# Patient Record
Sex: Female | Born: 1990 | Race: Asian | Hispanic: No | Marital: Married | Smoking: Never smoker
Health system: Southern US, Community
[De-identification: ages and names within clinical notes are randomized; demographics above are authoritative.]

## PROBLEM LIST (undated history)

## (undated) DIAGNOSIS — K5120 Ulcerative (chronic) proctitis without complications: Secondary | ICD-10-CM

## (undated) DIAGNOSIS — K529 Noninfective gastroenteritis and colitis, unspecified: Secondary | ICD-10-CM

## (undated) DIAGNOSIS — G5601 Carpal tunnel syndrome, right upper limb: Secondary | ICD-10-CM

## (undated) DIAGNOSIS — R51 Headache: Secondary | ICD-10-CM

## (undated) DIAGNOSIS — N926 Irregular menstruation, unspecified: Secondary | ICD-10-CM

## (undated) DIAGNOSIS — L709 Acne, unspecified: Secondary | ICD-10-CM

## (undated) HISTORY — PX: OTHER SURGICAL HISTORY: SHX169

## (undated) HISTORY — DX: Acne, unspecified: L70.9

## (undated) HISTORY — DX: Ulcerative (chronic) proctitis without complications: K51.20

## (undated) HISTORY — DX: Irregular menstruation, unspecified: N92.6

## (undated) HISTORY — DX: Headache: R51

## (undated) HISTORY — DX: Noninfective gastroenteritis and colitis, unspecified: K52.9

## (undated) HISTORY — DX: Carpal tunnel syndrome, right upper limb: G56.01

---
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
## 2008-12-09 ENCOUNTER — Ambulatory Visit: Payer: Self-pay | Admitting: Internal Medicine

## 2008-12-09 DIAGNOSIS — R079 Chest pain, unspecified: Secondary | ICD-10-CM

## 2009-03-11 ENCOUNTER — Telehealth: Payer: Self-pay | Admitting: *Deleted

## 2009-09-16 ENCOUNTER — Ambulatory Visit: Payer: Self-pay | Admitting: Internal Medicine

## 2010-03-16 ENCOUNTER — Observation Stay (HOSPITAL_COMMUNITY)
Admission: AD | Admit: 2010-03-16 | Discharge: 2010-03-18 | Payer: Self-pay | Source: Home / Self Care | Admitting: Obstetrics and Gynecology

## 2010-04-24 ENCOUNTER — Inpatient Hospital Stay (HOSPITAL_COMMUNITY)
Admission: AD | Admit: 2010-04-24 | Discharge: 2010-04-27 | Payer: Self-pay | Source: Home / Self Care | Attending: Obstetrics & Gynecology | Admitting: Obstetrics & Gynecology

## 2010-06-13 NOTE — Assessment & Plan Note (Signed)
Summary: ?pregnancy/cjr   Vital Signs:  Patient profile:   20 year old female Menstrual status:  irregular LMP:     03/12/2009 Height:      59 inches Weight:      126 pounds BMI:     25.54 Pulse rate:   65 / minute BP sitting:   110 / 70  (right arm) Cuff size:   regular  Vitals Entered By: Sherron Monday, CMA (AAMA) (Sep 16, 2009 3:09 PM) CC: Postive home pg test., + nausea LMP (date): 03/12/2009 LMP - Character: normal Menarche (age onset years): 10   Menses interval (days): 2-3 months Menstrual flow (days): 5 Enter LMP: 03/12/2009   History of Present Illness: Cheryl Walters comes  in comes in today  with partner Lanny Hurst  for above .  LMP end of October but has very irregular periods  often q 3 months .   Last was on OCPs   2009 and stopped ? because .    IN long term relationship without Contraception.   Has had 1 monthsof breast tenderness   on day of spot and did pregnancy test  in th past week that was positive.   ? how far along the preg is .Hasnt seen an ob yet but  continuing pregnancy.  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 no cv pulm signs    Preventive Screening-Counseling & Management  Alcohol-Tobacco     Alcohol drinks/day: 0     Smoking Status: never  Caffeine-Diet-Exercise     Caffeine use/day: 1     Does Patient Exercise: no  Current Medications (verified): 1)  None  Allergies (verified): No Known Drug Allergies  Past History:  Past medical, surgical, family and social histories (including risk factors) reviewed, and no changes noted (except as noted below).  Past Medical History: Reviewed history from 12/09/2008 and no changes required. HAs Pilonidal cyst. Acne  Irreg periods   Past History:  Care Management: None Current  Family History: Reviewed history from 12/09/2008 and no changes required. allergies  DM  irreg periods mom and sib   Social History: Reviewed  history from 12/09/2008 and no changes required.   ..  [employer] .  second year level no tobacco ocass etoh  ocass caffeine.  Living  at home hhof 4  Played basket ball in HS and no Cv pulm signs   Review of Systems  The patient denies anorexia, fever, weight loss, weight gain, vision loss, syncope, prolonged cough, abdominal pain, melena, hematochezia, hematuria, incontinence, genital sores, difficulty walking, depression, and enlarged lymph nodes.    Physical Exam  General:  alert, well-developed, well-nourished, and well-hydrated.   Head:  normocephalic and atraumatic.   Neck:  No deformities, masses, or tenderness noted. Lungs:  Normal respiratory effort, chest expands symmetrically. Lungs are clear to auscultation, no crackles or wheezes. Heart:  Normal rate and regular rhythm. S1 and S2 normal without gallop, murmur, click, rub or other extra sounds. Abdomen:  soft, non-tender, normal bowel sounds, no masses, no guarding, no rigidity,  and no splenomegaly.  dont feel  uterus on abdominal  exam.Or at least at most at pubic bone. Pulses:  nl cap refill  Neurologic:  non focal  Cervical Nodes:  No lymphadenopathy noted Psych:  Oriented X3, normally interactive, good eye contact, not anxious appearing, and not depressed appearing.     Impression & Recommendations:  Problem # 1:  IRREGULAR MENSTRUAL CYCLE (ICD-626.4) hx of same and   off ocps. Orders: Urine Pregnancy Test  (11643)  Problem # 2:  PREGNANCY (ICD-V22.2) Assessment: New confirmed       unsure of dates because of very irreg menses   . disc "contraception failure "  with irreg menses.   ok to do exercise as she would  in past   .      need to see  Ob to assess dates by [place] counseled   prenatal vitamins  with folic acid    no othermeds  ... call us if   need  help with   establishing with ob  greater than 50% of visit spent in counseling  25 minutes. Laboratory Results   Urine Tests      Urine HCG:  positive Comments: Joyce Gross  Sep 16, 2009 3:20 PM

## 2010-07-25 LAB — CBC
HCT: 24.5 % — ABNORMAL LOW (ref 36.0–46.0)
HCT: 37.1 % (ref 36.0–46.0)
HCT: 31.8 % — ABNORMAL LOW (ref 36.0–46.0)
Hemoglobin: 11.9 g/dL — ABNORMAL LOW (ref 12.0–15.0)
Hemoglobin: 8.2 g/dL — ABNORMAL LOW (ref 12.0–15.0)
Hemoglobin: 10.4 g/dL — ABNORMAL LOW (ref 12.0–15.0)
MCH: 26.1 pg (ref 26.0–34.0)
MCH: 26.6 pg (ref 26.0–34.0)
MCHC: 32.8 g/dL (ref 30.0–36.0)
MCV: 78.3 fL (ref 78.0–100.0)
MCV: 78.5 fL (ref 78.0–100.0)
MCV: 81 fL (ref 78.0–100.0)
RBC: 3.13 MIL/uL — ABNORMAL LOW (ref 3.87–5.11)
RBC: 4.73 MIL/uL (ref 3.87–5.11)
RDW: 17.6 % — ABNORMAL HIGH (ref 11.5–15.5)
WBC: 16.8 10*3/uL — ABNORMAL HIGH (ref 4.0–10.5)

## 2010-07-25 LAB — COMPREHENSIVE METABOLIC PANEL
ALT: 11 U/L (ref 0–35)
CO2: 23 mEq/L (ref 19–32)
Calcium: 9.4 mg/dL (ref 8.4–10.5)
Creatinine, Ser: 0.62 mg/dL (ref 0.4–1.2)
GFR calc non Af Amer: >60 mL/min (ref >60)
Glucose, Bld: 87 mg/dL (ref 70–99)
Total Bilirubin: 0.4 mg/dL (ref 0.3–1.2)

## 2010-07-25 LAB — DIFFERENTIAL
Basophils Absolute: 0 10*3/uL (ref 0.0–0.1)
Eosinophils Absolute: 0.1 10*3/uL (ref 0.0–0.7)
Lymphocytes Relative: 29 % (ref 12–46)
Lymphs Abs: 3.2 10*3/uL (ref 0.7–4.0)
Neutrophils Relative %: 59 % (ref 43–77)

## 2010-07-25 LAB — URINE CULTURE
Colony Count: 50000
Culture  Setup Time: 201111032117

## 2010-07-25 LAB — STREP B DNA PROBE

## 2010-07-25 LAB — GC/CHLAMYDIA PROBE AMP, URINE: GC Probe Amp, Urine: NEGATIVE

## 2010-09-22 ENCOUNTER — Encounter: Payer: Self-pay | Admitting: Internal Medicine

## 2010-09-26 ENCOUNTER — Ambulatory Visit: Payer: Self-pay | Admitting: Internal Medicine

## 2010-10-06 ENCOUNTER — Ambulatory Visit (INDEPENDENT_AMBULATORY_CARE_PROVIDER_SITE_OTHER): Payer: BC Managed Care – PPO | Admitting: Internal Medicine

## 2010-10-06 ENCOUNTER — Encounter: Payer: Self-pay | Admitting: Internal Medicine

## 2010-10-06 VITALS — BP 108/72 | HR 86 | Temp 98.6°F | Wt 155.0 lb

## 2010-10-06 DIAGNOSIS — Z3009 Encounter for other general counseling and advice on contraception: Secondary | ICD-10-CM

## 2010-10-06 DIAGNOSIS — Z30011 Encounter for initial prescription of contraceptive pills: Secondary | ICD-10-CM

## 2010-10-06 MED ORDER — NORETHIN ACE-ETH ESTRAD-FE 1-20 MG-MCG PO TABS: 1.0000 | ORAL_TABLET | Freq: Every day | ORAL | Status: DC

## 2010-10-06 NOTE — Progress Notes (Signed)
  Subjective:    Patient ID: Cheryl Walters, female    DOB: 1990-11-13, 20 y.o.   MRN: [id number]  HPI Patient comes in today about oral contraception. She had childbirth in January is interested in going back on an birth control pill. In the past she  Used yasmin  . No problems with this.    Just got off menses.   Has had 2 periods post partum.   Interested in progesterone implants. But hasn't been able to time this right.to get this procedure.  No tobacco, no history of blood clots, unusual chest pain ,shortness of breath.   Review of Systems New changes in vision unusual rashes leg swelling breast tenderness. Her baby is being bottle fed. Her periods had been a regular in the past. She denies contraceptive failure.    Objective:   Physical Exam Well-developed well-nourished in no acute distress holding her baby Neck no thyromegaly or masses Chest:  Clear to A&P without wheezes rales or rhonchi CV:  S1-S2 no gallops or murmurs peripheral perfusion is normal     Assessment & Plan:  Contraceptive need. Acceptable candidate for combined OCP's. Reviewed the data about the very slight increased risk of clotting on newer progesterone's. Counseled. rov in 2-3 months for bp check etc.   She can't time this at the same time with her well-child check for her infant. She's not interested in the IUD but still may get the progesterone implant in the meantime. Reviewed importance of taking medication I regular basis same time and day.  Total visit 103mns > 50% spent counseling and coordinating care

## 2010-10-07 ENCOUNTER — Encounter: Payer: Self-pay | Admitting: Internal Medicine

## 2010-10-07 DIAGNOSIS — Z30011 Encounter for initial prescription of contraceptive pills: Secondary | ICD-10-CM | POA: Insufficient documentation

## 2010-10-20 ENCOUNTER — Telehealth: Payer: Self-pay | Admitting: *Deleted

## 2010-10-20 NOTE — Telephone Encounter (Signed)
Pt states that when she started the OCP's she was having burning upon urination but after stopping them the burning stopped. Pt is not having any symptoms now. She is wondering if we could change the OCP's.

## 2010-10-20 NOTE — Telephone Encounter (Signed)
Pt states that she was not having any other symptoms now.

## 2010-10-20 NOTE — Telephone Encounter (Signed)
It would be extremely unusual for her symptoms short lived to be from the birth control pill.  However if she wants [place] to change it we can.  Aviane  28 days ( Or equivalent ) 1 po qd  Disp  1 refill x 5   Call us with issues  If she has recurrent dysuria we should see her and check for UTI.

## 2010-10-23 NOTE — Telephone Encounter (Signed)
Left message to call back  

## 2010-10-24 MED ORDER — LEVONORGESTREL-ETHINYL ESTRAD 0.1-20 MG-MCG PO TABS: 1.0000 | ORAL_TABLET | Freq: Every day | ORAL | Status: DC

## 2010-10-24 NOTE — Telephone Encounter (Signed)
Pt aware and wants a new rx called in. Pt states that when she d/c her pills the symptoms stopped.

## 2011-02-14 ENCOUNTER — Ambulatory Visit (INDEPENDENT_AMBULATORY_CARE_PROVIDER_SITE_OTHER): Payer: BC Managed Care – PPO | Admitting: Family Medicine

## 2011-02-14 ENCOUNTER — Encounter: Payer: Self-pay | Admitting: Family Medicine

## 2011-02-14 DIAGNOSIS — D179 Benign lipomatous neoplasm, unspecified: Secondary | ICD-10-CM

## 2011-02-14 NOTE — Progress Notes (Signed)
  Subjective:    Patient ID: Cheryl Walters, female    DOB: May 21, 1990, 20 y.o.   MRN: [id number]  HPI Here to talk about a swollen area on the left lower chest which she has noticed for a year or so. Occasionally this becomes painful, as it did yesterday. When this happens it lasts for only minutes or hours and then goes away. No recent trauma. No SOB.    Review of Systems  Constitutional: Negative.   Respiratory: Negative.   Cardiovascular: Positive for chest pain. Negative for palpitations and leg swelling.       Objective:   Physical Exam  Constitutional: She appears well-developed and well-nourished.  Cardiovascular: Normal rate, regular rhythm, normal heart sounds and intact distal pulses.   Pulmonary/Chest: Effort normal and breath sounds normal. No respiratory distress. She has no wheezes. She has no rales. She exhibits no tenderness.       There is a small mobile non-tender lipoma on the left chest over the lower anterior ribs           Assessment & Plan:  Reassured this is benign. The next time is becomes painful she will try Motrin. Otherwise follow up here

## 2011-02-19 ENCOUNTER — Telehealth: Payer: Self-pay | Admitting: Internal Medicine

## 2011-02-19 NOTE — Telephone Encounter (Signed)
Pt states that pain is worse. Pt has taken 667m of motrin which is not helping. There is a small mobile non-tender lipoma on the left chest over the lower anterior ribs per Dr. FBarbie Bannernote. No redness. Pt states that it is one time as big as it was when she was in here. Sometimes it hurts around it and sometimes hurt where it is swollen.

## 2011-02-19 NOTE — Telephone Encounter (Signed)
Saw Dr fry last week about your chest. Wants to speak with [facility] only.

## 2011-02-20 ENCOUNTER — Telehealth: Payer: Self-pay | Admitting: Internal Medicine

## 2011-02-20 ENCOUNTER — Ambulatory Visit: Payer: BC Managed Care – PPO | Admitting: Family Medicine

## 2011-02-20 NOTE — Telephone Encounter (Signed)
To see provider oct 9th

## 2011-02-20 NOTE — Telephone Encounter (Signed)
Dr. Sarajane Jews will discuss when pt gets here for her office visit.

## 2011-02-20 NOTE — Telephone Encounter (Signed)
Pt has cx her appt for this a.m. Pt is req to get an order for chest xray today. Pls call.

## 2011-02-20 NOTE — Telephone Encounter (Signed)
Pt requesting you contact her. She saw Dr. Sarajane Jews last week and is still having the same issue. Pt has another appt today at 12:30 and is concerned she will not make it to the other appt and wanted to know since she was seen for the same reason last week if Dr. Sarajane Jews could possibly refer her somewhere for and x-ray or scan. Pt appt is today at 10:30 please contact pt

## 2011-02-21 ENCOUNTER — Other Ambulatory Visit (INDEPENDENT_AMBULATORY_CARE_PROVIDER_SITE_OTHER): Payer: Self-pay | Admitting: General Surgery

## 2011-02-23 ENCOUNTER — Encounter (INDEPENDENT_AMBULATORY_CARE_PROVIDER_SITE_OTHER): Payer: Self-pay | Admitting: General Surgery

## 2011-02-23 ENCOUNTER — Ambulatory Visit (INDEPENDENT_AMBULATORY_CARE_PROVIDER_SITE_OTHER): Payer: BC Managed Care – PPO | Admitting: General Surgery

## 2011-02-23 VITALS — BP 116/77 | HR 68 | Temp 97.8°F | Resp 12 | Ht 58.5 in | Wt 144.8 lb

## 2011-02-23 DIAGNOSIS — D1739 Benign lipomatous neoplasm of skin and subcutaneous tissue of other sites: Secondary | ICD-10-CM

## 2011-02-23 DIAGNOSIS — D171 Benign lipomatous neoplasm of skin and subcutaneous tissue of trunk: Secondary | ICD-10-CM

## 2011-02-23 NOTE — Patient Instructions (Signed)
Call us at [phone] if you decide you would like to have surgery.  Lipoma A lipoma is a noncancerous (benign) tumor composed of fat cells. They are usually found under the skin (subcutaneous). A lipoma may occur in any tissue of the body that contains fat. Common areas for lipomas to appear include the back, shoulders, buttocks, and thighs. Lipomas are a very common soft tissue growth. They are soft and grow slowly. Most problems caused by a lipoma depend on where it is growing. DIAGNOSIS A lipoma can be diagnosed with a physical exam. These tumors rarely become cancerous, but radiographic studies can help determine this for certain. Studies used may include:  Computerized X-ray scans (CT or CAT scan).   Computerized magnetic scans (MRI).  TREATMENT Small lipomas that are not causing problems may be watched. If a lipoma continues to enlarge or causes problems, removal is often the best treatment. Lipomas can also be removed to improve appearance. Surgery is done to remove the fatty cells and the surrounding capsule. Most often, this is done with medicine that numbs the area (local anesthetic). The removed tissue is examined under a microscope to make sure it is not cancerous. Keep all follow-up appointments with your caregiver. SEEK MEDICAL CARE IF:  The lipoma becomes larger or hard.   The lipoma becomes painful, red, or increasingly swollen. These could be signs of infection or a more serious condition.  Document Released: 04/20/2002 Document Re-Released: 10/18/2009 [facility] Patient Information 2011 [place].

## 2011-02-23 NOTE — Progress Notes (Signed)
Chief Complaint  Patient presents with  . Cyst    lump on chest    HPI Cheryl Walters is a 20 y.o. female.  HPI  20 year old [place] American female referred by her OB/GYN for evaluation of a left upper abdominal subcutaneous lesion. The patient states that she noticed a bump underneath her skin several weeks ago. She thinks that it is slowly increasing in size. It does cause her discomfort in that area. It does not radiate. The discomfort will occur at random times. It will last for seconds to minutes. She describes it as a sharp sensation. She denies any fevers, chills, night sweats, weight loss, or injury to the area. She denies any redness or drainage from the area. She denies any family history of soft tissue cancer.  Past Medical History  Diagnosis Date  . Headache   . Pilonidal cyst   . Acne   . Irregular periods     Past Surgical History  Procedure Date  . Cesarean section     Family History  Problem Relation Age of Onset  . Other Mother     irreg periods  . Other Sister     irreg periods  . Cancer Maternal Aunt     breast  . Cancer Paternal Aunt     brain    Social History History  Substance Use Topics  . Smoking status: Never Smoker   . Smokeless tobacco: Never Used  . Alcohol Use: Yes    No Known Allergies  Current Outpatient Prescriptions  Medication Sig Dispense Refill  . Etonogestrel (IMPLANON) 68 MG IMPL Inject into the skin.        . cephALEXin (KEFLEX) 500 MG capsule       . levonorgestrel-ethinyl estradiol (AVIANE,ALESSE,LESSINA) 0.1-20 MG-MCG per tablet Take 1 tablet by mouth daily.  28 tablet  5  . norethindrone-ethinyl estradiol (JUNEL FE 1/20) 1-20 MG-MCG per tablet Take 1 tablet by mouth daily.  28 tablet  5    Review of Systems Review of Systems  Constitutional: Negative for fever, activity change, fatigue and unexpected weight change.  HENT: Negative for nosebleeds, congestion and neck pain.   Eyes: Negative for photophobia and  visual disturbance.  Respiratory: Negative for cough, chest tightness, shortness of breath and wheezing.   Cardiovascular: Positive for chest pain (lower left chest wall pain, reprodcuible on palpation). Negative for palpitations and leg swelling.  Gastrointestinal: Negative for nausea, vomiting, abdominal pain and diarrhea.  Genitourinary: Negative for dysuria, frequency, hematuria and vaginal discharge.  Musculoskeletal: Negative.   Neurological: Negative.   Hematological: Negative.   Psychiatric/Behavioral: Negative.     Blood pressure 116/77, pulse 68, temperature 97.8 F (36.6 C), temperature source Temporal, resp. rate 12, height 4' 10.5" (1.486 m), weight 144 lb 12.8 oz (65.681 kg), last menstrual period 02/05/2011.  Physical Exam Physical Exam  Vitals reviewed. Constitutional: She is oriented to person, place, and time. She appears well-developed and well-nourished.  HENT:  Head: Normocephalic and atraumatic.  Eyes: Conjunctivae are normal. No scleral icterus.  Neck: Neck supple. No tracheal deviation present. No thyromegaly present.  Cardiovascular: Normal rate, regular rhythm and normal heart sounds.   Pulmonary/Chest: Effort normal and breath sounds normal. No respiratory distress. She has no wheezes.  Abdominal: Soft. Bowel sounds are normal. She exhibits no distension. There is no tenderness. There is no guarding.         Subtle subcu mass somewhat indistinct borders on L upper abd beneath subcostal margin. No skin changes.  About 2.5 cm x 2cm. Mildly TTP  Musculoskeletal: Normal range of motion.  Lymphadenopathy:    She has no cervical adenopathy.    She has no axillary adenopathy.       Right: No supraclavicular adenopathy present.       Left: No supraclavicular adenopathy present.  Neurological: She is alert and oriented to person, place, and time. She exhibits normal muscle tone.  Skin: Skin is warm and dry.  Psychiatric: She has a normal mood and affect. Her  behavior is normal. Thought content normal.    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 We discussed the etiology and management of lipomas. We discussed observation versus surgical excision. We discussed the risk and benefits of surgery including but not limited to bleeding, infection, injury to surrounding structures, hematoma formation, seroma formation, scarring, blood clot formation, and anesthesia risks. We discussed the typical post operative recovery course. I explained to the patient that I think there is a less than 1% chance of this having a malignancy. However on the rare chance that this is a malignant lesion it may require additional surgery.  I explained to the patient that she should expect moderate improvement in her symptoms with the surgery.  She is going to think about it and call us with her decision of whether or not she would like to proceed to the operating room.    Leighton Ruff. Redmond Pulling, MD, FACS   Greer Pickerel M 02/23/2011, 11:22 AM

## 2011-02-28 LAB — RAPID STREP SCREEN (MED CTR MEBANE ONLY): Streptococcus, Group A Screen (Direct): POSITIVE — AB

## 2011-03-23 ENCOUNTER — Other Ambulatory Visit (INDEPENDENT_AMBULATORY_CARE_PROVIDER_SITE_OTHER): Payer: Self-pay | Admitting: General Surgery

## 2011-03-27 ENCOUNTER — Telehealth (INDEPENDENT_AMBULATORY_CARE_PROVIDER_SITE_OTHER): Payer: Self-pay | Admitting: General Surgery

## 2011-03-29 ENCOUNTER — Other Ambulatory Visit (INDEPENDENT_AMBULATORY_CARE_PROVIDER_SITE_OTHER): Payer: Self-pay | Admitting: General Surgery

## 2011-03-29 NOTE — Telephone Encounter (Signed)
Dr Redmond Pulling writing orders. Taking to Amy to schedule and contact patient.

## 2011-08-13 ENCOUNTER — Ambulatory Visit (INDEPENDENT_AMBULATORY_CARE_PROVIDER_SITE_OTHER): Payer: Managed Care, Other (non HMO) | Admitting: Internal Medicine

## 2011-08-13 ENCOUNTER — Encounter: Payer: Self-pay | Admitting: Internal Medicine

## 2011-08-13 VITALS — BP 100/90 | HR 99 | Temp 98.7°F | Wt 150.0 lb

## 2011-08-13 DIAGNOSIS — R079 Chest pain, unspecified: Secondary | ICD-10-CM

## 2011-08-13 DIAGNOSIS — R51 Headache: Secondary | ICD-10-CM

## 2011-08-13 DIAGNOSIS — J310 Chronic rhinitis: Secondary | ICD-10-CM | POA: Insufficient documentation

## 2011-08-13 DIAGNOSIS — G4726 Circadian rhythm sleep disorder, shift work type: Secondary | ICD-10-CM

## 2011-08-13 MED ORDER — FLUTICASONE PROPIONATE 50 MCG/ACT NA SUSP: 2.0000 | Freq: Every day | NASAL | Status: DC

## 2011-08-13 NOTE — Progress Notes (Signed)
  Subjective:    Patient ID: Cheryl Walters, female    DOB: October 14, 1990, 21 y.o.   MRN: [id number]  HPI  Patient comes in today for SDA for  new problem evaluation.  onset a week ago and got left side chest pain left side off and on.  At times  Under left breast.  Throbbing chest pain  sometimes  Worse  Other time.   nofever cough  .   noradiation no nausea. But last night had ha and chest hurting   No sob leg swelling fever  . Gets has  Unrelated to above. No  weakness Right handed .  Has to lift heavy child at home  But no injury. Review of Systems NO fever cough uri. No nausea vomitng or diarrhea.   On implanon .   Outpatient Prescriptions Prior to Visit  Medication Sig Dispense Refill  . Etonogestrel (IMPLANON) 68 MG IMPL Inject 1 each into the skin once. Placed in summer of 2012      . cephALEXin (KEFLEX) 500 MG capsule       . levonorgestrel-ethinyl estradiol (AVIANE,ALESSE,LESSINA) 0.1-20 MG-MCG per tablet Take 1 tablet by mouth daily.  28 tablet  5  . norethindrone-ethinyl estradiol (JUNEL FE 1/20) 1-20 MG-MCG per tablet Take 1 tablet by mouth daily.  28 tablet  5   Past history family history social history reviewed in the electronic medical record.     Objective:   Physical Exam Physical Exam: Vital signs reviewed JAS:NKNL is a well-developed well-nourished alert cooperative  female who appears her stated age in no acute distress.  HEENT: normocephalic atraumatic , Eyes: PERRL EOM's full, conjunctiva clear, Nares: paten,t no deformity discharge or tenderness congested ., Ears: no deformity EAC's clear TMs with normal landmarks. Mouth: clear OP, no lesions, edema.  Moist mucous membranes. Dentition in adequate repair. NECK: supple without masses, thyromegaly or bruits. CHEST/PULM:  Clear to auscultation and percussion breath sounds equal no wheeze , rales or rhonchi. No chest wall deformities intermittent tenderness left parasternal area  No crepitus   CV: PMI is  nondisplaced, S1 S2 no gallops, murmurs, rubs. Peripheral pulses are full without delay.No JVD .  ABDOMEN: Bowel sounds normal nontender  No guard or rebound, no hepato splenomegal no CVA tenderness.   SKIN: No acute rashes normal turgor, color, no bruising or petechiae. PSYCH: Oriented, good eye contact, no obvious depression anxiety, cognition and judgment appear normal. LN: no cervical axillary i adenopathy  EKG nsr      Assessment & Plan:   Chest  Pain  POss cwp  Atypical    Not on estrogen no hx of clotting    Headaches   Problematic  Tracking no alarm features  Just started night shift and may be contributing  Nasal stiffiness  Ongoing poss allergic  ? If related to  Has or other

## 2011-08-13 NOTE — Patient Instructions (Signed)
This could be chest wall pain  .  Headaches: track the headaches  Poss sleep and excess caffeine can contribute.  You may have allergies also.  Trial flonase every day for at least 1-2 weeks to see if helps nose congestion and headaches.   Headache and Allergies The relationship between allergies and headaches is unclear. Many people with allergic or infectious nasal problems also have headaches (migraines or sinus headaches). However, sometimes allergies can cause pressure that feels like a headache, and sometimes headaches can cause allergy-like symptoms. It is not always clear whether your symptoms are caused by allergies or by a headache. CAUSES   Migraine: The cause of a migraine is not always known.   Sinus Headache: The cause of a sinus headache may be a sinus infection. Other conditions that may be related to sinus headaches include:   Hay fever (allergic rhinitis).   Deviation of the nasal septum.   Swelling or clogging of the nasal passages.  SYMPTOMS  Migraine headache symptoms (which often last 4 to 72 hours) include:  Intense, throbbing pain on one or both sides of the head.   Nausea.   Vomiting.   Being extra sensitive to light.   Being extra sensitive to sound.   Nervous system reactions that appear similar to an allergic reaction:   Stuffy nose.   Runny nose.   Tearing.  Sinus headaches are felt as facial pain or pressure.  DIAGNOSIS  Because there is some overlap in symptoms, sinus and migraine headaches are often misdiagnosed. For example, a person with migraines may also feel facial pressure. Likewise, many people with hay fever may get migraine headaches rather than sinus headaches. These migraines can be triggered by the histamine release during an allergic reaction. An antihistamine medicine can eliminate this pain. There are standard criteria that help clarify the difference between these headaches and related allergy or allergy-like symptoms. Your  caregiver can use these criteria to determine the proper diagnosis and provide you the best care. TREATMENT  Migraine medicine may help people who have persistent migraine headaches even though their hay fever is controlled. For some people, anti-inflammatory treatments do not work to relieve migraines. Medicines called triptans (such as sumatriptan) can be helpful for those people. Document Released: 07/21/2003 Document Revised: 04/19/2011 Document Reviewed: 08/12/2009 [facility] Patient Information 2012 [place].Chest Wall Pain Chest wall pain is pain in or around the bones and muscles of your chest. It may take up to 6 weeks to get better. It may take longer if you must stay physically active in your work and activities.  CAUSES  Chest wall pain may happen on its own. However, it may be caused by:  A viral illness like the flu.   Injury.   Coughing.   Exercise.   Arthritis.   Fibromyalgia.   Shingles.  HOME CARE INSTRUCTIONS   Avoid overtiring physical activity. Try not to strain or perform activities that cause pain. This includes any activities using your chest or your abdominal and side muscles, especially if heavy weights are used.   Put ice on the sore area.   Put ice in a plastic bag.   Place a towel between your skin and the bag.   Leave the ice on for 15 to 20 minutes per hour while awake for the first 2 days.   Only take over-the-counter or prescription medicines for pain, discomfort, or fever as directed by your caregiver.  SEEK IMMEDIATE MEDICAL CARE IF:   Your pain increases,  or you are very uncomfortable.   You have a fever.   Your chest pain becomes worse.   You have new, unexplained symptoms.   You have nausea or vomiting.   You feel sweaty or lightheaded.   You have a cough with phlegm (sputum), or you cough up blood.  MAKE SURE YOU:   Understand these instructions.   Will watch your condition.   Will get help right away if you are not  doing well or get worse.  Document Released: 04/30/2005 Document Revised: 04/19/2011 Document Reviewed: 12/25/2010 [facility] Patient Information 2012 Cheryl Walters.

## 2011-08-14 ENCOUNTER — Other Ambulatory Visit: Payer: Self-pay

## 2011-08-14 ENCOUNTER — Emergency Department (HOSPITAL_COMMUNITY)
Admission: EM | Admit: 2011-08-14 | Discharge: 2011-08-15 | Disposition: A | Discharge status: Home / Self Care | Payer: Managed Care, Other (non HMO) | Attending: Emergency Medicine | Admitting: Emergency Medicine

## 2011-08-14 ENCOUNTER — Encounter (HOSPITAL_COMMUNITY): Payer: Self-pay | Admitting: *Deleted

## 2011-08-14 DIAGNOSIS — R079 Chest pain, unspecified: Secondary | ICD-10-CM | POA: Insufficient documentation

## 2011-08-14 DIAGNOSIS — R0989 Other specified symptoms and signs involving the circulatory and respiratory systems: Secondary | ICD-10-CM | POA: Insufficient documentation

## 2011-08-14 DIAGNOSIS — R0609 Other forms of dyspnea: Secondary | ICD-10-CM | POA: Insufficient documentation

## 2011-08-14 NOTE — ED Notes (Signed)
The pt has had generalized chest pain for 2 days.  She saw her doctor yesterday for the same.  No caridac problem

## 2011-08-15 ENCOUNTER — Emergency Department (HOSPITAL_COMMUNITY): Payer: Managed Care, Other (non HMO)

## 2011-08-15 MED ORDER — TRAMADOL HCL 50 MG PO TABS: 50.0000 mg | ORAL_TABLET | Freq: Four times a day (QID) | ORAL | Status: AC | PRN

## 2011-08-15 NOTE — Discharge Instructions (Signed)
Take ultram as needed for severe pain.  Do not drive within four hours of taking this medication (may cause drowsiness or confusion).  Continue aleve as well.  Avoid activities that aggravate pain.  You should follow up with your doctor or return to the ER if your pain worsens or changes in a way that is concerning to you.

## 2011-08-15 NOTE — ED Notes (Signed)
Patient transported to X-ray 

## 2011-08-15 NOTE — ED Provider Notes (Signed)
History     CSN: [id number]  Arrival date & time 08/14/11  2146   First MD Initiated Contact with Patient 08/14/11 2321      Chief Complaint  Patient presents with  . Chest Pain    (Consider location/radiation/quality/duration/timing/severity/associated sxs/prior treatment) HPI History provided by pt.   Pt has had chest pain since yesterday.  Location is variable; may occur at sternum, left lateral chest or right lateral chest.  Non-exertional, non-positional and not related to po intake.  Episodes usually last for a minimum of 5 minutes before resolving spontaneously.  No associated SOB but had a couple episodes of dyspnea at rest yesterday evening.  No recent trauma and no h/o acid reflux.  No RF for PE and denies LE pain/edema.  No FH early MI.  Does not smoke cigarettes.   Past Medical History  Diagnosis Date  . Headache   . Pilonidal cyst   . Acne   . Irregular periods     Past Surgical History  Procedure Date  . Cesarean section     Family History  Problem Relation Age of Onset  . Other Mother     irreg periods  . Other Sister     irreg periods  . Cancer Maternal Aunt     breast  . Cancer Paternal Aunt     brain    History  Substance Use Topics  . Smoking status: Never Smoker   . Smokeless tobacco: Never Used  . Alcohol Use: Yes    OB History    Grav Para Term Preterm Abortions TAB SAB Ect Mult Living                  Review of Systems  All other systems reviewed and are negative.    Allergies  Review of patient's allergies indicates no known allergies.  Home Medications   Current Outpatient Rx  Name Route Sig Dispense Refill  . ETONOGESTREL 68 MG [place] IMPL Subcutaneous Inject 1 each into the skin once. Placed in summer of 2012      BP 106/72  Pulse 75  Temp(Src) 98.7 F (37.1 C) (Oral)  Resp 20  SpO2 100%  LMP 05/23/2011  Physical Exam  Nursing note and vitals reviewed. Constitutional: She is oriented to person, place, and time. She  appears well-developed and well-nourished. No distress.  HENT:  Head: Normocephalic and atraumatic.  Eyes:       Normal appearance  Neck: Normal range of motion.  Cardiovascular: Normal rate, regular rhythm and intact distal pulses.   Pulmonary/Chest: Effort normal and breath sounds normal. No respiratory distress. She exhibits no tenderness.  Abdominal: Soft. Bowel sounds are normal. She exhibits no distension. There is no tenderness. There is no guarding.  Musculoskeletal: Normal range of motion.       No peripheral edema or calf tenderness  Neurological: She is alert and oriented to person, place, and time.  Skin: Skin is warm and dry. No rash noted.  Psychiatric: She has a normal mood and affect. Her behavior is normal.    ED Course  Procedures (including critical care time)   Date: 08/15/2011  Rate: 70  Rhythm: normal sinus rhythm  QRS Axis: right  Intervals: normal  ST/T Wave abnormalities: normal  Conduction Disutrbances:none  Narrative Interpretation:   Old EKG Reviewed: none available   Labs Reviewed - No data to display Dg Chest 2 View  08/15/2011  *RADIOLOGY REPORT*  Clinical Data: Shortness of breath.  CHEST - 2  VIEW  Comparison: 12/09/2008.  Findings: The cardiac silhouette, mediastinal and hilar contours are within normal limits and stable. The lungs are clear.  No pleural effusions.  The bony thorax is intact. Mild pectus deformity.  IMPRESSION: Normal chest x-ray.  Original Report Authenticated By: P. Kalman Jewels, M.D.     1. Chest pain       MDM  Healthy 21yo F w/ no RF for PE or ACS presents w/ atypical CP x 2d.  Evaluated by her PCP yesterday but pain worsened today.  No acute findings on exam.  EKG shows NSR and is non-ischemic.  CXR neg.  All results discussed w/ pt and she has been reassured that her CP is most likely benign.  Return precautions discussed.         [place], [place] 08/15/11 1158

## 2011-08-15 NOTE — ED Notes (Signed)
Patient is AOx4 and comfortable with her discharge instructions.

## 2011-08-15 NOTE — ED Notes (Signed)
The patient states she has intermittent, sometimes sharp, sometime "pinching" pain on the left side of her chest.

## 2011-08-16 NOTE — ED Provider Notes (Signed)
Medical screening examination/treatment/procedure(s) were conducted as a shared visit with non-physician practitioner(s) and myself.  I personally evaluated the patient during the encounter.  Extremely low risk factor profile for ACS or pulmonary embolus. EKG and chest x-ray nonacute  Nat Christen, MD 08/16/11 0003

## 2011-08-28 ENCOUNTER — Ambulatory Visit: Payer: Managed Care, Other (non HMO) | Admitting: Cardiovascular Disease

## 2012-06-06 ENCOUNTER — Ambulatory Visit (INDEPENDENT_AMBULATORY_CARE_PROVIDER_SITE_OTHER): Payer: BC Managed Care – PPO | Admitting: Family Medicine

## 2012-06-06 DIAGNOSIS — Z111 Encounter for screening for respiratory tuberculosis: Secondary | ICD-10-CM

## 2012-09-09 ENCOUNTER — Ambulatory Visit (INDEPENDENT_AMBULATORY_CARE_PROVIDER_SITE_OTHER): Payer: BC Managed Care – PPO | Admitting: Family Medicine

## 2012-09-09 ENCOUNTER — Encounter: Payer: Self-pay | Admitting: Family Medicine

## 2012-09-09 VITALS — BP 120/70 | Temp 97.9°F | Wt 169.0 lb

## 2012-09-09 DIAGNOSIS — T675XXA Heat exhaustion, unspecified, initial encounter: Secondary | ICD-10-CM

## 2012-09-09 NOTE — Progress Notes (Signed)
Chief Complaint  Patient presents with  . Headache    and feeling hot, dizzy at work    HPI:  Headache: -started today at work - work on [employer] - just started this job last week -gets hot in the building and she felt over heated and started having a headache and felt a little lightheaded -now she is feeling a little better but still hot -she doesn't drink a lot when working -denies: vision changes, nausea, vomiting, syncope, presyncope, fevers, CP, SOB, palpitations -has hx of headaches on a pretty regular basis a few time per month for many years, never dx with migraines, not worse or changed   ROS: See pertinent positives and negatives per HPI.  Past Medical History  Diagnosis Date  . Headache   . Pilonidal cyst   . Acne   . Irregular periods     Family History  Problem Relation Age of Onset  . Other Mother     irreg periods  . Other Sister     irreg periods  . Cancer Maternal Aunt     breast  . Cancer Paternal Aunt     brain    History   Social History  . Marital Status: Single    Spouse Name: N/A    Number of Children: N/A  . Years of Education: N/A   Social History Main Topics  . Smoking status: Never Smoker   . Smokeless tobacco: Never Used  . Alcohol Use: Yes  . Drug Use: No  . Sexually Active: None   Other Topics Concern  . 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    Current outpatient prescriptions:Etonogestrel (IMPLANON) 68 MG IMPL, Inject 1 each into the skin once. Placed in summer of 2012, Disp: , Rfl:   EXAM:  Filed Vitals:   09/09/12 1325  BP: 120/70  Temp: 97.9 F (36.6 C)    Body mass index is 34.72 kg/(m^2).  GENERAL: vitals reviewed and listed above, alert, oriented, appears well hydrated  and in no acute distress  HEENT: atraumatic, conjunttiva clear, no obvious abnormalities on inspection of external nose and ears  NECK: no obvious masses on inspection  LUNGS: clear to auscultation bilaterally, no wheezes, rales or rhonchi, good air movement  CV: HRRR, no peripheral edema  MS: moves all extremities without noticeable abnormality  PSYCH: pleasant and cooperative, no obvious depression or anxiety  ASSESSMENT AND PLAN:  Discussed the following assessment and plan:  Heat exhaustion, initial encounter  -mild Ha, lightheadedness and fatigue at work when got over heated. Feels better now. No alarm features or findings on exam. She has just started a new job on [employer] and is hot and physical work. Advised staying hydrated while at work, breaks, follow up if recurs or persists. She does not like this job and is going to look for another. She will follow up with PCP. -Patient advised to return or notify a doctor immediately if symptoms worsen or persist or new concerns arise.  Patient Instructions  Heat Disorders Heat related disorders are illnesses caused by continued exposure to hot and humid environments, not drinking  enough fluids, and/or your body failing to regulate its temperature correctly. People suffer from heat stress and heat related disorders when their bodies are unable to compensate and cool down through sweating. With sufficient heat, sweating is not enough to keep you cool, and your body temperature can rise quickly. Very high body temperatures can damage your brain and other vital organs. High humidity (moisture in the air), adds to heat stress, because it is harder for sweat to evaporate and cool your body. Heat stress and disorders are not uncommon. Some medicines can increase your risk for heat related illness. Ask your caregiver about your medicines during periods of intense heat.  Heat related disorders include:  Heatstroke. When you cannot sweat or  regulate your body temperature in an adequate way. This is very dangerous and can be life threatening. Get emergency medical help.  Heat exhaustion. Overheating causes heavy sweating and a fast heart rate. Your body can still regulate its own temperature.  Heat cramps. Painful, uncontrollable muscle spasms. Can occur during heavy exercise in hot environments.  Sunburn. Skin becomes red and painful (burned) after being out in the sun.  Heat rash. Sweat ducts become blocked, which traps sweat under the skin. This causes blisters and red bumps and may cause an itchy or tingling feeling. PREVENTING HEAT STRESS AND HEAT RELATED DISORDERS Overheating can be dangerous and life threatening. When exercising, working, or doing other activities in hot and humid environments, do the following:  Stay informed by listening to and watching local broadcast weather and safety updates during intense heat.  Air conditioning is the best way to prevent heat disorders. If your home is not air conditioned, spend time in air conditioned places (malls, [place], or heat shelters set up by your local health department).  Wear light-weight, light colored, loose fitting clothing. Wear as little clothing as possible when at home.  Increase your fluid intake. Drink enough water and fluids to keep your urine clear or pale yellow. DO NOT WAIT UNTIL YOU ARE THIRSTY TO DRINK. You may already be heat stressed, and not recognize it.  If your caregiver has suggested that you limit the amount of fluid you drink or has prescribed water pills for a medical problem, ask how much you should drink when the weather is hot.  Do not drink liquids with alcohol, caffeine, or lots of sugar. They can cause more loss of body fluid.  Heavy sweating drains your body's salt and minerals, which must be replaced. If you must exercise in the heat, a sports beverage can replace the salt and minerals you lose in sweat. If you are on a  low-salt diet, check with your caregiver before drinking a sports beverage.  Sunburn reduces your body's ability to cool itself and causes a loss of needed body fluids. If you go outdoors, protect yourself from the sun by wearing a wide-brimmed hat, along with sunglasses.  Put on sunscreen of SPF 15 or higher, 30 minutes before going out. (The most effective products say "broad spectrum" or "UVA/UVB protection" on the label.) Reapply sunscreen frequently -- at least every 1-2 hours.  Take added precautions when both the heat and humidity are high.  Rest often.  Even young and otherwise healthy people can become heat stressed and suffer from a heat disorder, if they participate in strenuous activities during hot weather.  If you must be outdoors, try going out only during morning and evening hours, when it is cooler. Rest often in shady areas, so  that your body's temperature can adjust.  If your heart pounds or you are gasping for breath, STOP all activity. Go immediately to a cool area, or at least into the shade, and rest. This is especially true if you become lightheaded, confused, weak, or faint.  Electric fans may make you comfortable, but they DO NOT prevent heat related problems. SYMPTOMS   Headache.  Nosebleed.  Weakness.  You feel very hot.  Muscle cramps.  Restlessness.  Fainting or dizziness.  Fast breathing and shortness of breath.  Excessive sweating. (There may be little or no sweating in late stages of heat exhaustion.)  Rapid pulse, heart pounding.  Feeling sick to your stomach (nauseous, vomiting).  Skin becoming cold and clammy, or excessively hot and dry. HOME CARE INSTRUCTIONS   Lie down and rest in a cool or air conditioned area.  Drink enough water and fluids to keep your urine clear or pale yellow. Avoid fluids with caffeine or high sugar content. Avoid coffee, tea, alcohol or stimulants.  Do not take salt tablets, unless advised by your  caregiver.  Avoid hot foods and heavy meals.  Bathe or shower in cool water.  Wear minimal clothing.  Use a fan. Add cool or warm mist to the air, if possible.  If possible, decrease the use of your stove or oven at home.  Monitor adults at risk at least twice a day, watching closely for signs of heat exhaustion or heat stroke. Infants and young children also require more frequent watching.  Never leave infants, children or pets in a parked car, even if the windows are cracked open.  If you are 69 years of age or older, have a friend or relative call to check on you twice a day during a heat wave. If you know someone in this age group, check on them at least twice a day. SEEK IMMEDIATE MEDICAL CARE IF:  You have a hard time breathing.  You vomit or pass blood in your stool.  You have a seizure, feel dizzy or faint, or pass out.  You develop severe sweating.  Your skin is red, hot and dry (there is no sweating).  Your urine turns a dark color or has blood in it.  You are making very little or no urine.  You are unable to keep fluids down.  You develop chest or abdominal pain.  You develop a throbbing headache.  You develop nausea or confusion. IF YOU OBSERVE SOMEONE WHO MIGHT HAVE HEAT STROKE This can be life threatening. Call your local emergency services (911 in the [place]  If the victim is in the sun, get him or her to a shady area.  Cool the victim rapidly, using whatever methods you have:  Place the victim in a tub of cool water or a cool shower.  Spray the victim with cool water from a garden hose, or sponge the person with cool water.  Wrap the victim in a cool, wet sheet and fan them.  If emergency medical help is delayed, call the hospital emergency room or your local emergency services (911 in the [place] for further instructions.  Sometimes a victim's muscles will begin to twitch from heat stroke. If this happens, keep the victim from injuring himself.  However, do not place any object in the mouth. Give fluids, unless the muscle twitching makes it difficult or unsafe to do so. If there is vomiting, make sure the airway remains open by turning the victim on his or her side.  Document Released: 04/27/2000 Document Revised: 07/23/2011 Document Reviewed: 02/14/2009 [facility] Patient Information 2013 [place], [place], [place]

## 2012-09-09 NOTE — Patient Instructions (Addendum)
Heat Disorders Heat related disorders are illnesses caused by continued exposure to hot and humid environments, not drinking enough fluids, and/or your body failing to regulate its temperature correctly. People suffer from heat stress and heat related disorders when their bodies are unable to compensate and cool down through sweating. With sufficient heat, sweating is not enough to keep you cool, and your body temperature can rise quickly. Very high body temperatures can damage your brain and other vital organs. High humidity (moisture in the air), adds to heat stress, because it is harder for sweat to evaporate and cool your body. Heat stress and disorders are not uncommon. Some medicines can increase your risk for heat related illness. Ask your caregiver about your medicines during periods of intense heat.  Heat related disorders include:  Heatstroke. When you cannot sweat or regulate your body temperature in an adequate way. This is very dangerous and can be life threatening. Get emergency medical help.  Heat exhaustion. Overheating causes heavy sweating and a fast heart rate. Your body can still regulate its own temperature.  Heat cramps. Painful, uncontrollable muscle spasms. Can occur during heavy exercise in hot environments.  Sunburn. Skin becomes red and painful (burned) after being out in the sun.  Heat rash. Sweat ducts become blocked, which traps sweat under the skin. This causes blisters and red bumps and may cause an itchy or tingling feeling. PREVENTING HEAT STRESS AND HEAT RELATED DISORDERS Overheating can be dangerous and life threatening. When exercising, working, or doing other activities in hot and humid environments, do the following:  Stay informed by listening to and watching local broadcast weather and safety updates during intense heat.  Air conditioning is the best way to prevent heat disorders. If your home is not air conditioned, spend time in air conditioned places  (malls, [place], or heat shelters set up by your local health department).  Wear light-weight, light colored, loose fitting clothing. Wear as little clothing as possible when at home.  Increase your fluid intake. Drink enough water and fluids to keep your urine clear or pale yellow. DO NOT WAIT UNTIL YOU ARE THIRSTY TO DRINK. You may already be heat stressed, and not recognize it.  If your caregiver has suggested that you limit the amount of fluid you drink or has prescribed water pills for a medical problem, ask how much you should drink when the weather is hot.  Do not drink liquids with alcohol, caffeine, or lots of sugar. They can cause more loss of body fluid.  Heavy sweating drains your body's salt and minerals, which must be replaced. If you must exercise in the heat, a sports beverage can replace the salt and minerals you lose in sweat. If you are on a low-salt diet, check with your caregiver before drinking a sports beverage.  Sunburn reduces your body's ability to cool itself and causes a loss of needed body fluids. If you go outdoors, protect yourself from the sun by wearing a wide-brimmed hat, along with sunglasses.  Put on sunscreen of SPF 15 or higher, 30 minutes before going out. (The most effective products say "broad spectrum" or "UVA/UVB protection" on the label.) Reapply sunscreen frequently -- at least every 1-2 hours.  Take added precautions when both the heat and humidity are high.  Rest often.  Even young and otherwise healthy people can become heat stressed and suffer from a heat disorder, if they participate in strenuous activities during hot weather.  If you must be outdoors, try going  out only during morning and evening hours, when it is cooler. Rest often in shady areas, so that your body's temperature can adjust.  If your heart pounds or you are gasping for breath, STOP all activity. Go immediately to a cool area, or at least into the shade, and rest.  This is especially true if you become lightheaded, confused, weak, or faint.  Electric fans may make you comfortable, but they DO NOT prevent heat related problems. SYMPTOMS   Headache.  Nosebleed.  Weakness.  You feel very hot.  Muscle cramps.  Restlessness.  Fainting or dizziness.  Fast breathing and shortness of breath.  Excessive sweating. (There may be little or no sweating in late stages of heat exhaustion.)  Rapid pulse, heart pounding.  Feeling sick to your stomach (nauseous, vomiting).  Skin becoming cold and clammy, or excessively hot and dry. HOME CARE INSTRUCTIONS   Lie down and rest in a cool or air conditioned area.  Drink enough water and fluids to keep your urine clear or pale yellow. Avoid fluids with caffeine or high sugar content. Avoid coffee, tea, alcohol or stimulants.  Do not take salt tablets, unless advised by your caregiver.  Avoid hot foods and heavy meals.  Bathe or shower in cool water.  Wear minimal clothing.  Use a fan. Add cool or warm mist to the air, if possible.  If possible, decrease the use of your stove or oven at home.  Monitor adults at risk at least twice a day, watching closely for signs of heat exhaustion or heat stroke. Infants and young children also require more frequent watching.  Never leave infants, children or pets in a parked car, even if the windows are cracked open.  If you are 70 years of age or older, have a friend or relative call to check on you twice a day during a heat wave. If you know someone in this age group, check on them at least twice a day. SEEK IMMEDIATE MEDICAL CARE IF:  You have a hard time breathing.  You vomit or pass blood in your stool.  You have a seizure, feel dizzy or faint, or pass out.  You develop severe sweating.  Your skin is red, hot and dry (there is no sweating).  Your urine turns a dark color or has blood in it.  You are making very little or no urine.  You are  unable to keep fluids down.  You develop chest or abdominal pain.  You develop a throbbing headache.  You develop nausea or confusion. IF YOU OBSERVE SOMEONE WHO MIGHT HAVE HEAT STROKE This can be life threatening. Call your local emergency services (911 in the [place]  If the victim is in the sun, get him or her to a shady area.  Cool the victim rapidly, using whatever methods you have:  Place the victim in a tub of cool water or a cool shower.  Spray the victim with cool water from a garden hose, or sponge the person with cool water.  Wrap the victim in a cool, wet sheet and fan them.  If emergency medical help is delayed, call the hospital emergency room or your local emergency services (911 in the [place] for further instructions.  Sometimes a victim's muscles will begin to twitch from heat stroke. If this happens, keep the victim from injuring himself. However, do not place any object in the mouth. Give fluids, unless the muscle twitching makes it difficult or unsafe to do so. If there  is vomiting, make sure the airway remains open by turning the victim on his or her side. Document Released: 04/27/2000 Document Revised: 07/23/2011 Document Reviewed: 02/14/2009 [facility] Patient Information 2013 [place].

## 2012-10-07 ENCOUNTER — Encounter: Payer: Self-pay | Admitting: Internal Medicine

## 2012-10-07 ENCOUNTER — Encounter: Payer: BC Managed Care – PPO | Admitting: Internal Medicine

## 2012-10-07 NOTE — Progress Notes (Signed)
Opened for visit that was canacelled same day

## 2013-03-04 ENCOUNTER — Encounter: Payer: Self-pay | Admitting: Internal Medicine

## 2013-03-04 ENCOUNTER — Ambulatory Visit (INDEPENDENT_AMBULATORY_CARE_PROVIDER_SITE_OTHER): Payer: BC Managed Care – PPO | Admitting: Internal Medicine

## 2013-03-04 VITALS — BP 112/80 | HR 96 | Temp 98.2°F | Wt 168.0 lb

## 2013-03-04 DIAGNOSIS — M79609 Pain in unspecified limb: Secondary | ICD-10-CM

## 2013-03-04 DIAGNOSIS — M546 Pain in thoracic spine: Secondary | ICD-10-CM

## 2013-03-04 DIAGNOSIS — M79671 Pain in right foot: Secondary | ICD-10-CM

## 2013-03-04 DIAGNOSIS — Z23 Encounter for immunization: Secondary | ICD-10-CM

## 2013-03-04 DIAGNOSIS — M549 Dorsalgia, unspecified: Secondary | ICD-10-CM

## 2013-03-04 NOTE — Patient Instructions (Signed)
Pay attention to back hygeine     exercises for posture    Will do a referral   About the back. Feet pain.  Etc.   Can take     advil  2-3 x per day     Or 2 aleve 2 x per day .

## 2013-03-04 NOTE — Progress Notes (Signed)
Chief Complaint  Patient presents with  . Back Pain    Beside her right shoulder and into the center of her back.    HPI: Patient comes in today for SDA for  new problem evaluation. See above  Onset since Friday 5 days   Worse when bent over to pick up her almost 24 YO small child and turnign left and righ hurt and then left.  Yesterday.  Took advil today.  Some improved  Works  Pharmacologist school [employer]     No heavy lifting. Except own child. Having off and on lbp at work stands a lot .Ever since her c section  Also feet hurt in am  Wears good shoes  No radation  ROS: See pertinent positives and negatives per HPI. No cp sob GI GU sx with t his no trauma at present.   Past Medical History  Diagnosis Date  . Headache(784.0)   . Pilonidal cyst   . Acne   . Irregular periods     Family History  Problem Relation Age of Onset  . Other Mother     irreg periods  . Other Sister     irreg periods  . Cancer Maternal Aunt     breast  . Cancer Paternal Aunt     brain    History   Social History  . Marital Status: Single    Spouse Name: N/A    Number of Children: N/A  . Years of Education: N/A   Social History Main Topics  . Smoking status: Never Smoker   . Smokeless tobacco: Never Used  . Alcohol Use: Yes  . Drug Use: No  . Sexual Activity: None   Other Topics Concern  . 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 Etonogestrel (IMPLANON) 68 MG IMPL Inject 1 each into the skin once. Placed in summer of 2012       No facility-administered encounter medications on file as of 03/04/2013.    EXAM:  BP 112/80  Pulse 96  Temp(Src) 98.2 F (36.8  C) (Oral)  Wt 168 lb (76.204 kg)  BMI 34.51 kg/m2  SpO2 98%  Body mass index is 34.51 kg/(m^2).  GENERAL: vitals reviewed and listed above, alert, oriented, appears well hydrated and in no acute distress  HEENT: atraumatic, conjunctiva  clear, no obvious abnormalities on inspection of external nose and earstms clear  OP : no lesion edema or exudate   NECK: no obvious masses on inspection palpation  Some paine with rotation to left and extension down mid spine  No acute distress no point bony tenderness  LUNGS: clear to auscultation bilaterally, no wheezes, rales or rhonchi, good air movement CV: HRRR, no clubbing cyanosis or  peripheral edema nl cap refill  MS: moves all extremities without noticeable focal  Abnormality afraid to lift shoulder but able to do this ( no sig pain today) Skin no cute rashes  PSYCH: pleasant and cooperative, no obvious depression or anxiety  ASSESSMENT AND PLAN:  Discussed the following assessment and plan:  Back pain - Plan: Ambulatory referral to Sports Medicine  Acute upper back pain - Plan: Ambulatory referral to Sports Medicine  Pain in both feet - Plan: Ambulatory referral to Sports Medicine  Need for prophylactic vaccination and inoculation against influenza - Plan: Flu Vaccine QUAD 36+ mos PF IM (Fluarix) Feel this is MS mechanical or neuritis in nature  Many? And postural issues  No obv cv  Systemic cause at this time .  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 Exam is reassuring .  options discussed . dont t hink back brace ? Would be that helpful. Perhaps and reg exercise regimen  Refer to [facility]  To advise  And plan.  -Patient advised to return or notify health care team  if symptoms worsen or persist or new concerns arise.  Patient Instructions  Pay attention to back hygeine     exercises for posture    Will do a referral   About the back. Feet pain.  Etc.   Can take     advil  2-3 x per day      Or 2 aleve 2 x per day .    Standley Brooking. Bluma Buresh M.D.  Total visit 74mns > 50% spent counseling and coordinating care

## 2013-03-18 ENCOUNTER — Encounter: Payer: Self-pay | Admitting: Emergency Medicine

## 2013-03-18 ENCOUNTER — Ambulatory Visit (INDEPENDENT_AMBULATORY_CARE_PROVIDER_SITE_OTHER): Payer: BC Managed Care – PPO | Admitting: Emergency Medicine

## 2013-03-18 VITALS — BP 117/80 | HR 73 | Ht 59.0 in | Wt 163.0 lb

## 2013-03-18 DIAGNOSIS — M62830 Muscle spasm of back: Secondary | ICD-10-CM

## 2013-03-18 DIAGNOSIS — M5380 Other specified dorsopathies, site unspecified: Secondary | ICD-10-CM

## 2013-03-18 MED ORDER — NAPROXEN 500 MG PO TABS: 500.0000 mg | ORAL_TABLET | Freq: Two times a day (BID) | ORAL | Status: DC | PRN

## 2013-03-18 NOTE — Assessment & Plan Note (Signed)
Patient with symptomatic right rhomboid spasm. Recommended Naprosyn twice a day for one week. In addition recommended heat. Advised lifting precautions and good posture. Given home exercise program for back strengthening and flexibility. Followup as needed

## 2013-03-18 NOTE — Progress Notes (Signed)
Patient ID: Cheryl Walters, female   DOB: 07/14/1990, 22 y.o.   MRN: [id number] 22 yo female complaint of upper back pain onset approximately 3-4 weeks ago.  Pain right paraspinous between shoulder blades. Was mild and achy until lifting her young child at which time it got suddenly worse. Has continued to be painful since then. Mild relief with Naprosyn however only sporadically taking it.  Worse with heavy lifting. No radiation of pain. No weakness or numbness.  Past Medical History  Diagnosis Date  . Headache(784.0)   . Pilonidal cyst   . Acne   . Irregular periods    Past Surgical History  Procedure Laterality Date  . Cesarean section      Review of systems as per history of present illness otherwise negative.  Examination: BP 117/80  Pulse 73  Ht 4' 11"  (1.499 m)  Wt 163 lb (73.936 kg)  BMI 32.90 kg/m2 Well-developed well-nourished 22 year old female awake alert and oriented in no acute distress Back: Tender to palpation right superior rhomboid region Normal scapular glide No vertebral point tenderness No palpable muscle spasm

## 2013-10-07 ENCOUNTER — Encounter: Payer: Self-pay | Admitting: Internal Medicine

## 2013-10-07 ENCOUNTER — Ambulatory Visit (INDEPENDENT_AMBULATORY_CARE_PROVIDER_SITE_OTHER): Payer: BC Managed Care – PPO | Admitting: Internal Medicine

## 2013-10-07 VITALS — BP 116/80 | Temp 98.6°F | Ht 58.5 in | Wt 169.0 lb

## 2013-10-07 DIAGNOSIS — L299 Pruritus, unspecified: Secondary | ICD-10-CM

## 2013-10-07 MED ORDER — HYDROXYZINE HCL 25 MG PO TABS: 25.0000 mg | ORAL_TABLET | Freq: Three times a day (TID) | ORAL | Status: DC | PRN

## 2013-10-07 NOTE — Progress Notes (Signed)
Chief Complaint  Patient presents with  . Rash    "Bumps" on her arms, chest and legs    HPI: Patient comes in today for SDA for  new problem evaluation. Rash for almost 2 weeks. No one else kevin has some not ac.  Began on forearms and then lower legs and upper chest  Not on back face or head  unceratin cause no one else is itching much although kevin may have mild  Itching  No exposures  No new meds  Dial soap no FS  ROS: See pertinent positives and negatives per HPI. No fevers strep exposure acute illness.  Past Medical History  Diagnosis Date  . Headache(784.0)   . Pilonidal cyst   . Acne   . Irregular periods     Family History  Problem Relation Age of Onset  . Other Mother     irreg periods  . Other Sister     irreg periods  . Cancer Maternal Aunt     breast  . Cancer Paternal Aunt     brain    History   Social History  . Marital Status: Single    Spouse Name: N/A    Number of Children: N/A  . Years of Education: N/A   Social History Main Topics  . Smoking status: Never Smoker   . Smokeless tobacco: Never Used  . Alcohol Use: Yes  . Drug Use: No  . Sexual Activity: None   Other Topics Concern  . 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 Etonogestrel (IMPLANON) 68 MG IMPL Inject 1 each into the skin once. Placed in summer of 2012  . hydrOXYzine (ATARAX/VISTARIL) 25 MG tablet Take 1 tablet (25 mg total) by mouth 3 (three) times daily as needed for itching (not before driving).  . [DISCONTINUED] naproxen (NAPROSYN) 500 MG tablet Take 1 tablet (500 mg total) by mouth 2 (two) times daily as needed.    EXAM:  BP 116/80  Temp(Src) 98.6 F  (37 C) (Oral)  Ht 4' 10.5" (1.486 m)  Wt 169 lb (76.658 kg)  BMI 34.72 kg/m2  Body mass index is 34.72 kg/(m^2).  GENERAL: vitals reviewed and listed above, alert, oriented, appears well hydrated and in no acute distress HEENT: atraumatic, conjunctiva  clear, no obvious abnormalities on inspection of external nose and ears OP : no lesion edema or exudate  NECK: no obvious masses on inspection palpation  AMS: moves all extremities without noticeable focal  Abnormality Skin: normal capillary refill ,turgor , color: No  ,petechiae or bruising No burrows  Points to areas of small patch forearms no interdigital papules vesicles   Some  mild acne  No  Hives PSYCH: pleasant and cooperative, no obvious depression or anxiety scratching at times.  ASSESSMENT AND PLAN:  Discussed the following assessment and plan:  Itching - ? cause seems al;legic or antihistamine  head trigger no burrows minimal rash sx rx for now and fu if not better   -Patient advised to return or  notify health care team  if symptoms worsen ,persist or new concerns arise.  Patient Instructions  Uncertain which cause of itching but   Would treat with antihistamines at this time.  Use aveeno  And moisturizers Cool showers .  Antihistamine  Day zyrtec generic ok  hydroxizine at night or when not driving  Expect  To improve in the next 5 days or so . i fnot then recheck .    Pruritus  Pruritis is an itch. There are many different problems that can cause an itch. Dry skin is one of the most common causes of itching. Most cases of itching do not require medical attention.  HOME CARE INSTRUCTIONS  Make sure your skin is moistened on a regular basis. A moisturizer that contains petroleum jelly is best for keeping moisture in your skin. If you develop a rash, you may try the following for relief:   Use corticosteroid cream.  Apply cool compresses to the affected areas.  Bathe with Epsom salts or baking soda in the  bathwater.  Soak in colloidal oatmeal baths. These are available at your pharmacy.  Apply baking soda paste to the rash. Stir water into baking soda until it reaches a paste-like consistency.  Use an anti-itch lotion.  Take over-the-counter diphenhydramine medicine by mouth as the instructions direct.  Avoid scratching. Scratching may cause the rash to become infected. If itching is very bad, your caregiver may suggest prescription lotions or creams to lessen your symptoms.  Avoid hot showers, which can make itching worse. A cold shower may help with itching as long as you use a moisturizer after the shower. SEEK MEDICAL CARE IF: The itching does not go away after several days. Document Released: 01/10/2011 Document Revised: 07/23/2011 Document Reviewed: 01/10/2011 [facility] Patient Information 2014 [place], [place].      Standley Brooking. Jodi Kappes M.D.  Pre visit review using our clinic review tool, if applicable. No additional management support is needed unless otherwise documented below in the visit note.

## 2013-10-07 NOTE — Patient Instructions (Signed)
Uncertain which cause of itching but   Would treat with antihistamines at this time.  Use aveeno  And moisturizers Cool showers .  Antihistamine  Day zyrtec generic ok  hydroxizine at night or when not driving  Expect  To improve in the next 5 days or so . i fnot then recheck .    Pruritus  Pruritis is an itch. There are many different problems that can cause an itch. Dry skin is one of the most common causes of itching. Most cases of itching do not require medical attention.  HOME CARE INSTRUCTIONS  Make sure your skin is moistened on a regular basis. A moisturizer that contains petroleum jelly is best for keeping moisture in your skin. If you develop a rash, you may try the following for relief:   Use corticosteroid cream.  Apply cool compresses to the affected areas.  Bathe with Epsom salts or baking soda in the bathwater.  Soak in colloidal oatmeal baths. These are available at your pharmacy.  Apply baking soda paste to the rash. Stir water into baking soda until it reaches a paste-like consistency.  Use an anti-itch lotion.  Take over-the-counter diphenhydramine medicine by mouth as the instructions direct.  Avoid scratching. Scratching may cause the rash to become infected. If itching is very bad, your caregiver may suggest prescription lotions or creams to lessen your symptoms.  Avoid hot showers, which can make itching worse. A cold shower may help with itching as long as you use a moisturizer after the shower. SEEK MEDICAL CARE IF: The itching does not go away after several days. Document Released: 01/10/2011 Document Revised: 07/23/2011 Document Reviewed: 01/10/2011 [facility] Patient Information 2014 [place], [place].

## 2013-10-09 ENCOUNTER — Telehealth: Payer: Self-pay | Admitting: Internal Medicine

## 2013-10-09 NOTE — Telephone Encounter (Signed)
Left a message at the below listed number.  Home phone on file is not in service.

## 2013-10-09 NOTE — Telephone Encounter (Signed)
Patient Information:  Caller Name: Athina  Phone: [phone]  Patient: Cheryl Walters  Gender: Female  DOB: 1990/09/02  Age: 23 Years  PCP: [facility])  Pregnant: No  Office Follow Up:  Does the office need to follow up with this patient?: Yes  Instructions For The Office: Please call patient if MD would like to have her rechecked in the office.  RN Note:  Refused to come back into office to be rechecked. Advised to call back if rash persists > 48 hours while taking antihistamines or with any new sx such as fever or increased throat pain.  Symptoms  Reason For Call & Symptoms: Seen in the office on 10/07/13  for itching skin onset 2 weeks ago-  and pimple like rash on arms since 10/06/13. Given Hyroxizine Rx to take at night and  taking Zyrtec during the day and it is helping with itching but still has rash and it has spread to the tops of both hands. Last night throat started  hurting > R side. Afebrile.  Reviewed Health History In EMR: Yes  Reviewed Medications In EMR: Yes  Reviewed Allergies In EMR: Yes  Reviewed Surgeries / Procedures: Yes  Date of Onset of Symptoms: 09/26/2013 OB / GYN:  LMP: Unknown  Guideline(s) Used:  Rash or Redness - Widespread  Disposition Per Guideline:   See Today in Office  Reason For Disposition Reached:   Sore throat  Advice Given:  Reassurance:  There are many causes of widespread rashes and most of the time they are not serious. Common causes include viral illness (e.g., cold viruses) and allergic reactions (to a food, medicine, or environmental exposure).  For Itchy Rashes:  Wash the skin once with gentle non-perfumed soap to remove any irritants. Rinse the soap off thoroughly.  You may also take an oatmeal (Aveeno) bath or take an antihistamine medication by mouth to help reduce the itching.  Oatmeal Aveeno Bath for Itching:  Sprinkle contents of one Aveeno packet under running faucet with comfortably warm  water. Bathe for 15 - 20 minutes, 1-2 times daily.  Oral Antihistamine Medication for Itching:  Take an antihistamine like diphenhydramine (Benadryl) for widespread rashes that itch. The adult dosage of Benadryl is 25-50 mg by mouth 4 times daily.  Contagiousness:  Avoid contact with pregnant women until a diagnosis is made. Most viral rashes are contagious (especially if a fever is present). You can return to work or school after the rash is gone or when your doctor says it is safe to return with the rash.  Expected Course:  Most viral rashes disappear within 48 hours.  Call Back If:   You become worse  Patient Refused Recommendation:  Patient Will Follow Up With Office Later  She was calling to let MD know that rash has spread to the backs of both hands. Throat is now sore- refusing to come back in at this time to be rechecked.

## 2013-10-12 NOTE — Telephone Encounter (Signed)
Spoke to the pt.  It was after 5 on Friday when she received the messages I left her.  She stated she is doing much better.  Rash is almost completely gone and is no longer itching.  Advised that she keep any eye on it and call to be seen if necessary.

## 2014-01-19 ENCOUNTER — Ambulatory Visit (INDEPENDENT_AMBULATORY_CARE_PROVIDER_SITE_OTHER): Payer: BC Managed Care – PPO | Admitting: Internal Medicine

## 2014-01-19 ENCOUNTER — Encounter: Payer: Self-pay | Admitting: Internal Medicine

## 2014-01-19 VITALS — BP 110/80 | HR 90 | Temp 97.9°F | Ht 58.5 in | Wt 168.8 lb

## 2014-01-19 DIAGNOSIS — B9789 Other viral agents as the cause of diseases classified elsewhere: Principal | ICD-10-CM

## 2014-01-19 DIAGNOSIS — J069 Acute upper respiratory infection, unspecified: Secondary | ICD-10-CM

## 2014-01-19 NOTE — Patient Instructions (Signed)
Chest is normal . Fu if fever  Deterioration or concerns . Comfort care otherwise   Seems like viral chest and head cold .

## 2014-01-19 NOTE — Progress Notes (Signed)
Pre visit review using our clinic review tool, if applicable. No additional management support is needed unless otherwise documented below in the visit note.  Chief Complaint  Patient presents with  . Cough    Ongoing for 1 week.  . Nasal Congestion    HPI: Patient Cheryl Walters  comes in today for SDA for  new problem evaluation. Here with 23 yo some similar sx .  Onset over a week ago  Cough and now runny nose noted  No fever now sob Others in family had been sick previously .  No wheezing sob . ROS: See pertinent positives and negatives per HPI.  Past Medical History  Diagnosis Date  . Headache(784.0)   . Pilonidal cyst   . Acne   . Irregular periods     Family History  Problem Relation Age of Onset  . Other Mother     irreg periods  . Other Sister     irreg periods  . Cancer Maternal Aunt     breast  . Cancer Paternal Aunt     brain    History   Social History  . Marital Status: Single    Spouse Name: N/A    Number of Children: N/A  . Years of Education: N/A   Social History Main Topics  . Smoking status: Never Smoker   . Smokeless tobacco: Never Used  . Alcohol Use: Yes  . Drug Use: No  . Sexual Activity: None   Other Topics Concern  . 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 Etonogestrel (IMPLANON) 68 MG IMPL Inject 1 each into the skin once. Placed in summer of 2012  . [DISCONTINUED] hydrOXYzine (ATARAX/VISTARIL) 25 MG tablet Take 1 tablet (25 mg total) by mouth 3 (three) times daily as needed for itching (not before driving).    EXAM:  BP 110/80  Pulse 90  Temp(Src) 97.9 F (36.6  C) (Oral)  Ht 4' 10.5" (1.486 m)  Wt 168 lb 12.8 oz (76.567 kg)  BMI 34.67 kg/m2  SpO2 98%  Body mass index is 34.67 kg/(m^2).  GENERAL: vitals reviewed and listed above, alert, oriented, appears well hydrated and in no acute distress mild congestion cough non toxic unlabored resp  HEENT: atraumatic, conjunctiva  clear, no obvious abnormalities on inspection of external nose and ears tms clear  OP : no lesion edema or exudate nares slight congestion NECK: no obvious masses on inspection palpation  LUNGS: clear to auscultation bilaterally, no wheezes, rales or rhonchi, good air movement CV: HRRR, no clubbing cyanosis  nl cap refill   ASSESSMENT AND PLAN:  Discussed the following assessment and plan:  Viral URI with cough - context and exam follow for complications and recheck as needed .   -Patient advised to return or notify health care team  if symptoms worsen ,persist or new concerns arise.  Patient Instructions  Chest is normal . Fu if fever  Deterioration  or concerns . Comfort care otherwise   Seems like viral chest and head cold .     Standley Brooking. Samantha Olivera M.D.

## 2014-02-26 ENCOUNTER — Encounter: Payer: Self-pay | Admitting: Internal Medicine

## 2014-02-26 ENCOUNTER — Encounter: Payer: BC Managed Care – PPO | Admitting: Internal Medicine

## 2014-02-26 NOTE — Progress Notes (Signed)
Document opened and reviewed for OV but appt  canceled same day .  

## 2014-06-11 ENCOUNTER — Ambulatory Visit (INDEPENDENT_AMBULATORY_CARE_PROVIDER_SITE_OTHER): Payer: BLUE CROSS/BLUE SHIELD | Admitting: Internal Medicine

## 2014-06-11 ENCOUNTER — Encounter: Payer: Self-pay | Admitting: Internal Medicine

## 2014-06-11 VITALS — BP 104/80 | Temp 98.0°F | Ht 59.0 in | Wt 167.0 lb

## 2014-06-11 DIAGNOSIS — R110 Nausea: Secondary | ICD-10-CM

## 2014-06-11 DIAGNOSIS — G4720 Circadian rhythm sleep disorder, unspecified type: Secondary | ICD-10-CM | POA: Insufficient documentation

## 2014-06-11 DIAGNOSIS — R109 Unspecified abdominal pain: Secondary | ICD-10-CM

## 2014-06-11 DIAGNOSIS — N390 Urinary tract infection, site not specified: Secondary | ICD-10-CM

## 2014-06-11 DIAGNOSIS — R8281 Pyuria: Secondary | ICD-10-CM

## 2014-06-11 DIAGNOSIS — G4700 Insomnia, unspecified: Secondary | ICD-10-CM

## 2014-06-11 DIAGNOSIS — G479 Sleep disorder, unspecified: Secondary | ICD-10-CM

## 2014-06-11 LAB — HEPATIC FUNCTION PANEL
ALT: 12 U/L (ref 0–35)
AST: 15 U/L (ref 0–37)
Albumin: 3.1 g/dL — ABNORMAL LOW (ref 3.5–5.2)
Alkaline Phosphatase: 49 U/L (ref 39–117)
BILIRUBIN INDIRECT: 0.3 mg/dL (ref 0.2–1.2)
Bilirubin, Direct: 0.1 mg/dL (ref 0.0–0.3)
Total Bilirubin: 0.4 mg/dL (ref 0.2–1.2)
Total Protein: 5.3 g/dL — ABNORMAL LOW (ref 6.0–8.3)

## 2014-06-11 LAB — POCT URINALYSIS DIPSTICK
BILIRUBIN UA: NEGATIVE
Glucose, UA: NEGATIVE
KETONES UA: NEGATIVE
NITRITE UA: NEGATIVE
PH UA: 6
Protein, UA: NEGATIVE
RBC UA: NEGATIVE
Spec Grav, UA: 1.025
Urobilinogen, UA: 0.2

## 2014-06-11 LAB — CBC WITH DIFFERENTIAL/PLATELET
BASOS ABS: 0 10*3/uL (ref 0.0–0.1)
BASOS PCT: 0 % (ref 0–1)
EOS PCT: 1 % (ref 0–5)
Eosinophils Absolute: 0.1 10*3/uL (ref 0.0–0.7)
HEMATOCRIT: 46 % (ref 36.0–46.0)
HEMOGLOBIN: 15.4 g/dL — AB (ref 12.0–15.0)
Lymphocytes Relative: 34 % (ref 12–46)
Lymphs Abs: 4.9 10*3/uL — ABNORMAL HIGH (ref 0.7–4.0)
MCH: 27.6 pg (ref 26.0–34.0)
MCHC: 33.5 g/dL (ref 30.0–36.0)
MCV: 82.4 fL (ref 78.0–100.0)
MONOS PCT: 7 % (ref 3–12)
MPV: 9.1 fL (ref 8.6–12.4)
Monocytes Absolute: 1 10*3/uL (ref 0.1–1.0)
NEUTROS ABS: 8.4 10*3/uL — AB (ref 1.7–7.7)
Neutrophils Relative %: 58 % (ref 43–77)
Platelets: 358 10*3/uL (ref 150–400)
RBC: 5.58 MIL/uL — AB (ref 3.87–5.11)
RDW: 13.2 % (ref 11.5–15.5)
WBC: 14.4 10*3/uL — ABNORMAL HIGH (ref 4.0–10.5)

## 2014-06-11 LAB — BASIC METABOLIC PANEL
BUN: 8 mg/dL (ref 6–23)
CALCIUM: 8.4 mg/dL (ref 8.4–10.5)
CO2: 26 meq/L (ref 19–32)
CREATININE: 0.82 mg/dL (ref 0.50–1.10)
Chloride: 106 mEq/L (ref 96–112)
Glucose, Bld: 100 mg/dL — ABNORMAL HIGH (ref 70–99)
Potassium: 4.5 mEq/L (ref 3.5–5.3)
SODIUM: 140 meq/L (ref 135–145)

## 2014-06-11 LAB — TSH: TSH: 2.502 u[IU]/mL (ref 0.350–4.500)

## 2014-06-11 LAB — T4, FREE: Free T4: 1.2 ng/dL (ref 0.80–1.80)

## 2014-06-11 LAB — POCT URINE PREGNANCY: PREG TEST UR: NEGATIVE

## 2014-06-11 MED ORDER — SULFAMETHOXAZOLE-TRIMETHOPRIM 800-160 MG PO TABS: 1.0000 | ORAL_TABLET | Freq: Two times a day (BID) | ORAL | Status: DC

## 2014-06-11 NOTE — Patient Instructions (Addendum)
You may have a uti but this doesn't explain all of the symptoms . Will notify you  of labs when available. To day to check for  Blood count sugar and thyroid etc . Sleep   Hygiene   To get used to   Good sleep habits.   Insomnia Insomnia is frequent trouble falling and/or staying asleep. Insomnia can be a long term problem or a short term problem. Both are common. Insomnia can be a short term problem when the wakefulness is related to a certain stress or worry. Long term insomnia is often related to ongoing stress during waking hours and/or poor sleeping habits. Overtime, sleep deprivation itself can make the problem worse. Every little thing feels more severe because you are overtired and your ability to cope is decreased. CAUSES   Stress, anxiety, and depression.  Poor sleeping habits.  Distractions such as TV in the bedroom.  Naps close to bedtime.  Engaging in emotionally charged conversations before bed.  Technical reading before sleep.  Alcohol and other sedatives. They may make the problem worse. They can hurt normal sleep patterns and normal dream activity.  Stimulants such as caffeine for several hours prior to bedtime.  Pain syndromes and shortness of breath can cause insomnia.  Exercise late at night.  Changing time zones may cause sleeping problems (jet lag). It is sometimes helpful to have someone observe your sleeping patterns. They should look for periods of not breathing during the night (sleep apnea). They should also look to see how long those periods last. If you live alone or observers are uncertain, you can also be observed at a sleep clinic where your sleep patterns will be professionally monitored. Sleep apnea requires a checkup and treatment. Give your caregivers your medical history. Give your caregivers observations your family has made about your sleep.  SYMPTOMS   Not feeling rested in the morning.  Anxiety and restlessness at bedtime.  Difficulty  falling and staying asleep. TREATMENT   Your caregiver may prescribe treatment for an underlying medical disorders. Your caregiver can give advice or help if you are using alcohol or other drugs for self-medication. Treatment of underlying problems will usually eliminate insomnia problems.  Medications can be prescribed for short time use. They are generally not recommended for lengthy use.  Over-the-counter sleep medicines are not recommended for lengthy use. They can be habit forming.  You can promote easier sleeping by making lifestyle changes such as:  Using relaxation techniques that help with breathing and reduce muscle tension.  Exercising earlier in the day.  Changing your diet and the time of your last meal. No night time snacks.  Establish a regular time to go to bed.  Counseling can help with stressful problems and worry.  Soothing music and white noise may be helpful if there are background noises you cannot remove.  Stop tedious detailed work at least one hour before bedtime. HOME CARE INSTRUCTIONS   Keep a diary. Inform your caregiver about your progress. This includes any medication side effects. See your caregiver regularly. Take note of:  Times when you are asleep.  Times when you are awake during the night.  The quality of your sleep.  How you feel the next day. This information will help your caregiver care for you.  Get out of bed if you are still awake after 15 minutes. Read or do some quiet activity. Keep the lights down. Wait until you feel sleepy and go back to bed.  Keep regular sleeping  and waking hours. Avoid naps.  Exercise regularly.  Avoid distractions at bedtime. Distractions include watching television or engaging in any intense or detailed activity like attempting to balance the household checkbook.  Develop a bedtime ritual. Keep a familiar routine of bathing, brushing your teeth, climbing into bed at the same time each night, listening  to soothing music. Routines increase the success of falling to sleep faster.  Use relaxation techniques. This can be using breathing and muscle tension release routines. It can also include visualizing peaceful scenes. You can also help control troubling or intruding thoughts by keeping your mind occupied with boring or repetitive thoughts like the old concept of counting sheep. You can make it more creative like imagining planting one beautiful flower after another in your backyard garden.  During your day, work to eliminate stress. When this is not possible use some of the previous suggestions to help reduce the anxiety that accompanies stressful situations. MAKE SURE YOU:   Understand these instructions.  Will watch your condition.  Will get help right away if you are not doing well or get worse. Document Released: 04/27/2000 Document Revised: 07/23/2011 Document Reviewed: 05/28/2007 [facility] Patient Information 2015 [place], [place]. This information is not intended to replace advice given to you by your health care provider. Make sure you discuss any questions you have with your health care provider.  Track your sleep  For now    Have jace sleep in his own space. Sleep aids have some risk and  Are only short term help .   Sometimes sleep therapy  counseling is helpful.   And considier  Seeing  Sleep specialist.

## 2014-06-11 NOTE — Progress Notes (Signed)
Pre visit review using our clinic review tool, if applicable. No additional management support is needed unless otherwise documented below in the visit note.  Chief Complaint  Patient presents with  . Insomnia    Ongoing for 6 months  . Nausea  . Abdominal Cramping    HPI: Patient Cheryl Walters  comes in today  For multiple  problem evaluation.  Here with 24 yo   Here for wcc.  Not eating and sleeping for a while   5-6 months  Eat small portions and gets full.  No weight loss. No diarrhea .  nause and lower abd crmping more recently ? when "Hemorrhoids cause bleeding."    No diarrhea no weight loss  Last  Lab a while back.  1-2 year.  Period last week. And eating more than  Usually.now Implant  Ob advised mirena. Cheryl Walters ocass   Soda once a   md pepsi .  Trying to decrease.  ROS: See pertinent positives and negatives per HPI.no cp sob no depression . Anxiety  Sleep living   w inlaws and husband to move out end of year   Sleep in one room   24 yo sleep s with her cause" he wont sleep otherwise "   No rls sx no choking    Past Medical History  Diagnosis Date  . Headache(784.0)   . Pilonidal cyst   . Acne   . Irregular periods     Family History  Problem Relation Age of Onset  . Other Mother     irreg periods  . Other Sister     irreg periods  . Cancer Maternal Aunt     breast  . Cancer Paternal Aunt     brain  . Sleep apnea Father     on machine    History   Social History  . Marital Status: Single    Spouse Name: N/A    Number of Children: N/A  . Years of Education: N/A   Social History Main Topics  . Smoking status: Never Smoker   . Smokeless tobacco: Never Used  . Alcohol Use: Yes  . Drug Use: No  . Sexual Activity: None   Other Topics Concern  . 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 sulfamethoxazole-trimethoprim (SEPTRA DS) 800-160 MG per tablet Take 1 tablet by mouth 2 (two) times daily.  . [DISCONTINUED] Etonogestrel (IMPLANON) 68 MG IMPL Inject 1 each into the skin once. Placed in summer of 2012    EXAM:  BP 104/80 mmHg  Temp(Src) 98 F (36.7 C) (Oral)  Ht 4' 11"  (1.499 m)  Wt 167 lb (75.751 kg)  BMI 33.71 kg/m2  Body mass index is 33.71 kg/(m^2).  GENERAL: vitals reviewed and listed above, alert, oriented, appears well hydrated and in no acute distress looks sleepy here with  24 yo  Nl interaction HEENT: atraumatic, conjunctiva  clear, no obvious abnormalities on inspection of external nose and ears OP : no lesion edema or exudate  NECK: no obvious masses on inspection palpation  LUNGS: clear to auscultation bilaterally, no wheezes, rales or rhonchi, good air movement CV: HRRR, no clubbing cyanosis or  peripheral edema nl cap refill  Abdomen:  Sof,t normal bowel sounds without hepatosplenomegaly, no guarding rebound or masses no CVA tenderness  Tender supra and llq area no rebound or masses  MS: moves all extremities without noticeable focal  abnormality PSYCH: pleasant and cooperative, no obvious depression or anxiety Skin mild acne face  No excess body hair   ASSESSMENT AND PLAN:  Discussed the following assessment and plan:  Nausea without vomiting - neg ucg off the implanon for over a month.  jsut had menses - Plan: POCT urine pregnancy, POC Urinalysis Dipstick, Culture, Urine, CANCELED: Basic metabolic panel, CANCELED: CBC with Differential/Platelet, CANCELED: Hemoglobin A1c, CANCELED: Hepatic function panel,  CANCELED: TSH, CANCELED: T4, free, CANCELED: Sedimentation rate  Abdominal cramping - sore this month ? off implanon ? uti freuqncy neg ucg oantibiotcn an UCx to be done - Plan: POCT urine pregnancy, POC Urinalysis Dipstick, Culture, Urine, Basic Metabolic Panel, CBC with Differential/Platelet, Hemoglobin A1c, Hepatic Function Panel, T4, Free, TSH, Sedimentation Rate, CANCELED: Basic metabolic panel, CANCELED: CBC with Differential/Platelet, CANCELED: Hemoglobin A1c, CANCELED: Hepatic function panel, CANCELED: TSH, CANCELED: T4, free, CANCELED: Sedimentation rate  Insomnia - t this time not advise medication until futher delineation of what is goin on.,  - Plan: CANCELED: Basic metabolic panel, CANCELED: CBC with Differential/Platelet, CANCELED: Hemoglobin A1c, CANCELED: Hepatic function panel, CANCELED: TSH, CANCELED: T4, free, CANCELED: Sedimentation rate  Sleep pattern disturbance - poss multifactoria father has sleep apnea. child sleeps with her because wont sleep otherwise   Pyuria - poss uti rx empirically pending dx May need to see dr Cheryl Walters.   Had antibiotic when taken out.  ? About acne  Need to address later . Off an on problem  Multiple   Issues asks about sleeping pill not enouth information about what path to take .  ROV  after 3-4 weeks   May need to see gi or gyne also -Patient advised to return or notify health care team  if symptoms worsen ,persist or new concerns arise.  Patient Instructions  You may have a uti but this doesn't explain all of the symptoms . Will notify you  of labs when available. To day to check for  Blood count sugar and thyroid etc . Sleep   Hygiene   To get used to   Good sleep habits.   Insomnia Insomnia is frequent trouble falling and/or staying asleep. Insomnia can be a long term problem or a short term problem. Both are common. Insomnia can be a short term problem when the wakefulness is related to a certain stress or worry. Long term insomnia is often  related to ongoing stress during waking hours and/or poor sleeping habits. Overtime, sleep deprivation itself can make the problem worse. Every little thing feels more severe because you are overtired and your ability to cope is decreased. CAUSES   Stress, anxiety, and depression.  Poor sleeping habits.  Distractions such as TV in the bedroom.  Naps close to bedtime.  Engaging in emotionally charged conversations  before bed.  Technical reading before sleep.  Alcohol and other sedatives. They may make the problem worse. They can hurt normal sleep patterns and normal dream activity.  Stimulants such as caffeine for several hours prior to bedtime.  Pain syndromes and shortness of breath can cause insomnia.  Exercise late at night.  Changing time zones may cause sleeping problems (jet lag). It is sometimes helpful to have someone observe your sleeping patterns. They should look for periods of not breathing during the night (sleep apnea). They should also look to see how long those periods last. If you live alone or observers are uncertain, you can also be observed at a sleep clinic where your sleep patterns will be professionally monitored. Sleep apnea requires a checkup and treatment. Give your caregivers your medical history. Give your caregivers observations your family has made about your sleep.  SYMPTOMS   Not feeling rested in the morning.  Anxiety and restlessness at bedtime.  Difficulty falling and staying asleep. TREATMENT   Your caregiver may prescribe treatment for an underlying medical disorders. Your caregiver can give advice or help if you are using alcohol or other drugs for self-medication. Treatment of underlying problems will usually eliminate insomnia problems.  Medications can be prescribed for short time use. They are generally not recommended for lengthy use.  Over-the-counter sleep medicines are not recommended for lengthy use. They can be habit  forming.  You can promote easier sleeping by making lifestyle changes such as:  Using relaxation techniques that help with breathing and reduce muscle tension.  Exercising earlier in the day.  Changing your diet and the time of your last meal. No night time snacks.  Establish a regular time to go to bed.  Counseling can help with stressful problems and worry.  Soothing music and white noise may be helpful if there are background noises you cannot remove.  Stop tedious detailed work at least one hour before bedtime. HOME CARE INSTRUCTIONS   Keep a diary. Inform your caregiver about your progress. This includes any medication side effects. See your caregiver regularly. Take note of:  Times when you are asleep.  Times when you are awake during the night.  The quality of your sleep.  How you feel the next day. This information will help your caregiver care for you.  Get out of bed if you are still awake after 15 minutes. Read or do some quiet activity. Keep the lights down. Wait until you feel sleepy and go back to bed.  Keep regular sleeping and waking hours. Avoid naps.  Exercise regularly.  Avoid distractions at bedtime. Distractions include watching television or engaging in any intense or detailed activity like attempting to balance the household checkbook.  Develop a bedtime ritual. Keep a familiar routine of bathing, brushing your teeth, climbing into bed at the same time each night, listening to soothing music. Routines increase the success of falling to sleep faster.  Use relaxation techniques. This can be using breathing and muscle tension release routines. It can also include visualizing peaceful scenes. You can also help control troubling or intruding thoughts by keeping your mind occupied with boring or repetitive thoughts like the old concept of counting sheep. You can make it more creative like imagining planting one beautiful flower after another in your backyard  garden.  During your day, work to eliminate stress. When this is not possible use some of the previous suggestions to help reduce the anxiety that accompanies stressful situations. MAKE SURE YOU:  Understand these instructions.  Will watch your condition.  Will get help right away if you are not doing well or get worse. Document Released: 04/27/2000 Document Revised: 07/23/2011 Document Reviewed: 05/28/2007 [facility] Patient Information 2015 [place], [place]. This information is not intended to replace advice given to you by your health care provider. Make sure you discuss any questions you have with your health care provider.  Track your sleep  For now    Have jace sleep in his own space. Sleep aids have some risk and  Are only short term help .   Sometimes sleep therapy  counseling is helpful.   And considier  Seeing  Sleep specialist.      Standley Brooking. Cheryl Walters M.D.

## 2014-06-12 LAB — HEMOGLOBIN A1C
Hgb A1c MFr Bld: 5.3 % (ref <5.7)
Mean Plasma Glucose: 105 mg/dL (ref <117)

## 2014-06-12 LAB — SEDIMENTATION RATE: Sed Rate: 1 mm/hr (ref 0–22)

## 2014-06-13 LAB — URINE CULTURE

## 2014-06-18 ENCOUNTER — Telehealth: Payer: Self-pay | Admitting: Family Medicine

## 2014-06-18 NOTE — Telephone Encounter (Signed)
Pt is requesting a prescription for acne.  She stated it was discussed in the [facility] on 06/11/14.  Please advise. Thanks!

## 2014-06-22 MED ORDER — CLINDAMYCIN PHOS-BENZOYL PEROX 1-5 % EX GEL: CUTANEOUS | Status: DC

## 2014-06-22 MED ORDER — ADAPALENE 0.1 % EX CREA: TOPICAL_CREAM | Freq: Every day | CUTANEOUS | Status: DC

## 2014-06-22 NOTE — Telephone Encounter (Signed)
Uncertain what she has tried before  . Usually use  Topical meds  And sometimes pills.  Differin at night and benzaclin in am  Fu after   6-8 weeks  If not helping  Face can get irritated before it clears up.

## 2014-06-22 NOTE — Telephone Encounter (Signed)
Spoke to the pt.  She continues to have abdominal pain.  No other sx.  Not sure what to do next.  Please advise.  Thanks!

## 2014-06-23 NOTE — Telephone Encounter (Signed)
Pt coming to see [facility] on 06/25/14

## 2014-06-23 NOTE — Telephone Encounter (Signed)
Please schedule another OV to check the abd pain   Make appt with her gyne to give opinion  If  Pain could be gyne related .

## 2014-06-25 ENCOUNTER — Encounter: Payer: Self-pay | Admitting: Internal Medicine

## 2014-06-25 ENCOUNTER — Ambulatory Visit (INDEPENDENT_AMBULATORY_CARE_PROVIDER_SITE_OTHER): Payer: BLUE CROSS/BLUE SHIELD | Admitting: Internal Medicine

## 2014-06-25 VITALS — BP 108/54 | Temp 98.2°F | Ht 59.0 in | Wt 165.8 lb

## 2014-06-25 DIAGNOSIS — R109 Unspecified abdominal pain: Secondary | ICD-10-CM

## 2014-06-25 DIAGNOSIS — K921 Melena: Secondary | ICD-10-CM

## 2014-06-25 DIAGNOSIS — Z87440 Personal history of urinary (tract) infections: Secondary | ICD-10-CM

## 2014-06-25 DIAGNOSIS — L700 Acne vulgaris: Secondary | ICD-10-CM

## 2014-06-25 LAB — POCT URINALYSIS DIPSTICK
BILIRUBIN UA: NEGATIVE
Blood, UA: NEGATIVE
Glucose, UA: NEGATIVE
KETONES UA: NEGATIVE
Nitrite, UA: NEGATIVE
PH UA: 7
PROTEIN UA: NEGATIVE
SPEC GRAV UA: 1.01
Urobilinogen, UA: 0.2

## 2014-06-25 NOTE — Patient Instructions (Signed)
Need to arrange  Seeing your GYNE to have her assess if any of your sx could be from a gyne problem .  Otherwise concern about a colitis or inflammation of the bowel that could be ongoing. And with the hx  Of blood in the stool . Also .  We may need to do  A ct scan of the abdormen pelvis  If getting worse before  You see the specialist . Contact us asap  if you get a fever or  worsening  . In the interim .

## 2014-06-25 NOTE — Progress Notes (Signed)
Pre visit review using our clinic review tool, if applicable. No additional management support is needed unless otherwise documented below in the visit note.  Chief Complaint  Patient presents with  . Abdominal Pain    HPI: Cheryl Walters 24 y.o. comes in    Fu of  Last visit  Multiple issues  Lower abd pain was  About  75 % improved when given bactrim for poss uti for 5 days    and then  acouple days ago  Had bad pain a few days ago in [place].  No fever.  No v or d   Has had blood stool and in stool and around but no diarrhea  Felt to be hemorrhoids and given supp by gyne but couldn't use them  No fever weightl oss  Vagina sx     Low risk sti .  Some decrease appetite  No radiation to back .   reviewing hx : Started after implanon removed about a month ago  And ongoing  Had a painful period   And breast tenderness 2 neg ucg   ( hasnt taken acne med yet)  ROS: See pertinent positives and negatives per HPI. ? No dx of endometriosis   Past Medical History  Diagnosis Date  . Headache(784.0)   . Pilonidal cyst   . Acne   . Irregular periods     Family History  Problem Relation Age of Onset  . Other Mother     irreg periods  . Other Sister     irreg periods  . Cancer Maternal Aunt     breast  . Cancer Paternal Aunt     brain  . Sleep apnea Father     on machine    History   Social History  . Marital Status: Single    Spouse Name: N/A  . Number of Children: N/A  . Years of Education: N/A   Social History Main Topics  . Smoking status: Never Smoker   . Smokeless tobacco: Never Used  . Alcohol Use: Yes  . Drug Use: No  . Sexual Activity: Not on file   Other Topics Concern  . 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 adapalene (DIFFERIN) 0.1 % cream Apply topically at bedtime. Pea sized amount to affected area  As directed (Patient not taking: Reported on 06/25/2014)  . clindamycin-benzoyl peroxide (BENZACLIN) gel Apply topically    1-2 x per day for acne (Patient not taking: Reported on 06/25/2014)  . [DISCONTINUED] sulfamethoxazole-trimethoprim (SEPTRA DS) 800-160 MG per tablet Take 1 tablet by mouth 2 (two) times daily.    EXAM:  BP 108/54 mmHg  Temp(Src) 98.2 F (36.8 C) (Oral)  Ht 4' 11"  (1.499  m)  Wt 165 lb 12.8 oz (75.206 kg)  BMI 33.47 kg/m2  Body mass index is 33.47 kg/(m^2).  GENERAL: vitals reviewed and listed above, alert, oriented, appears well hydrated and in no acute distress HEENT: atraumatic, conjunctiva  clear, no obvious abnormalities on inspection of external nose and ears  NECK: no obvious masses on inspection palpation  LUNGS: clear to auscultation bilaterally, no wheezes, rales or rhonchi, good air movement CV: HRRR, no clubbing cyanosis or  peripheral edema nl cap refill  Abdomen:  Sof,t normal bowel sounds without hepatosplenomegaly, tender llq / if  guarding  No rebound or masses no CVA tenderness MS: moves all extremities without noticeable focal  abnormality PSYCH: pleasant and cooperative, no obvious depression or anxiety Lab Results  Component Value Date   WBC 14.4* 06/11/2014   HGB 15.4* 06/11/2014   HCT 46.0 06/11/2014   PLT 358 06/11/2014   GLUCOSE 100* 06/11/2014   CHOL 192 12/03/2007   TRIG 37 12/03/2007   HDL 83.2 12/03/2007   LDLCALC 101* 12/03/2007   ALT 12 06/11/2014   AST 15 06/11/2014   NA 140 06/11/2014   K 4.5 06/11/2014   CL 106 06/11/2014   CREATININE 0.82 06/11/2014   BUN 8 06/11/2014   CO2 26  06/11/2014   TSH 2.502 06/11/2014   HGBA1C 5.3 06/11/2014   ua clear trc leuk u cx multiple spp last ASSESSMENT AND PLAN:  Discussed the following assessment and plan:  Abdominal cramping - mostly llq pelvic / no mass felt  no fever but elevaetd wbc and hx blood instool  onset after implnaon removal consdier colitis /infection endometriosis . - Plan: POC Urinalysis Dipstick, Ambulatory referral to Gastroenterology  Blood in stool - Plan: Ambulatory referral to Gastroenterology  Acne vulgaris - hasnt started me yet  disc how to use  Hx: UTI (urinary tract infection) - Plan: POC Urinalysis Dipstick  -Patient advised to return or notify health care team  if symptoms worsen ,persist or new concerns arise.  Patient Instructions  Need to arrange  Seeing your GYNE to have her assess if any of your sx could be from a gyne problem .  Otherwise concern about a colitis or inflammation of the bowel that could be ongoing. And with the hx  Of blood in the stool . Also .  We may need to do  A ct scan of the abdormen pelvis  If getting worse before  You see the specialist . Contact us asap  if you get a fever or  worsening  . In the interim .     Standley Brooking. Panosh M.D.

## 2014-07-01 ENCOUNTER — Ambulatory Visit (INDEPENDENT_AMBULATORY_CARE_PROVIDER_SITE_OTHER): Payer: BLUE CROSS/BLUE SHIELD | Admitting: Physician Assistant

## 2014-07-01 ENCOUNTER — Encounter: Payer: Self-pay | Admitting: Physician Assistant

## 2014-07-01 ENCOUNTER — Telehealth: Payer: Self-pay | Admitting: Internal Medicine

## 2014-07-01 ENCOUNTER — Other Ambulatory Visit: Payer: BLUE CROSS/BLUE SHIELD

## 2014-07-01 VITALS — BP 110/78 | HR 76 | Ht 59.0 in | Wt 169.0 lb

## 2014-07-01 DIAGNOSIS — R1032 Left lower quadrant pain: Secondary | ICD-10-CM

## 2014-07-01 DIAGNOSIS — K649 Unspecified hemorrhoids: Secondary | ICD-10-CM

## 2014-07-01 NOTE — Patient Instructions (Addendum)
Please go to the basement level to our lb for the urine test.   You have been scheduled for a CT scan of the abdomen and pelvis at [facility] ([address]---this is in the same building as Press photographer).   You are scheduled on 07-07-2014 at 2:30 PM . You should arrive at 2:15 minutes prior to your appointment time for registration. Please follow the written instructions below on the day of your exam:  WARNING: IF YOU ARE ALLERGIC TO IODINE/X-RAY DYE, PLEASE NOTIFY RADIOLOGY IMMEDIATELY AT [phone]! YOU WILL BE GIVEN A 13 HOUR PREMEDICATION PREP.  1) Do not eat or drink anything after 10:30 am  (4 hours prior to your test) 2) You have been given 2 bottles of oral contrast to drink. The solution may taste  better if refrigerated, but do NOT add ice or any other liquid to this solution. Shake  well before drinking.    Drink 1 bottle of contrast @ 12:30 PM  (2 hours prior to your exam)  Drink 1 bottle of contrast @ 1:30 PM  (1 hour prior to your exam)  You may take any medications as prescribed with a small amount of water except for the following: Metformin, Glucophage, Glucovance, Avandamet, Riomet, Fortamet, Actoplus Met, Janumet, Glumetza or Metaglip. The above medications must be held the day of the exam AND 48 hours after the exam.  The purpose of you drinking the oral contrast is to aid in the visualization of your intestinal tract. The contrast solution may cause some diarrhea. Before your exam is started, you will be given a small amount of fluid to drink. Depending on your individual set of symptoms, you may also receive an intravenous injection of x-ray contrast/dye. Plan on being at [facility] for 30 minutes or long, depending on the type of exam you are having performed.  If you have any questions regarding your exam or if you need to reschedule, you may call the CT department at [phone] between the hours of 8:00 am and 5:00 pm,  Monday-Friday.  ________________________________________________________________________

## 2014-07-01 NOTE — Progress Notes (Signed)
Agree with initial assessment and plans as outlined 

## 2014-07-01 NOTE — Telephone Encounter (Signed)
Pt said she is going to have a CT scan done next 07/07/14

## 2014-07-01 NOTE — Progress Notes (Signed)
Patient ID: Cheryl Walters, female   DOB: [birth year], 24 y.o.   MRN: [id number]   Subjective:    Patient ID: Cheryl Walters, female    DOB: 1990-08-19, 24 y.o.   MRN: [id number]  HPI Cheryl Walters is a pleasant 24 year old female referred by Dr. Regis Bill  today for evaluation of left lower quadrant pain and small volume hematochezia. Patient is generally in good health. She says she has always had irregular menstrual periods. Patient had previously had an subcutaneous implant device for birth control and that was removed in December 2015. Patient presents now stating that she's been having left lower quadrant abdominal pain over the past month. She describes it as a crampy or contraction-like feeling which is intermittent throughout the day. She says she is aware of discomfort all day long that sometimes the pain is worse than others. Not noted any change in her appetite, weight has been stable no nausea or vomiting. Eating does not exacerbate the pain. She says she may feel some increased discomfort just prior to a bowel movement or for straining with bowel movement. His had no changes in her bowel habits. No dysuria. She says that she has had intermittent hemorrhoidal bleeding over the past 4 years ever since she delivered her son. She says she can feel a hemorrhoid that will protrude out intermittently. She says this is always small amounts of bright red blood and may be present on the tissue or in the commode. He has had no change in this pattern at all and did not coincide with onset of her left lower quadrant pain. Labs had been done on 06/11/2014 WBC was 14.4 hemoglobin was 15.4. She  has not had any abdominal imaging  Review of Systems Pertinent positive and negative review of systems were noted in the above HPI section.  All other review of systems was otherwise negative.  Outpatient Encounter Prescriptions as of 07/01/2014  Medication Sig  . adapalene (DIFFERIN) 0.1 % cream Apply topically at  bedtime. Pea sized amount to affected area  As directed  . clindamycin-benzoyl peroxide (BENZACLIN) gel Apply topically    1-2 x per day for acne   No Known Allergies Patient Active Problem List   Diagnosis Date Noted  . Blood in stool 06/25/2014  . Acne vulgaris 06/25/2014  . Abdominal cramping 06/11/2014  . Insomnia 06/11/2014  . 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 Itching 10/07/2013  . Muscle spasm of back 03/18/2013  . Chronic rhinitis 08/13/2011  . Headache 08/13/2011  . 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 CHEST PAIN 12/09/2008  . IRREGULAR MENSTRUAL CYCLE 07/31/2007  . ACNE VULGARIS 07/31/2007   History   Social History  . Marital Status: Single    Spouse Name: N/A  . Number of Children: N/A  . Years of Education: N/A   Occupational History  . Not on file.   Social History Main Topics  . Smoking status: Never Smoker   . Smokeless tobacco: Never Used  . Alcohol Use: 0.0 oz/week    0 Standard drinks or equivalent per week     Comment: 4-6 beers 3 x week  . Drug Use: No  . Sexual Activity: Not on file   Other Topics Concern  . Not on file   Social History Narrative   [place]    Was Living at home    [facility]   Played basketball in HS no CV pulm signs   Childbirth in January 2012   Shift work  [employer]    5 days per week   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[employer]   Not working out of home at this time    NOw living iwht in [employer], hospital and 4 years okd husband    Small quarters at home all day with 26 yo    To go to pre k if gets accepted  Neg  ocass etoh             Ms. Hildenbrand family history includes Brain cancer in her paternal aunt; Breast cancer in her maternal aunt; Other in her mother and sister; Sleep apnea in her father. There is no history of Colon cancer, Throat cancer, Kidney disease, Liver disease, Diabetes, or Heart disease.      Objective:    Filed Vitals:   07/01/14 1022  BP: 110/78  Pulse: 76     Physical Exam  Well-developed young female in no acute distress, blood pressure 110/78 pulse 76 height 4 foot 11 weight 169. HEENT; nontraumatic normocephalic EOMI PERRLA sclera anicteric, Cardiovascular; regular rate and rhythm with S1-S2 no murmur or gallop, Pulmonary; clear bilaterally, Abdomen; soft nondistended bowel sounds are active there is no palpable mass or hepatosplenomegaly she is tender in the deep left lower quadrant and suprapubic area there is no guarding or rebound or Rectal; exam not done, Extremities; no clubbing cyanosis or edema skin warm and dry, Psych; mood and affect appropriate      Assessment & Plan:   #1  24 yo female with 4-6 weeks hx of persistent LLQ pain-concerned she may have an ovarian cyst. She has not had a menstrual cycle over the past 6 weeks but has always been irregular, she also had removal of a birth control device in December 2015. Rule out other intra-abdominal  inflammatory process doubt IBD  #2 small volume hematochezia 4 years secondary to known hemorrhoid. No change in pattern of the bleeding over many years  Plan; urine pregnancy test Labs from 06/11/2014 reviewed and unremarkable with the exception of a WBC of 14.4 Schedule for CT scan of the abdomen and pelvis with contrast. Further plans pending results of above  CC; Dr. Shanon Ace   Cheryl Stretch S Mayara Paulson PA-C 07/01/2014

## 2014-07-02 LAB — PREGNANCY, URINE: Preg Test, Ur: NEGATIVE

## 2014-07-02 NOTE — Telephone Encounter (Signed)
Noted  Per Gi note.

## 2014-07-07 ENCOUNTER — Ambulatory Visit (INDEPENDENT_AMBULATORY_CARE_PROVIDER_SITE_OTHER)
Admission: RE | Admit: 2014-07-07 | Discharge: 2014-07-07 | Disposition: A | Discharge status: Home / Self Care | Payer: BLUE CROSS/BLUE SHIELD | Source: Ambulatory Visit | Attending: Physician Assistant | Admitting: Physician Assistant

## 2014-07-07 DIAGNOSIS — R1032 Left lower quadrant pain: Secondary | ICD-10-CM

## 2014-07-07 MED ORDER — IOHEXOL 300 MG/ML  SOLN
100.0000 mL | Freq: Once | INTRAMUSCULAR | Status: AC | PRN
  Administered 2014-07-07: 100 mL via INTRAVENOUS

## 2014-07-08 ENCOUNTER — Telehealth: Payer: Self-pay

## 2014-07-08 NOTE — Telephone Encounter (Signed)
New patient seen by Amy. She wants to see a female GI doctor and is asking to change to Dr Olevia Perches. Okay with you?

## 2014-07-12 ENCOUNTER — Ambulatory Visit: Payer: BLUE CROSS/BLUE SHIELD | Admitting: Internal Medicine

## 2014-07-12 NOTE — Telephone Encounter (Signed)
Sure

## 2014-07-13 NOTE — Telephone Encounter (Signed)
OK 

## 2014-07-13 NOTE — Telephone Encounter (Signed)
Dr Olevia Perches, can you take this patient as yours.

## 2014-07-14 ENCOUNTER — Other Ambulatory Visit: Payer: Self-pay

## 2014-07-14 DIAGNOSIS — R9389 Abnormal findings on diagnostic imaging of other specified body structures: Secondary | ICD-10-CM

## 2014-07-16 ENCOUNTER — Ambulatory Visit: Payer: BLUE CROSS/BLUE SHIELD | Admitting: Internal Medicine

## 2014-07-19 ENCOUNTER — Encounter: Payer: BLUE CROSS/BLUE SHIELD | Admitting: Internal Medicine

## 2014-07-28 ENCOUNTER — Ambulatory Visit (AMBULATORY_SURGERY_CENTER): Payer: Self-pay

## 2014-07-28 VITALS — Ht 59.0 in | Wt 165.0 lb

## 2014-07-28 DIAGNOSIS — R1032 Left lower quadrant pain: Secondary | ICD-10-CM

## 2014-07-28 MED ORDER — MOVIPREP 100 G PO SOLR: 1.0000 | Freq: Once | ORAL | Status: DC

## 2014-07-28 NOTE — Progress Notes (Signed)
No allergies to eggs or soy No home oxygen No past problems with anesthesia No diet/weight loss meds  Has email.  Emmi instructions given for colonoscopy.

## 2014-07-29 ENCOUNTER — Encounter: Payer: Self-pay | Admitting: Internal Medicine

## 2014-08-09 ENCOUNTER — Ambulatory Visit (AMBULATORY_SURGERY_CENTER): Payer: BLUE CROSS/BLUE SHIELD | Admitting: Internal Medicine

## 2014-08-09 ENCOUNTER — Encounter: Payer: Self-pay | Admitting: Internal Medicine

## 2014-08-09 VITALS — BP 108/73 | HR 74 | Temp 98.3°F | Resp 18 | Ht 59.0 in | Wt 165.0 lb

## 2014-08-09 DIAGNOSIS — K6289 Other specified diseases of anus and rectum: Secondary | ICD-10-CM | POA: Diagnosis not present

## 2014-08-09 DIAGNOSIS — K921 Melena: Secondary | ICD-10-CM

## 2014-08-09 DIAGNOSIS — R1032 Left lower quadrant pain: Secondary | ICD-10-CM

## 2014-08-09 MED ORDER — SODIUM CHLORIDE 0.9 % IV SOLN: 500.0000 mL | INTRAVENOUS | Status: DC

## 2014-08-09 MED ORDER — HYDROCORTISONE ACETATE 25 MG RE SUPP: 25.0000 mg | Freq: Every evening | RECTAL | Status: DC | PRN

## 2014-08-09 MED ORDER — MESALAMINE 1000 MG RE SUPP: 1000.0000 mg | Freq: Every day | RECTAL | Status: DC

## 2014-08-09 NOTE — Progress Notes (Signed)
A/ox3 pleased with MAC, report to [employer]

## 2014-08-09 NOTE — Patient Instructions (Signed)
YOU HAD AN ENDOSCOPIC PROCEDURE TODAY AT [facility]:   Refer to the procedure report that was given to you for any specific questions about what was found during the examination.  If the procedure report does not answer your questions, please call your gastroenterologist to clarify.  If you requested that your care partner not be given the details of your procedure findings, then the procedure report has been included in a sealed envelope for you to review at your convenience later.  YOU SHOULD EXPECT: Some feelings of bloating in the abdomen. Passage of more gas than usual.  Walking can help get rid of the air that was put into your GI tract during the procedure and reduce the bloating. If you had a lower endoscopy (such as a colonoscopy or flexible sigmoidoscopy) you may notice spotting of blood in your stool or on the toilet paper. If you underwent a bowel prep for your procedure, you may not have a normal bowel movement for a few days.  Please Note:  You might notice some irritation and congestion in your nose or some drainage.  This is from the oxygen used during your procedure.  There is no need for concern and it should clear up in a day or so.  SYMPTOMS TO REPORT IMMEDIATELY:   Following lower endoscopy (colonoscopy or flexible sigmoidoscopy):  Excessive amounts of blood in the stool  Significant tenderness or worsening of abdominal pains  Swelling of the abdomen that is new, acute  Fever of 100F or higher   For urgent or emergent issues, a gastroenterologist can be reached at any hour by calling [phone].   DIET: Your first meal following the procedure should be a small meal and then it is ok to progress to your normal diet. Heavy or fried foods are harder to digest and may make you feel nauseous or bloated.  Likewise, meals heavy in dairy and vegetables can increase bloating.  Drink plenty of fluids but you should avoid alcoholic beverages for 24  hours.  ACTIVITY:  You should plan to take it easy for the rest of today and you should NOT DRIVE or use heavy machinery until tomorrow (because of the sedation medicines used during the test).    FOLLOW UP: Our staff will call the number listed on your records the next business day following your procedure to check on you and address any questions or concerns that you may have regarding the information given to you following your procedure. If we do not reach you, we will leave a message.  However, if you are feeling well and you are not experiencing any problems, there is no need to return our call.  We will assume that you have returned to your regular daily activities without incident.  If any biopsies were taken you will be contacted by phone or by letter within the next 1-3 weeks.  Please call us at [phone] if you have not heard about the biopsies in 3 weeks.    SIGNATURES/CONFIDENTIALITY: You and/or your care partner have signed paperwork which will be entered into your electronic medical record.  These signatures attest to the fact that that the information above on your After Visit Summary has been reviewed and is understood.  Full responsibility of the confidentiality of this discharge information lies with you and/or your care-partner.  Recommendations Discharge instructions given to patient and/or care partner. Follow up appointment in 4-6 weeks. Await pathology results.

## 2014-08-09 NOTE — Progress Notes (Signed)
Called to room to assist during endoscopic procedure.  Patient ID and intended procedure confirmed with present staff. Received instructions for my participation in the procedure from the performing physician.  

## 2014-08-09 NOTE — Op Note (Signed)
[place]  [address] [place], [id number]   COLONOSCOPY PROCEDURE REPORT  PATIENT: Cheryl Walters, Cheryl Walters  MR#: [id number] BIRTHDATE: [birth year] , 23  yrs. old GENDER: female ENDOSCOPIST: Lafayette Dragon, MD REFERRED JS:RPRXY Darnelle Going, M.D. PROCEDURE DATE:  08/09/2014 PROCEDURE:   Colonoscopy, diagnostic and Colonoscopy with hot biopsy/bipolar First Screening Colonoscopy - Avg.  risk and is 50 yrs.  old or older - No.  Prior Negative Screening - Now for repeat screening. N/A  History of Adenoma - Now for follow-up colonoscopy & has been > or = to 3 yrs.  N/A ASA CLASS:   Class I INDICATIONS:Evaluation of unexplained GI bleeding, Colorectal Neoplasm Risk Assessment for this procedure is average risk, and painless low-volume hematochezia.  Left lower quadrant abdominal pain.  Abnormal CT scan of the abdomen and pelvis indicating enlarged lymph nodes. MEDICATIONS: Monitored anesthesia care and Propofol 300 mg IV  DESCRIPTION OF PROCEDURE:   After the risks benefits and alternatives of the procedure were thoroughly explained, informed consent was obtained.  The digital rectal exam revealed no abnormalities of the rectum.   The LB [id number]-[id number]  endoscope was introduced through the anus and advanced to the cecum, which was identified by both the appendix and ileocecal valve. No adverse events experienced.   The quality of the prep was good.  (MoviPrep was used)  The instrument was then slowly withdrawn as the colon was fully examined.      COLON FINDINGS: A circumferential diffuse patch of abnormal mucosa was found in the rectum.  The mucosa was edematous, friable and had granularity.  This was likely consistent with Inflammatory Bowel disease.  Multiple biopsies were performed using cold forceps. Retroflexed views revealed no abnormalities. The time to cecum = 3.22 Withdrawal time = 10.01   The scope was withdrawn and the procedure  completed. COMPLICATIONS: There were no immediate complications.  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  Await pathology results 2.  Hydrocortisone supp 25 mg, qhs 3. follow-up appointment in 4-6 weeks   ( Canasa supp preferrable but too expensive)  eSigned:  Lafayette Dragon, MD 08/09/2014 4:35 PM   cc:

## 2014-08-10 ENCOUNTER — Telehealth: Payer: Self-pay

## 2014-08-10 ENCOUNTER — Encounter: Payer: Self-pay | Admitting: *Deleted

## 2014-08-10 NOTE — Telephone Encounter (Signed)
  Follow up Call-  Call back number 08/09/2014  Post procedure Call Back phone  # [phone]  Permission to leave phone message Yes     Patient questions:  Do you have a fever, pain , or abdominal swelling? No. Pain Score  0 *  Have you tolerated food without any problems? Yes.    Have you been able to return to your normal activities? Yes.    Do you have any questions about your discharge instructions: Diet   No. Medications  No. Follow up visit  No.  Do you have questions or concerns about your Care? No.  Actions: * If pain score is 4 or above: No action needed, pain <4.

## 2014-08-16 ENCOUNTER — Encounter: Payer: Self-pay | Admitting: Internal Medicine

## 2014-09-21 ENCOUNTER — Ambulatory Visit: Payer: BLUE CROSS/BLUE SHIELD | Admitting: Internal Medicine

## 2014-11-02 ENCOUNTER — Ambulatory Visit: Payer: BLUE CROSS/BLUE SHIELD | Admitting: Internal Medicine

## 2015-07-26 ENCOUNTER — Encounter: Payer: Self-pay | Admitting: *Deleted

## 2015-07-26 ENCOUNTER — Encounter: Payer: Self-pay | Admitting: Family Medicine

## 2015-07-26 ENCOUNTER — Ambulatory Visit (INDEPENDENT_AMBULATORY_CARE_PROVIDER_SITE_OTHER): Payer: BLUE CROSS/BLUE SHIELD | Admitting: Family Medicine

## 2015-07-26 VITALS — BP 110/68 | HR 75 | Temp 98.3°F | Ht 59.0 in | Wt 168.3 lb

## 2015-07-26 DIAGNOSIS — N926 Irregular menstruation, unspecified: Secondary | ICD-10-CM

## 2015-07-26 DIAGNOSIS — R51 Headache: Secondary | ICD-10-CM

## 2015-07-26 DIAGNOSIS — J029 Acute pharyngitis, unspecified: Secondary | ICD-10-CM | POA: Diagnosis not present

## 2015-07-26 DIAGNOSIS — R519 Headache, unspecified: Secondary | ICD-10-CM

## 2015-07-26 LAB — POCT URINE PREGNANCY: Preg Test, Ur: NEGATIVE

## 2015-07-26 LAB — POCT RAPID STREP A (OFFICE): Rapid Strep A Screen: NEGATIVE

## 2015-07-26 NOTE — Progress Notes (Signed)
Pre visit review using our clinic review tool, if applicable. No additional management support is needed unless otherwise documented below in the visit note. 

## 2015-07-26 NOTE — Addendum Note (Signed)
Addended by: Agnes Lawrence on: 07/26/2015 03:59 PM   Modules accepted: Orders

## 2015-07-26 NOTE — Progress Notes (Signed)
HPI:  Cheryl Walters is a pleasant 25 year old here for an acute visit for a headache. She reports a long history of headaches, but they had been better until the last few days. Reports for the last 3 days she has had a sore throat and the morning, mild nausea and a headache daily. She is feeling a little better today but still has a mild headache she denies fevers, vomiting, diarrhea, shortness of breath, coughing, vaginal discharge, dysuria, vision changes, neck stiffness, lethargy or diarrhea. She reports her periods tend to be irregular and her last menstrual period was in January. She reports a family history of migraines in her mother. She usually takes over-the-counter options such as Aleve for her headaches, but they usually do not work. She did stop caffeine suddenly several days ago.   ROS: See pertinent positives and negatives per HPI.  Past Medical History  Diagnosis Date  . Headache(784.0)   . Pilonidal cyst   . Acne   . Irregular periods     Past Surgical History  Procedure Laterality Date  . Cesarean section    . Birth control implant      Family History  Problem Relation Age of Onset  . Other Mother     irreg periods  . Other Sister     irreg periods  . Breast cancer Maternal Aunt   . Brain cancer Paternal Aunt   . Sleep apnea Father     on machine  . Colon cancer Neg Hx   . Throat cancer Neg Hx   . Kidney disease Neg Hx   . Liver disease Neg Hx   . Diabetes Neg Hx   . Heart disease Neg Hx     Social History   Social History  . Marital Status: Married    Spouse Name: N/A  . Number of Children: N/A  . Years of Education: N/A   Social History Main Topics  . Smoking status: Never Smoker   . Smokeless tobacco: Never Used  . Alcohol Use: 0.0 oz/week    0 Standard drinks or equivalent per week     Comment: 3 every other week  . Drug Use: No  . Sexual Activity: Not Asked   Other Topics Concern  . 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  adapalene (DIFFERIN) 0.1 % cream, Apply topically at bedtime. Pea sized amount to affected area  As directed, Disp: 30 g, Rfl: 2 .  clindamycin-benzoyl peroxide (BENZACLIN) gel, Apply topically    1-2 x per day for acne, Disp: 25 g, Rfl: 1  EXAM:  Filed Vitals:   07/26/15 1516  BP: 110/68  Pulse: 75  Temp: 98.3 F (36.8 C)    Body mass index is 33.97  kg/(m^2).  GENERAL: vitals reviewed and listed above, alert, oriented, appears well hydrated and in no acute distress  HEENT: atraumatic, conjunttiva clear, PERRLA, EOMI,  no obvious abnormalities on inspection of external nose and ears, normal appearance of ear canals and TMs, clear nasal congestion, mild post oropharyngeal erythema with PND, positive tonsillar edema, no sinus TTP  NECK: no obvious masses on inspection, no meningeal signs  LUNGS: clear to auscultation bilaterally, no wheezes, rales or rhonchi, good air movement  CV: HRRR, no peripheral edema  MS: moves all extremities without noticeable abnormality  PSYCH/NEURO: CN II-XII grossly intact, finger to nose normal, speech and thought processing grossly intact, pleasant and cooperative, no obvious depression or anxiety  ASSESSMENT AND PLAN:  Discussed the following assessment and plan:  Acute nonintractable headache, unspecified headache type  Sore throat  Missed period  -Suspect viral infection versus strep throat most likely given symptoms and findings on exam. She may have underlying migraine  disorder, but reports prior to the last several days her headaches had been infrequent recently. Rapid strep and culture pending and will treat appropriately. Otherwise advised supportive care measures and follow-up if symptoms worsen or persist. Urine pregnancy test negative. Advised follow-up with PCP if persistent irregular periods. Also suggested weaning slowly off the caffeine as sudden cessation could have triggered her headache. Trial Excedrin she has another similar headache in the future. Advised follow-up with PCP if frequent or persistent headaches and advised to return or notify a doctor immediately if symptoms worsen or persist or new concerns arise.  Patient Instructions  Corliss Blacker of fluids and insure you are getting adequate rest.  Wean slowly off of caffeine instead of stopping suddenly.  Keep track of headaches and try Excedrin if you have another headache.  Follow-up with your doctor if symptoms worsen, new symptoms develop or symptoms persist.     Colin Benton R.

## 2015-07-26 NOTE — Patient Instructions (Signed)
Plenty of fluids and insure you are getting adequate rest.  Wean slowly off of caffeine instead of stopping suddenly.  Keep track of headaches and try Excedrin if you have another headache.  Follow-up with your doctor if symptoms worsen, new symptoms develop or symptoms persist.

## 2015-07-28 LAB — CULTURE, GROUP A STREP: ORGANISM ID, BACTERIA: NORMAL

## 2015-08-17 ENCOUNTER — Ambulatory Visit: Payer: BLUE CROSS/BLUE SHIELD | Admitting: Internal Medicine

## 2015-08-24 ENCOUNTER — Ambulatory Visit (INDEPENDENT_AMBULATORY_CARE_PROVIDER_SITE_OTHER): Payer: BLUE CROSS/BLUE SHIELD | Admitting: Internal Medicine

## 2015-08-24 DIAGNOSIS — R69 Illness, unspecified: Secondary | ICD-10-CM

## 2015-08-24 NOTE — Progress Notes (Signed)
Document opened and reviewed forov  . No showed .

## 2015-10-12 ENCOUNTER — Telehealth: Payer: Self-pay | Admitting: Internal Medicine

## 2015-10-12 NOTE — Telephone Encounter (Signed)
Pt needs appointment tomorrow in your 9:15 same day slot. Pt has to be at work at leave here by 10:15 am.  Pt had a bump that started under her arm, now has swollen her arm up and it is difficult to move.  Ok to use?

## 2015-10-12 NOTE — Telephone Encounter (Signed)
Ok but just  saw the message after my clinic finished

## 2015-10-13 ENCOUNTER — Encounter: Payer: Self-pay | Admitting: Family Medicine

## 2015-10-13 ENCOUNTER — Ambulatory Visit (INDEPENDENT_AMBULATORY_CARE_PROVIDER_SITE_OTHER): Payer: BLUE CROSS/BLUE SHIELD | Admitting: Family Medicine

## 2015-10-13 VITALS — BP 110/82 | HR 78 | Temp 98.2°F | Ht 59.0 in | Wt 170.6 lb

## 2015-10-13 DIAGNOSIS — L732 Hidradenitis suppurativa: Secondary | ICD-10-CM | POA: Diagnosis not present

## 2015-10-13 MED ORDER — CLINDAMYCIN PHOSPHATE 1 % EX GEL: Freq: Two times a day (BID) | CUTANEOUS | Status: DC

## 2015-10-13 MED ORDER — DOXYCYCLINE HYCLATE 100 MG PO TABS: 100.0000 mg | ORAL_TABLET | Freq: Two times a day (BID) | ORAL | Status: DC

## 2015-10-13 NOTE — Telephone Encounter (Signed)
Pt scheduled to see Dr.Kim at 4:00 pm today per Juliann Pulse.

## 2015-10-13 NOTE — Patient Instructions (Addendum)
BEFORE YOU LEAVE: -follow up: As needed  Take the antibiotic, doxycycline, as prescribed. Do not get in the sun while taking this medication.  Warm compresses several times daily for 5 minutes.  Once finished with the doxycycline, use the topical clindamycin gel as prescribed for 3 months.  Seek care immediately if redness or swelling increases, you have redness or swelling that is spreading, fevers, you are feeling sick or you are worsening despite treatment.  You can use the gel as instructed to try to prevent recurrence of these boils.

## 2015-10-13 NOTE — Progress Notes (Signed)
Pre visit review using our clinic review tool, if applicable. No additional management support is needed unless otherwise documented below in the visit note. 

## 2015-10-13 NOTE — Progress Notes (Addendum)
HPI:  Acute visit for:  "bumps under arm" -intermittent, painful bumps under arms and in bikini region for years, several times per years -bumps under L arm for a week, not going away -never used tx for these, usually resolve on their own -no fevers, malaise, streaking -fdlmp: a few weeks ago, reports no chance of pregnancy  ROS: See pertinent positives and negatives per HPI.  Past Medical History  Diagnosis Date  . Headache(784.0)   . Pilonidal cyst   . Acne   . Irregular periods     Past Surgical History  Procedure Laterality Date  . Cesarean section    . Birth control implant      Family History  Problem Relation Age of Onset  . Other Mother     irreg periods  . Other Sister     irreg periods  . Breast cancer Maternal Aunt   . Brain cancer Paternal Aunt   . Sleep apnea Father     on machine  . Colon cancer Neg Hx   . Throat cancer Neg Hx   . Kidney disease Neg Hx   . Liver disease Neg Hx   . Diabetes Neg Hx   . Heart disease Neg Hx     Social History   Social History  . Marital Status: Married    Spouse Name: N/A  . Number of Children: N/A  . Years of Education: N/A   Social History Main Topics  . Smoking status: Never Smoker   . Smokeless tobacco: Never Used  . Alcohol Use: 0.0 oz/week    0 Standard drinks or equivalent per week     Comment: 3 every other week  . Drug Use: No  . Sexual Activity: Not Asked   Other Topics Concern  . None   Social History Narrative   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   adapalene (DIFFERIN) 0.1 % cream, Apply topically at bedtime. Pea sized amount to affected area  As directed, Disp: 30 g, Rfl: 2 .  clindamycin-benzoyl peroxide (BENZACLIN) gel, Apply topically    1-2 x per day for acne, Disp: 25 g, Rfl: 1 .  clindamycin (CLINDAGEL) 1 % gel, Apply topically 2 (two) times daily., Disp: 60 g, Rfl: 1 .  doxycycline (VIBRA-TABS) 100 MG tablet, Take 1 tablet (100 mg total) by mouth 2 (two) times daily., Disp: 14 tablet, Rfl: 0  EXAM:  Filed Vitals:   10/13/15 1624  BP: 110/82  Pulse: 78  Temp: 98.2 F (36.8 C)    Body mass index is 34.44 kg/(m^2).  GENERAL: vitals reviewed and listed above, alert, oriented, appears well hydrated and in no acute distress  HEENT: atraumatic, conjunttiva clear, no obvious abnormalities on inspection of external nose and ears  NECK: no obvious masses on  inspection  LUNGS: clear to auscultation bilaterally, no wheezes, rales or rhonchi, good air movement  CV: HRRR, no peripheral edema  SKIN: two inflamed nudules L axillary region, bridged scar overlying one, no fluctuance or surrounding erythema or dranage  MS: moves all extremities without noticeable abnormality  PSYCH: pleasant and cooperative, no obvious depression or anxiety  ASSESSMENT AND PLAN:  Discussed the following assessment and plan:  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  Patient Instructions  BEFORE YOU LEAVE: -follow up: As needed  Take the antibiotic, doxycycline, as prescribed. Do not get in the sun while taking this medication.  Warm compresses several times daily for 5 minutes.  Once finished with the doxycycline, use the topical clindamycin gel as prescribed for 3 months.  Seek care  immediately if redness or swelling increases, you have redness or swelling that is spreading, fevers, you are feeling sick or you are worsening despite treatment.  You can use the gel as instructed to try to prevent recurrence of these boils.        Colin Benton R.

## 2015-10-27 ENCOUNTER — Ambulatory Visit (INDEPENDENT_AMBULATORY_CARE_PROVIDER_SITE_OTHER): Payer: BLUE CROSS/BLUE SHIELD | Admitting: Family Medicine

## 2015-10-27 ENCOUNTER — Encounter: Payer: Self-pay | Admitting: Family Medicine

## 2015-10-27 VITALS — BP 100/74 | HR 100 | Temp 100.6°F | Ht 59.0 in | Wt 169.2 lb

## 2015-10-27 DIAGNOSIS — J029 Acute pharyngitis, unspecified: Secondary | ICD-10-CM | POA: Diagnosis not present

## 2015-10-27 LAB — POCT RAPID STREP A (OFFICE): Rapid Strep A Screen: NEGATIVE

## 2015-10-27 NOTE — Progress Notes (Signed)
HPI:  Acute visit for sore throat: -started: yesterday -symptoms:sore throat, low grade fever, headache yeasterday -denies:SOB, NVD, tooth pain, difficulty swallowing, high risk sexual behaviors or any concerns for STIs after discussion risks -has tried: tylenol yesterday which helped -sick contacts/travel/risks: no reported flu, strep or tick exposure  ROS: See pertinent positives and negatives per HPI.  Past Medical History  Diagnosis Date  . Headache(784.0)   . Pilonidal cyst   . Acne   . Irregular periods     Past Surgical History  Procedure Laterality Date  . Cesarean section    . Birth control implant      Family History  Problem Relation Age of Onset  . Other Mother     irreg periods  . Other Sister     irreg periods  . Breast cancer Maternal Aunt   . Brain cancer Paternal Aunt   . Sleep apnea Father     on machine  . Colon cancer Neg Hx   . Throat cancer Neg Hx   . Kidney disease Neg Hx   . Liver disease Neg Hx   . Diabetes Neg Hx   . Heart disease Neg Hx     Social History   Social History  . Marital Status: Married    Spouse Name: N/A  . Number of Children: N/A  . Years of Education: N/A   Social History Main Topics  . Smoking status: Never Smoker   . Smokeless tobacco: Never Used  . Alcohol Use: 0.0 oz/week    0 Standard drinks or equivalent per week     Comment: 3 every other week  . Drug Use: No  . Sexual Activity: Not Asked   Other Topics Concern  . 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  adapalene (DIFFERIN) 0.1 % cream, Apply topically at bedtime. Pea sized amount to affected area  As directed, Disp: 30 g, Rfl: 2 .  clindamycin (CLINDAGEL) 1 % gel, Apply topically 2 (two) times daily., Disp: 60 g, Rfl: 1 .  clindamycin-benzoyl peroxide (BENZACLIN) gel, Apply topically    1-2 x per day for acne, Disp: 25 g, Rfl: 1  EXAM:  Filed Vitals:   10/27/15 1516  BP: 100/74  Pulse: 100  Temp: 100.6 F (38.1 C)    Body mass index is 34.16 kg/(m^2).  GENERAL: vitals reviewed and listed above, alert, oriented, appears well hydrated and in no acute distress  HEENT: atraumatic, conjunttiva clear, no obvious abnormalities on inspection of external nose and ears, normal appearance of ear canals and TMs, clear nasal congestion, mild post oropharyngeal erythema with PND, 1+ tonsillar edema w/ tonsillith on L, no sinus TTP  NECK: no obvious masses on inspection  LUNGS: clear to auscultation  bilaterally, no wheezes, rales or rhonchi, good air movement  CV: HRRR, no peripheral edema  MS: moves all extremities without noticeable abnormality  PSYCH: pleasant and cooperative, no obvious depression or anxiety  ASSESSMENT AND PLAN:  Discussed the following assessment and plan:  Sore throat - Plan: POC Rapid Strep A, Culture, Group A Strep  -given HPI and exam findings today, a serious infection or illness is unlikely. We discussed potential etiologies, with viral pharyngitis being most likely, and advised supportive care and monitoring. Will obtian rapid strep and culture to r/o strep and tx if positive. We discussed treatment side effects, likely course, antibiotic misuse, transmission, and signs of developing a serious illness. -of course, we advised to return or notify a doctor immediately if symptoms worsen or persist or new concerns arise.    Patient Instructions  BEFORE YOU LEAVE: -Rapid strep test and culture  Salt water gargles for sore throat  Aleve or  ibuprofen as needed per instructions for pain and fevers.  Follow-up if worsening, not improving as expected or new concerns.        Colin Benton R.

## 2015-10-27 NOTE — Progress Notes (Signed)
Pre visit review using our clinic review tool, if applicable. No additional management support is needed unless otherwise documented below in the visit note. 

## 2015-10-27 NOTE — Patient Instructions (Signed)
BEFORE YOU LEAVE: -Rapid strep test and culture  Salt water gargles for sore throat  Aleve or ibuprofen as needed per instructions for pain and fevers.  Follow-up if worsening, not improving as expected or new concerns.

## 2015-10-29 LAB — CULTURE, GROUP A STREP

## 2015-10-30 ENCOUNTER — Other Ambulatory Visit: Payer: Self-pay | Admitting: Family Medicine

## 2015-10-30 MED ORDER — AMOXICILLIN 500 MG PO CAPS: 500.0000 mg | ORAL_CAPSULE | Freq: Two times a day (BID) | ORAL | Status: AC

## 2015-10-31 ENCOUNTER — Telehealth: Payer: Self-pay | Admitting: Internal Medicine

## 2015-10-31 NOTE — Telephone Encounter (Signed)
See results note. 

## 2015-10-31 NOTE — Telephone Encounter (Signed)
Pt returning your call

## 2016-03-06 ENCOUNTER — Telehealth: Payer: Self-pay | Admitting: Internal Medicine

## 2016-03-06 NOTE — Telephone Encounter (Signed)
Pt has 2 moles under her arm, smaller than a dime and wants to know if Dr Regis Bill will remove them. Thy are irritating and bothering

## 2016-03-06 NOTE — Telephone Encounter (Signed)
Please schedule the pt for a 30 minute appointment.  The moles will need to be evaluated by Dr. Mamie Nick to see if she can/should remove the moles or if a referral to dermatology is needed.

## 2016-03-08 NOTE — Telephone Encounter (Signed)
Pt scheduled  

## 2016-03-09 ENCOUNTER — Encounter: Payer: Self-pay | Admitting: Internal Medicine

## 2016-03-09 ENCOUNTER — Ambulatory Visit (INDEPENDENT_AMBULATORY_CARE_PROVIDER_SITE_OTHER): Payer: 59 | Admitting: Internal Medicine

## 2016-03-09 VITALS — BP 118/84 | Temp 98.4°F | Wt 176.0 lb

## 2016-03-09 DIAGNOSIS — L989 Disorder of the skin and subcutaneous tissue, unspecified: Secondary | ICD-10-CM

## 2016-03-09 DIAGNOSIS — L918 Other hypertrophic disorders of the skin: Secondary | ICD-10-CM | POA: Diagnosis not present

## 2016-03-09 NOTE — Progress Notes (Signed)
Chief Complaint  Patient presents with  . Skin Tag Removal    Rt axilla.  Causing pain and discomfort.    HPI: Cheryl Walters 25 y.o.  Has skin tag mole in the right axilla keeps getting hit and is now quite tender. No fever redness has shaved over the area at times. The small and isn't bothering her at this time. ROS: See pertinent positives and negatives per HPI.  Past Medical History:  Diagnosis Date  . Acne   . Headache(784.0)   . Irregular periods   . Pilonidal cyst     Family History  Problem Relation Age of Onset  . Other Mother     irreg periods  . Other Sister     irreg periods  . Breast cancer Maternal Aunt   . Brain cancer Paternal Aunt   . Sleep apnea Father     on machine  . Colon cancer Neg Hx   . Throat cancer Neg Hx   . Kidney disease Neg Hx   . Liver disease Neg Hx   . Diabetes Neg Hx   . Heart disease Neg Hx     Social History   Social History  . Marital status: Married    Spouse name: N/A  . Number of children: N/A  . Years of education: N/A   Social History Main Topics  . Smoking status: Never Smoker  . Smokeless tobacco: Never Used  . Alcohol use 0.0 oz/week     Comment: 3 every other week  . Drug use: No  . Sexual activity: Not Asked   Other Topics Concern  . 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 adapalene (DIFFERIN) 0.1 % cream Apply topically at bedtime. Pea sized amount to affected area  As directed 30 g 2  . clindamycin  (CLINDAGEL) 1 % gel Apply topically 2 (two) times daily. 60 g 1  . clindamycin-benzoyl peroxide (BENZACLIN) gel Apply topically    1-2 x per day for acne 25 g 1   No facility-administered medications prior to visit.      EXAM:  BP 118/84 (BP Location: Left Arm, Patient Position: Sitting, Cuff Size: Normal)   Temp 98.4 F (36.9 C) (Oral)   Wt 176 lb (79.8 kg)   BMI 35.55 kg/m   Body mass index is 35.55 kg/m.  GENERAL: vitals reviewed and listed above, alert, oriented, appears well hydrated and in no acute distress Right axilla large swollen when 3-4 mm acrochordon it's tender. Smaller tag nearby. DEXA was a bit tender but there is no redness adenopathy. MS: moves all extremities without noticeable focal  abnormality PSYCH: pleasant and cooperative, no obvious depression or anxiety Procedure note after discussion with  patient no allergy cleaned with Betadine three-quarter cc 2% with epi under the larger tag small amount under the smaller tag removed with sharp scissors hemostasis with pressure and Drysol. Tolerated well. Nonstick bandage. ASSESSMENT AND PLAN:  Discussed the following assessment and plan:  Inflamed skin tag - r ight axilla indicated removal   Painful skin lesion Discussed signs of alarm local care recheck as needed this could come back but is not high risk. -Patient advised to return or notify health care team  if symptoms worsen ,persist or new concerns arise.  Patient Instructions  Local care  Cover clean antibiotic ointment    Avoid shaving  Over the area until healed . Seek care  If signs of infection    Redness swelling increasing pain     Cheryl Laster K. Labarron Durnin M.D.

## 2016-03-09 NOTE — Patient Instructions (Addendum)
Local care  Cover clean antibiotic ointment    Avoid shaving  Over the area until healed . Seek care  If signs of infection    Redness swelling increasing pain

## 2016-03-09 NOTE — Progress Notes (Signed)
Pre visit review using our clinic review tool, if applicable. No additional management support is needed unless otherwise documented below in the visit note. 

## 2016-03-30 ENCOUNTER — Telehealth: Payer: Self-pay | Admitting: Physician Assistant

## 2016-03-30 NOTE — Telephone Encounter (Signed)
Patient has had bright red blood after her bowel movements. She does not have pain or blood at any other time. She denies constipation or straining for bowel movements. Expresses concern and wants to be seen. Appointment set per her request. Red flag symptoms discussed.

## 2016-04-12 ENCOUNTER — Other Ambulatory Visit (INDEPENDENT_AMBULATORY_CARE_PROVIDER_SITE_OTHER): Payer: 59

## 2016-04-12 ENCOUNTER — Encounter: Payer: Self-pay | Admitting: Physician Assistant

## 2016-04-12 ENCOUNTER — Encounter (INDEPENDENT_AMBULATORY_CARE_PROVIDER_SITE_OTHER): Payer: Self-pay

## 2016-04-12 ENCOUNTER — Ambulatory Visit (INDEPENDENT_AMBULATORY_CARE_PROVIDER_SITE_OTHER): Payer: 59 | Admitting: Physician Assistant

## 2016-04-12 VITALS — BP 118/68 | HR 72 | Ht 59.0 in | Wt 176.6 lb

## 2016-04-12 DIAGNOSIS — K5120 Ulcerative (chronic) proctitis without complications: Secondary | ICD-10-CM

## 2016-04-12 DIAGNOSIS — K625 Hemorrhage of anus and rectum: Secondary | ICD-10-CM

## 2016-04-12 LAB — CBC WITH DIFFERENTIAL/PLATELET
Basophils Absolute: 0.1 10*3/uL (ref 0.0–0.1)
Basophils Relative: 0.5 % (ref 0.0–3.0)
EOS PCT: 1 % (ref 0.0–5.0)
Eosinophils Absolute: 0.1 10*3/uL (ref 0.0–0.7)
HEMATOCRIT: 38.1 % (ref 36.0–46.0)
HEMOGLOBIN: 12.8 g/dL (ref 12.0–15.0)
LYMPHS PCT: 27.1 % (ref 12.0–46.0)
Lymphs Abs: 3 10*3/uL (ref 0.7–4.0)
MCHC: 33.5 g/dL (ref 30.0–36.0)
MCV: 82.3 fl (ref 78.0–100.0)
MONOS PCT: 7.3 % (ref 3.0–12.0)
Monocytes Absolute: 0.8 10*3/uL (ref 0.1–1.0)
Neutro Abs: 7.2 10*3/uL (ref 1.4–7.7)
Neutrophils Relative %: 64.1 % (ref 43.0–77.0)
Platelets: 293 10*3/uL (ref 150.0–400.0)
RBC: 4.62 Mil/uL (ref 3.87–5.11)
RDW: 14.2 % (ref 11.5–15.5)
WBC: 11.2 10*3/uL — AB (ref 4.0–10.5)

## 2016-04-12 LAB — SEDIMENTATION RATE: Sed Rate: 11 mm/hr (ref 0–20)

## 2016-04-12 LAB — HIGH SENSITIVITY CRP: CRP, High Sensitivity: 6.71 mg/L — ABNORMAL HIGH (ref 0.000–5.000)

## 2016-04-12 MED ORDER — MESALAMINE 1000 MG RE SUPP
1000.0000 mg | Freq: Every day | RECTAL | 1 refills | Status: DC
Start: 2016-04-12 — End: 2017-05-31

## 2016-04-12 NOTE — Progress Notes (Signed)
Subjective:    Patient ID: Cheryl Walters, female    DOB: 1991/02/16, 25 y.o.   MRN: [id number]  HPI Cheryl Walters is a pleasant 25 year old female previously known to Dr. Delfin Edis. She was last seen in our office in March 2016 at which time she underwent colonoscopy her complaints of rectal bleeding. 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 Biopsies showed chronic active inflammation consistent with IBD there were no granulomas. At that time she had been given Anusol HC suppositories. She says she had done very well with intermittent scant amount of rectal bleeding until the past few months when she has noticed much more frequent rectal bleeding. She says she's probably seeing blood at least 5 days a week with her bowel movements and generally sees blood mixed in with the stool as well as on the tissue. Consent been fairly normal though she does admit to having some episodes of urgency where she passes just mucus and a small amount of blood. No complaints of rectal pain, no complaints of abdominal pain or cramping. Her appetite has been good and her weight has been stable.  She has just learned that she is pregnant, believe she's between 5 and 7 weeks as an appointment with her gynecologist this afternoon. This is her second pregnancy.  Review of Systems Pertinent positive and negative review of systems were noted in the above HPI section.  All other review of systems was otherwise negative.  Outpatient Encounter Prescriptions as of 04/12/2016  Medication Sig  . MedroxyPROGESTERone Acetate (PROVERA PO) Take by mouth.  . mesalamine (CANASA) 1000 MG suppository Place 1 suppository (1,000 mg total) rectally at bedtime.   No facility-administered encounter medications on file as of 04/12/2016.    No Known Allergies Patient Active Problem List   Diagnosis Date Noted  . Blood in stool 06/25/2014  . Acne  vulgaris 06/25/2014  . Abdominal cramping 06/11/2014  . Insomnia 06/11/2014  . 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 Itching 10/07/2013  . Muscle spasm of back 03/18/2013  . Chronic rhinitis 08/13/2011  . Headache(784.0) 08/13/2011  . 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 CHEST PAIN 12/09/2008  . IRREGULAR MENSTRUAL CYCLE 07/31/2007  . ACNE VULGARIS 07/31/2007   Social History   Social History  . Marital status: Married    Spouse name: N/A  . Number of children: N/A  . Years of education: N/A   Occupational History  . Not on file.   Social History Main Topics  . Smoking status: Never Smoker  . Smokeless tobacco: Never Used  . Alcohol use 0.0 oz/week     Comment: 3 every other week  . Drug use: No  . Sexual activity: Not on file   Other Topics Concern  . 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 Cheryl Walters family history includes Brain cancer in her paternal aunt; Breast cancer in her maternal aunt; Other in her mother and sister; Sleep apnea in her father.      Objective:    Vitals:   04/12/16 0948  BP: 118/68  Pulse: 72    Physical Exam  well-developed young African-American female in no acute distress, pleasant blood pressure 118/68 pulse 72 height 5 foot weight 158 BMI 30. HEENT; nontraumatic, cephalic EOMI PERRLA sclera anicteric, Cardiovascular; regular rate and rhythm with S1-S2 no murmur or gallop, Pulmonary ;clear bilaterally, Abdomen; soft nontender nondistended bowel sounds are active there is no palpable mass or  hepatosplenomegaly, Rectal ;exam not done, Extremities ;no clubbing cyanosis or edema skin warm and dry, Neuropsych ;mood and affect appropriate       Assessment & Plan:   #58 25 year old female with previously documented ulcerative proctitis and colonoscopy March 2016 now presenting with 2-3 month history of increased frequency of rectal bleeding, and episodes of tenesmus with passage of small amounts of mucus and blood. Symptoms consistent with mild exacerbation of ulcerative proctitis  #2 Early intrauterine pregnancy- believes she is between 5 and [redacted] weeks pregnant  Plan; Will check CBC with differential, CRP and sedimentation rate Start Canasa suppositories at bedtime, will plan 8-10 week course. Canasa lists no ultra indication with pregnancy If symptoms are persisting will need to add oral mesalamine, probably Lialda 2.4 g by mouth twice a day. We'll hold off on this for now, and try to avoid any other medications and least until she is through her first trimester She will follow up with Dr. Silverio Decamp or myself in one month and knows to call in the interim if her symptoms are worsening.    Cheryl Walters S Thirza Pellicano PA-C 04/12/2016   Cc: Burnis Medin, MD

## 2016-04-12 NOTE — Patient Instructions (Addendum)
Please go to the basement level to have your labs drawn.  We sent a prescription for Canasa suppositories to your pharmacy. [facility] [address].  Cal [place] if symptoms worsen.  We made you a follow up appointment with Nicoletta Ba PA for 05-10-2016 at 10:00 am.

## 2016-04-16 NOTE — Progress Notes (Signed)
Reviewed and agree with documentation and assessment and plan. K. Veena Justinian Miano , MD   

## 2016-05-10 ENCOUNTER — Ambulatory Visit: Payer: 59 | Admitting: Physician Assistant

## 2016-05-14 NOTE — L&D Delivery Note (Signed)
Delivery Note  First Stage: Labor onset: 10/23/16 @ 0520 Augmentation : AROM Analgesia Lorriane Shire intrapartum: Stadol IVP x1, Epidural AROM at 1241 - clear fluid  Second Stage: Complete dilation at 1505 Onset of pushing at 1505 FHR second stage Category 2  VBAC, spontaneous, Delivery of a viable female at 92 by Lars Pinks, CNM in direct OA, with restitution to LOA position. Dr. Pamala Hurry at bedside for delivery.  No nuchal cord Cord double clamped after cessation of pulsation, cut by FOB Cord blood sample collected   Third Stage: Placenta delivered via Delena Bali intact with 3 VC @ 1757 Placenta disposition: pathology  Uterine tone firm with massage / bleeding moderate, and slowed with fundal massage and IV Pitocin   Small left labial abrasion identified - hemostatic, not repaired Anesthesia for repair: none Repair: none Est. Blood Loss (mL): 404BV  Complications: Preterm delivery at 35+1 weeks.  NICU notified to attend delivery, but delivery occurred prior to their arrival.  Baby was vigorous, crying.   Mom to postpartum.  Baby to Couplet care / Skin to Skin.  Newborn: Birth Weight: pending Apgar Scores: 7, 9  Feeding planned: Breast and Formula  Lars Pinks, MSN, CNM  [facility]

## 2016-05-29 DIAGNOSIS — Z118 Encounter for screening for other infectious and parasitic diseases: Secondary | ICD-10-CM | POA: Diagnosis not present

## 2016-05-29 LAB — OB RESULTS CONSOLE RUBELLA ANTIBODY, IGM: Rubella: IMMUNE

## 2016-05-29 LAB — OB RESULTS CONSOLE GC/CHLAMYDIA
CHLAMYDIA, DNA PROBE: NEGATIVE
GC PROBE AMP, GENITAL: NEGATIVE

## 2016-05-29 LAB — OB RESULTS CONSOLE HIV ANTIBODY (ROUTINE TESTING): HIV: NONREACTIVE

## 2016-05-29 LAB — OB RESULTS CONSOLE HEPATITIS B SURFACE ANTIGEN: Hepatitis B Surface Ag: NEGATIVE

## 2016-05-29 LAB — OB RESULTS CONSOLE RPR: RPR: NONREACTIVE

## 2016-09-10 DIAGNOSIS — R109 Unspecified abdominal pain: Secondary | ICD-10-CM | POA: Diagnosis not present

## 2016-09-10 DIAGNOSIS — R51 Headache: Secondary | ICD-10-CM | POA: Diagnosis not present

## 2016-10-22 ENCOUNTER — Other Ambulatory Visit: Payer: Self-pay | Admitting: Obstetrics & Gynecology

## 2016-10-23 ENCOUNTER — Inpatient Hospital Stay (HOSPITAL_COMMUNITY)
Admission: AD | Admit: 2016-10-23 | Discharge: 2016-10-25 | DRG: 775 | Disposition: A | Discharge status: Home / Self Care | Payer: 59 | Source: Ambulatory Visit | Attending: Obstetrics and Gynecology | Admitting: Obstetrics and Gynecology

## 2016-10-23 ENCOUNTER — Inpatient Hospital Stay (HOSPITAL_COMMUNITY): Payer: 59 | Admitting: Anesthesiology

## 2016-10-23 ENCOUNTER — Encounter (HOSPITAL_COMMUNITY): Payer: Self-pay

## 2016-10-23 ENCOUNTER — Encounter (HOSPITAL_COMMUNITY)
Admission: AD | Disposition: A | Discharge status: Home / Self Care | Payer: Self-pay | Source: Ambulatory Visit | Attending: Obstetrics and Gynecology

## 2016-10-23 DIAGNOSIS — D649 Anemia, unspecified: Secondary | ICD-10-CM | POA: Diagnosis present

## 2016-10-23 DIAGNOSIS — O9902 Anemia complicating childbirth: Secondary | ICD-10-CM | POA: Diagnosis not present

## 2016-10-23 DIAGNOSIS — O34211 Maternal care for low transverse scar from previous cesarean delivery: Secondary | ICD-10-CM | POA: Diagnosis present

## 2016-10-23 DIAGNOSIS — O99824 Streptococcus B carrier state complicating childbirth: Secondary | ICD-10-CM | POA: Diagnosis present

## 2016-10-23 DIAGNOSIS — D72829 Elevated white blood cell count, unspecified: Secondary | ICD-10-CM | POA: Diagnosis not present

## 2016-10-23 DIAGNOSIS — O34219 Maternal care for unspecified type scar from previous cesarean delivery: Secondary | ICD-10-CM

## 2016-10-23 DIAGNOSIS — Z3A35 35 weeks gestation of pregnancy: Secondary | ICD-10-CM | POA: Diagnosis not present

## 2016-10-23 LAB — CBC
HEMATOCRIT: 36.7 % (ref 36.0–46.0)
Hemoglobin: 12.5 g/dL (ref 12.0–15.0)
MCH: 28.3 pg (ref 26.0–34.0)
MCHC: 34.1 g/dL (ref 30.0–36.0)
MCV: 83 fL (ref 78.0–100.0)
Platelets: 292 10*3/uL (ref 150–400)
RBC: 4.42 MIL/uL (ref 3.87–5.11)
RDW: 13.6 % (ref 11.5–15.5)
WBC: 13.6 10*3/uL — AB (ref 4.0–10.5)

## 2016-10-23 LAB — URINALYSIS, ROUTINE W REFLEX MICROSCOPIC
Bilirubin Urine: NEGATIVE
GLUCOSE, UA: NEGATIVE mg/dL
Hgb urine dipstick: NEGATIVE
Ketones, ur: NEGATIVE mg/dL
Nitrite: NEGATIVE
PROTEIN: NEGATIVE mg/dL
SPECIFIC GRAVITY, URINE: 1.004 — AB (ref 1.005–1.030)
pH: 7 (ref 5.0–8.0)

## 2016-10-23 LAB — TYPE AND SCREEN
ABO/RH(D): O POS
ANTIBODY SCREEN: NEGATIVE

## 2016-10-23 LAB — RPR: RPR Ser Ql: NONREACTIVE

## 2016-10-23 SURGERY — Surgical Case
Anesthesia: Spinal

## 2016-10-23 MED ORDER — BETAMETHASONE SOD PHOS & ACET 6 (3-3) MG/ML IJ SUSP
12.0000 mg | Freq: Once | INTRAMUSCULAR | Status: AC
  Administered 2016-10-23: 12 mg via INTRAMUSCULAR
  Filled 2016-10-23: qty 2

## 2016-10-23 MED ORDER — DIPHENHYDRAMINE HCL 25 MG PO CAPS: 25.0000 mg | ORAL_CAPSULE | Freq: Four times a day (QID) | ORAL | Status: DC | PRN

## 2016-10-23 MED ORDER — ACETAMINOPHEN 325 MG PO TABS: 650.0000 mg | ORAL_TABLET | ORAL | Status: DC | PRN

## 2016-10-23 MED ORDER — CEFAZOLIN SODIUM-DEXTROSE 2-4 GM/100ML-% IV SOLN: 2.0000 g | INTRAVENOUS | Status: DC

## 2016-10-23 MED ORDER — FENTANYL CITRATE (PF) 100 MCG/2ML IJ SOLN
INTRAMUSCULAR | Status: AC
  Filled 2016-10-23: qty 2

## 2016-10-23 MED ORDER — EPHEDRINE 5 MG/ML INJ
10.0000 mg | INTRAVENOUS | Status: DC | PRN
  Filled 2016-10-23: qty 2

## 2016-10-23 MED ORDER — SODIUM CHLORIDE 0.9 % IV SOLN
2.0000 g | Freq: Once | INTRAVENOUS | Status: AC
  Administered 2016-10-23: 2 g via INTRAVENOUS
  Filled 2016-10-23: qty 2000

## 2016-10-23 MED ORDER — LACTATED RINGERS IV SOLN: 500.0000 mL | INTRAVENOUS | Status: DC | PRN

## 2016-10-23 MED ORDER — ONDANSETRON HCL 4 MG/2ML IJ SOLN: 4.0000 mg | Freq: Four times a day (QID) | INTRAMUSCULAR | Status: DC | PRN

## 2016-10-23 MED ORDER — ONDANSETRON HCL 4 MG/2ML IJ SOLN
INTRAMUSCULAR | Status: AC
  Filled 2016-10-23: qty 2

## 2016-10-23 MED ORDER — OXYCODONE-ACETAMINOPHEN 5-325 MG PO TABS: 1.0000 | ORAL_TABLET | ORAL | Status: DC | PRN

## 2016-10-23 MED ORDER — PHENYLEPHRINE 40 MCG/ML (10ML) SYRINGE FOR IV PUSH (FOR BLOOD PRESSURE SUPPORT)
80.0000 ug | PREFILLED_SYRINGE | INTRAVENOUS | Status: DC | PRN
  Filled 2016-10-23: qty 5

## 2016-10-23 MED ORDER — SIMETHICONE 80 MG PO CHEW: 80.0000 mg | CHEWABLE_TABLET | ORAL | Status: DC | PRN

## 2016-10-23 MED ORDER — COCONUT OIL OIL: 1.0000 "application " | TOPICAL_OIL | Status: DC | PRN

## 2016-10-23 MED ORDER — LACTATED RINGERS IV BOLUS (SEPSIS): 1000.0000 mL | Freq: Once | INTRAVENOUS | Status: DC

## 2016-10-23 MED ORDER — ONDANSETRON HCL 4 MG/2ML IJ SOLN: 4.0000 mg | INTRAMUSCULAR | Status: DC | PRN

## 2016-10-23 MED ORDER — FAMOTIDINE IN NACL 20-0.9 MG/50ML-% IV SOLN: 20.0000 mg | Freq: Once | INTRAVENOUS | Status: DC

## 2016-10-23 MED ORDER — LIDOCAINE HCL (PF) 1 % IJ SOLN
INTRAMUSCULAR | Status: AC
  Filled 2016-10-23: qty 30

## 2016-10-23 MED ORDER — SENNOSIDES-DOCUSATE SODIUM 8.6-50 MG PO TABS
2.0000 | ORAL_TABLET | ORAL | Status: DC
  Administered 2016-10-23 – 2016-10-24 (×2): 2 via ORAL
  Filled 2016-10-23 (×2): qty 2

## 2016-10-23 MED ORDER — WITCH HAZEL-GLYCERIN EX PADS: 1.0000 "application " | MEDICATED_PAD | CUTANEOUS | Status: DC | PRN

## 2016-10-23 MED ORDER — FENTANYL 2.5 MCG/ML BUPIVACAINE 1/10 % EPIDURAL INFUSION (WH - ANES)
14.0000 mL/h | INTRAMUSCULAR | Status: DC | PRN
  Administered 2016-10-23 (×2): 14 mL/h via EPIDURAL
  Filled 2016-10-23: qty 100

## 2016-10-23 MED ORDER — SODIUM CHLORIDE 0.9 % IV SOLN
2.0000 g | Freq: Once | INTRAVENOUS | Status: AC
  Administered 2016-10-23: 2 g via INTRAVENOUS
  Filled 2016-10-23 (×2): qty 2000

## 2016-10-23 MED ORDER — OXYTOCIN BOLUS FROM INFUSION
500.0000 mL | Freq: Once | INTRAVENOUS | Status: AC
  Administered 2016-10-23: 500 mL via INTRAVENOUS

## 2016-10-23 MED ORDER — DIBUCAINE 1 % RE OINT: 1.0000 "application " | TOPICAL_OINTMENT | RECTAL | Status: DC | PRN

## 2016-10-23 MED ORDER — PRENATAL MULTIVITAMIN CH
1.0000 | ORAL_TABLET | Freq: Every day | ORAL | Status: DC
  Administered 2016-10-24 – 2016-10-25 (×2): 1 via ORAL
  Filled 2016-10-23 (×2): qty 1

## 2016-10-23 MED ORDER — LACTATED RINGERS IV SOLN
INTRAVENOUS | Status: DC
  Administered 2016-10-23 (×2): via INTRAVENOUS

## 2016-10-23 MED ORDER — OXYCODONE-ACETAMINOPHEN 5-325 MG PO TABS: 2.0000 | ORAL_TABLET | ORAL | Status: DC | PRN

## 2016-10-23 MED ORDER — DIPHENHYDRAMINE HCL 50 MG/ML IJ SOLN: 12.5000 mg | INTRAMUSCULAR | Status: DC | PRN

## 2016-10-23 MED ORDER — SOD CITRATE-CITRIC ACID 500-334 MG/5ML PO SOLN: 30.0000 mL | Freq: Once | ORAL | Status: DC

## 2016-10-23 MED ORDER — OXYTOCIN 10 UNIT/ML IJ SOLN
INTRAMUSCULAR | Status: AC
  Filled 2016-10-23: qty 4

## 2016-10-23 MED ORDER — OXYTOCIN 40 UNITS IN LACTATED RINGERS INFUSION - SIMPLE MED
INTRAVENOUS | Status: AC
  Filled 2016-10-23: qty 1000

## 2016-10-23 MED ORDER — ZOLPIDEM TARTRATE 5 MG PO TABS
5.0000 mg | ORAL_TABLET | Freq: Every evening | ORAL | Status: DC | PRN
Start: 2016-10-23 — End: 2016-10-25

## 2016-10-23 MED ORDER — BENZOCAINE-MENTHOL 20-0.5 % EX AERO
1.0000 "application " | INHALATION_SPRAY | CUTANEOUS | Status: DC | PRN
  Filled 2016-10-23: qty 56

## 2016-10-23 MED ORDER — MORPHINE SULFATE (PF) 0.5 MG/ML IJ SOLN
INTRAMUSCULAR | Status: AC
  Filled 2016-10-23: qty 10

## 2016-10-23 MED ORDER — SOD CITRATE-CITRIC ACID 500-334 MG/5ML PO SOLN: 30.0000 mL | ORAL | Status: DC | PRN

## 2016-10-23 MED ORDER — ONDANSETRON HCL 4 MG PO TABS: 4.0000 mg | ORAL_TABLET | ORAL | Status: DC | PRN

## 2016-10-23 MED ORDER — OXYTOCIN 40 UNITS IN LACTATED RINGERS INFUSION - SIMPLE MED: 2.5000 [IU]/h | INTRAVENOUS | Status: DC

## 2016-10-23 MED ORDER — IBUPROFEN 600 MG PO TABS
600.0000 mg | ORAL_TABLET | Freq: Four times a day (QID) | ORAL | Status: DC
  Administered 2016-10-23 – 2016-10-25 (×6): 600 mg via ORAL
  Filled 2016-10-23 (×7): qty 1

## 2016-10-23 MED ORDER — LIDOCAINE HCL (PF) 1 % IJ SOLN
INTRAMUSCULAR | Status: DC | PRN
  Administered 2016-10-23: 13 mL via EPIDURAL

## 2016-10-23 MED ORDER — LIDOCAINE HCL (PF) 1 % IJ SOLN
30.0000 mL | INTRAMUSCULAR | Status: DC | PRN
  Filled 2016-10-23: qty 30

## 2016-10-23 MED ORDER — LACTATED RINGERS IV SOLN: 500.0000 mL | Freq: Once | INTRAVENOUS | Status: DC

## 2016-10-23 MED ORDER — LACTATED RINGERS IV SOLN: INTRAVENOUS | Status: DC

## 2016-10-23 MED ORDER — PHENYLEPHRINE 40 MCG/ML (10ML) SYRINGE FOR IV PUSH (FOR BLOOD PRESSURE SUPPORT)
80.0000 ug | PREFILLED_SYRINGE | INTRAVENOUS | Status: DC | PRN
  Filled 2016-10-23: qty 10
  Filled 2016-10-23: qty 5

## 2016-10-23 MED ORDER — BUTORPHANOL TARTRATE 1 MG/ML IJ SOLN: 1.0000 mg | Freq: Once | INTRAMUSCULAR | Status: DC

## 2016-10-23 NOTE — MAU Note (Signed)
Pt has been having ctx since 0530, feels like they're 5 minutes apart. Previous c/s, no leaking of fluid or bleeding.

## 2016-10-23 NOTE — Anesthesia Preprocedure Evaluation (Deleted)
Anesthesia Evaluation  Patient identified by MRN, date of birth, ID band Patient awake    Reviewed: Allergy & Precautions, NPO status , Patient's Chart, lab work & pertinent test results  Airway Mallampati: II  TM Distance: >3 FB Neck ROM: Full    Dental no notable dental hx.    Pulmonary neg pulmonary ROS,    Pulmonary exam normal breath sounds clear to auscultation       Cardiovascular negative cardio ROS Normal cardiovascular exam Rhythm:Regular Rate:Normal     Neuro/Psych negative neurological ROS  negative psych ROS   GI/Hepatic negative GI ROS, Neg liver ROS,   Endo/Other  negative endocrine ROS  Renal/GU negative Renal ROS  negative genitourinary   Musculoskeletal negative musculoskeletal ROS (+)   Abdominal   Peds negative pediatric ROS (+)  Hematology negative hematology ROS (+)   Anesthesia Other Findings   Reproductive/Obstetrics negative OB ROS (+) Pregnancy                             Anesthesia Physical Anesthesia Plan  ASA: II  Anesthesia Plan: Spinal   Post-op Pain Management:    Induction:   PONV Risk Score and Plan: 3 and Ondansetron and Treatment may vary due to age or medical condition  Airway Management Planned:   Additional Equipment:   Intra-op Plan:   Post-operative Plan:   Informed Consent:   Plan Discussed with:   Anesthesia Plan Comments:         Anesthesia Quick Evaluation

## 2016-10-23 NOTE — H&P (Signed)
Subjective:  Cheryl Walters is a 26 y.o. G2 P45 female with EDC  at 11/26/16 and 35/[redacted] weeks gestation who is being admitted for active labor.  She reports painful contractions since 0530 this am, denies any vaginal bleeding or LOF.  Her current obstetrical history is significant for prior c-section (documented LTCS) for arrest of dilation.  Her antepartum course has otherwise been complicated with mild anemia.  Patient reports no other c/o today..   Fetal Movement: normal.  Initially the patinet was planning rtlcs.  I have reviewed this option along with VBAC (including risk of uterine rupture of <1%).  We have discussed risk of surgery including risk of bleeding/infection/injury to bowel, bladder, nerves, blood vessels/risk of further surgery.  Given that she is 45 wga and in active labor as well as now 7cm dilated, she is a good candidate to try for a TOLAC/VBAC.  She would like to proceed with TOLAC and would like an epidural when possible.  Consent signed.    Objective:   Vital signs in last 24 hours: Temp:  [98.3 F (36.8 C)] 98.3 F (36.8 C) (06/12 0756) Pulse Rate:  [97] 97 (06/12 0758) Resp:  [18] 18 (06/12 0756) BP: (120)/(69) 120/69 (06/12 0758) SpO2:  [94 %-100 %] 94 % (06/12 0843) Weight:  [182 lb (82.6 kg)] 182 lb (82.6 kg) (06/12 0753)   General:   alert, cooperative and uncomfortable with contractions  Skin:   normal  HEENT:  extra ocular movement intact  Lungs:   clear to auscultation bilaterally  Heart:   regular rate and rhythm, S1, S2 normal, no murmur, click, rub or gallop  Breasts:     Abdomen:  soft, nt, gravid; EFW 5 1/2 lbs  Pelvis:  7/90/+1, buldging bag, cephalic  FHT:  716 BPM  Uterine Size: size equals dates  Presentations: cephalic  Cervix:    Dilation: 7   Effacement: 90   Station:  +1   Consistency: soft   Position: anterior   Lab Review  Rh+, Rubella-immune, Hepatitis B surface antigen non-reactive, rpr neg, hiv neg; gc/cz neg; gbs unknown  AFP:NML  One hour GTT: Normal      CBC    Component Value Date/Time   WBC 13.6 (H) 10/23/2016 0837   RBC 4.42 10/23/2016 0837   HGB 12.5 10/23/2016 0837   HCT 36.7 10/23/2016 0837   PLT 292 10/23/2016 0837   MCV 83.0 10/23/2016 0837   MCH 28.3 10/23/2016 0837   MCHC 34.1 10/23/2016 0837   RDW 13.6 10/23/2016 0837   LYMPHSABS 3.0 04/12/2016 1047   MONOABS 0.8 04/12/2016 1047   EOSABS 0.1 04/12/2016 1047   BASOSABS 0.1 04/12/2016 1047   FHT: 150s, +accels, no decels, normal variability, occ. Variable; category 1 Toco: irreg q 1-3   Assessment/Plan:   IUP at 35 1/7 1. Active labor - admit for delivery; pt would like TOLAC and will plan vbac; consent signed 2. gbs unknown - begin ampicillin 3. Rh pos 4. Preterm - pt did receive betamethasone x1 on admission; NICU aware of patient 5. Rubella immune    Risks, benefits, alternatives and possible complications have been discussed in detail with the patient.   All questions answered, all appropriate consents have been signed.

## 2016-10-23 NOTE — Anesthesia Preprocedure Evaluation (Signed)
Anesthesia Evaluation  Patient identified by MRN, date of birth, ID band Patient awake    Reviewed: Allergy & Precautions, NPO status , Patient's Chart, lab work & pertinent test results  Airway Mallampati: II  TM Distance: >3 FB Neck ROM: Full    Dental no notable dental hx.    Pulmonary neg pulmonary ROS,    Pulmonary exam normal breath sounds clear to auscultation       Cardiovascular negative cardio ROS Normal cardiovascular exam Rhythm:Regular Rate:Normal     Neuro/Psych negative neurological ROS  negative psych ROS   GI/Hepatic negative GI ROS, Neg liver ROS,   Endo/Other  negative endocrine ROS  Renal/GU negative Renal ROS  negative genitourinary   Musculoskeletal negative musculoskeletal ROS (+)   Abdominal   Peds negative pediatric ROS (+)  Hematology negative hematology ROS (+)   Anesthesia Other Findings   Reproductive/Obstetrics negative OB ROS (+) Pregnancy                             Anesthesia Physical  Anesthesia Plan  ASA: II  Anesthesia Plan: Epidural   Post-op Pain Management:    Induction:   PONV Risk Score and Plan: Treatment may vary due to age or medical condition  Airway Management Planned:   Additional Equipment:   Intra-op Plan:   Post-operative Plan:   Informed Consent:   Plan Discussed with:   Anesthesia Plan Comments:         Anesthesia Quick Evaluation

## 2016-10-23 NOTE — Progress Notes (Signed)
Comfortable with epidural; does feel pressure in her bottom  BP 114/65   Pulse 85   Temp 97.9 F (36.6 C) (Oral)   Resp 16   Ht 5' (1.524 m)   Wt 182 lb (82.6 kg)   SpO2 100%   BMI 35.54 kg/m   A&ox3 nml respirations Abd: soft, nt, gravid 8/341/9, cephalic and bulging bag; arom with clear fluid  FHT: category 1 Toco: q 1-3 min  A/p: iup at 35 1 1. Tolac, plan for vbac; contin rotating patient position 2. Fetal status reassuring 3. gbs unknown - ampicillin 4. Rh pos 5. Prematurity - betamethasone x1

## 2016-10-23 NOTE — Anesthesia Procedure Notes (Signed)
Epidural Patient location during procedure: OB Start time: 10/23/2016 10:01 AM End time: 10/23/2016 10:15 AM  Staffing Anesthesiologist: Candida Peeling RAY Performed: anesthesiologist   Preanesthetic Checklist Completed: patient identified, site marked, surgical consent, pre-op evaluation, timeout performed, IV checked, risks and benefits discussed and monitors and equipment checked  Epidural Patient position: sitting Prep: DuraPrep Patient monitoring: heart rate, cardiac monitor, continuous pulse ox and blood pressure Approach: midline Location: L2-L3 Injection technique: LOR saline  Needle:  Needle type: Tuohy  Needle gauge: 17 G Needle length: 9 cm Needle insertion depth: 7 cm Catheter type: closed end flexible Catheter size: 20 Guage Catheter at skin depth: 10 cm Test dose: negative  Assessment Events: blood not aspirated, injection not painful, no injection resistance, negative IV test and no paresthesia  Additional Notes Reason for block:procedure for pain

## 2016-10-23 NOTE — Progress Notes (Signed)
S:  Notified by RN pt. Was feeling pressure, but unchanged cervical exam   O:  VS: Blood pressure 117/72, pulse (!) 101, temperature 98 F (36.7 C), temperature source Oral, resp. rate 18, height 5' (1.524 m), weight 82.6 kg (182 lb), SpO2 100 %.        FHR : baseline 130 bpm / variability moderate / accelerations + / no decelerations        Toco: contractions every 2-4 minutes / moderate        Cervix : 9.5cm/100%/0 station        Membranes: clear fluid with bloody show  A: Protracted active labor     FHR category 2  P: Continue expectant management      Dr. Pamala Hurry updated      Cautious for VBAC  Lars Pinks, CNM

## 2016-10-23 NOTE — Progress Notes (Signed)
Pt now with epidural, comfortable  Temp:  [97.7 F (36.5 C)-98.3 F (36.8 C)] 97.7 F (36.5 C) (06/12 0949) Pulse Rate:  [0-97] 91 (06/12 1046) Resp:  [17-20] 17 (06/12 1046) BP: (109-132)/(39-74) 109/67 (06/12 1046) SpO2:  [94 %-100 %] 100 % (06/12 1036) Weight:  [182 lb (82.6 kg)] 182 lb (82.6 kg) (06/12 0949)    A&ox3 nml respirations Abd: soft, nt, gravid Cx: 9/100/0 - +1 (with ctx); cephalic, bulging bag LE: no edema, nt bilat LE   FHT: category 1; baseline 140s, +accels, no decels, normal variability TOCO: irreg q 2  A/P: iup at 35 1 1. Active labor, TOLAC - plan svd 2. gbs unknown - abx were ordered stat and not yet started; nursing just called pharmacy to check on status; no arom now 3. Prematurity - s/p betamethasone x1 4. Fetal status reassuring - contin efm

## 2016-10-23 NOTE — MAU Provider Note (Signed)
Chief Complaint:  Contractions   None     HPI: Cheryl Walters is a 26 y.o. G2P1001 at 47w1dwho presents to maternity admissions reporting onset of contractions at 5:30 am this morning, gradually worsening over time. She is breathing with contractions in MAU.  There are no associated symptoms. She has not tried any treatments. She has hx of a C/S with her previous pregnancy and has planned a repeat C/S.   She reports good fetal movement, denies LOF, vaginal bleeding, vaginal itching/burning, urinary symptoms, h/a, dizziness, n/v, or fever/chills.    HPI  Past Medical History: Past Medical History:  Diagnosis Date  . Acne   . Headache(784.0)   . Irregular periods   . Pilonidal cyst     Past obstetric history: OB History  Gravida Para Term Preterm AB Living  2 1 1     1   SAB TAB Ectopic Multiple Live Births               # Outcome Date GA Lbr Len/2nd Weight Sex Delivery Anes PTL Lv  2 Current           1 Term      CS-LTranv         Past Surgical History: Past Surgical History:  Procedure Laterality Date  . BIRTH CONTROL IMPLANT    . CESAREAN SECTION      Family History: Family History  Problem Relation Age of Onset  . Other Mother        irreg periods  . Other Sister        irreg periods  . Sleep apnea Father        on machine  . Breast cancer Maternal Aunt   . Brain cancer Paternal Aunt   . Colon cancer Neg Hx   . Throat cancer Neg Hx   . Kidney disease Neg Hx   . Liver disease Neg Hx   . Diabetes Neg Hx   . Heart disease Neg Hx     Social History: Social History  Substance Use Topics  . Smoking status: Never Smoker  . Smokeless tobacco: Never Used  . Alcohol use 0.0 oz/week     Comment: 3 every other week    Allergies: No Known Allergies  Meds:  Prescriptions Prior to Admission  Medication Sig Dispense Refill Last Dose  . acetaminophen (TYLENOL) 500 MG tablet Take 1,000 mg by mouth every 6 (six) hours as needed for moderate pain.   Past Week  at Unknown time  . IRON PO Take 1 tablet by mouth daily.   10/22/2016 at Unknown time  . Prenatal Vit-Fe Fumarate-FA (PRENATAL MULTIVITAMIN) TABS tablet Take 1 tablet by mouth daily at 12 noon.   10/22/2016 at Unknown time  . mesalamine (CANASA) 1000 MG suppository Place 1 suppository (1,000 mg total) rectally at bedtime. (Patient not taking: Reported on 10/23/2016) 30 suppository 1 Not Taking at Unknown time    ROS:  Review of Systems  Constitutional: Negative for chills, fatigue and fever.  Eyes: Negative for visual disturbance.  Respiratory: Negative for shortness of breath.   Cardiovascular: Negative for chest pain.  Gastrointestinal: Positive for abdominal pain. Negative for nausea and vomiting.  Genitourinary: Positive for pelvic pain. Negative for difficulty urinating, dysuria, flank pain, vaginal bleeding, vaginal discharge and vaginal pain.  Neurological: Negative for dizziness and headaches.  Psychiatric/Behavioral: Negative.      I have reviewed patient's Past Medical Hx, Surgical Hx, Family Hx, Social Hx, medications and allergies.  Physical Exam   Patient Vitals for the past 24 hrs:  BP Temp Temp src Pulse Resp SpO2 Weight  10/23/16 0843 - - - - - 94 % -  10/23/16 0842 - - - - - 97 % -  10/23/16 0837 - - - - - 96 % -  10/23/16 0831 - - - - - 96 % -  10/23/16 0827 - - - - - 100 % -  10/23/16 0826 - - - - - 99 % -  10/23/16 0825 - - - - - 99 % -  10/23/16 0821 - - - - - 96 % -  10/23/16 0816 - - - - - 98 % -  10/23/16 0815 - - - - - 98 % -  10/23/16 0811 - - - - - 99 % -  10/23/16 0806 - - - - - 98 % -  10/23/16 0801 - - - - - 97 % -  10/23/16 0758 120/69 - - 97 - - -  10/23/16 0756 - 98.3 F (36.8 C) Oral - 18 98 % -  10/23/16 0753 - - - - - - 182 lb (82.6 kg)   Constitutional: Well-developed, well-nourished female in no acute distress.  Cardiovascular: normal rate Respiratory: normal effort GI: Abd soft, non-tender, gravid appropriate for gestational age.   MS: Extremities nontender, no edema, normal ROM Neurologic: Alert and oriented x 4.  GU: Neg CVAT.  Dilation:6 Effacement (%): 90 Station: -2 Presentation: Vertex Exam by:: Leftwich-Kirby, CNM  FHT:  Baseline 135 , moderate variability, accelerations present, no decelerations Contractions: q 3 mins   Labs: Results for orders placed or performed during the hospital encounter of 10/23/16 (from the past 24 hour(s))  Urinalysis, Routine w reflex microscopic     Status: Abnormal   Collection Time: 10/23/16  7:45 AM  Result Value Ref Range   Color, Urine YELLOW YELLOW   APPearance CLEAR CLEAR   Specific Gravity, Urine 1.004 (L) 1.005 - 1.030   pH 7.0 5.0 - 8.0   Glucose, UA NEGATIVE NEGATIVE mg/dL   Hgb urine dipstick NEGATIVE NEGATIVE   Bilirubin Urine NEGATIVE NEGATIVE   Ketones, ur NEGATIVE NEGATIVE mg/dL   Protein, ur NEGATIVE NEGATIVE mg/dL   Nitrite NEGATIVE NEGATIVE   Leukocytes, UA LARGE (A) NEGATIVE   RBC / HPF 0-5 0 - 5 RBC/hpf   WBC, UA 6-30 0 - 5 WBC/hpf   Bacteria, UA RARE (A) NONE SEEN   Squamous Epithelial / LPF 6-30 (A) NONE SEEN   Mucous PRESENT   CBC     Status: Abnormal   Collection Time: 10/23/16  8:37 AM  Result Value Ref Range   WBC 13.6 (H) 4.0 - 10.5 K/uL   RBC 4.42 3.87 - 5.11 MIL/uL   Hemoglobin 12.5 12.0 - 15.0 g/dL   HCT 36.7 36.0 - 46.0 %   MCV 83.0 78.0 - 100.0 fL   MCH 28.3 26.0 - 34.0 pg   MCHC 34.1 30.0 - 36.0 g/dL   RDW 13.6 11.5 - 15.5 %   Platelets 292 150 - 400 K/uL  Type and screen [place]     Status: None (Preliminary result)   Collection Time: 10/23/16  8:37 AM  Result Value Ref Range   ABO/RH(D) O POS    Antibody Screen PENDING    Sample Expiration 10/26/2016    --/--/O POS (06/12 6433)  Imaging:  No results found.  MAU Course/MDM: Cervix with advanced dilation NST reviewed and reactive Consult  Dr Murrell Redden with presentation, exam findings and test results.  Prepare for OR Dr Murrell Redden to bedside,  pt cervix rechecked and now 7 cm TOLAC risks/benefits reviewed and pt consents to [facility] Pt ready for admission to [facility] by RN staff BMZ x 1 dose per protocol for preterm labor less than 37 weeks Ampicillin 2 g IV for GBS unknown Pt stable at time of transfer   Assessment: 1. Labor and delivery, indication for care   2. History of cesarean delivery affecting pregnancy   3. GBS unknown  Plan: Admit to [facility] May have epidural Ampicillin now and Q 6 hours for GBS prophylaxis/GBS unknown preterm Anticipate VBAC   Fatima Blank Certified Nurse-Midwife 10/23/2016 9:47 AM

## 2016-10-23 NOTE — Anesthesia Pain Management Evaluation Note (Signed)
  CRNA Pain Management Visit Note  Patient: Cheryl Walters, 26 y.o., female  "Hello I am a member of the anesthesia team at [facility]. We have an anesthesia team available at all times to provide care throughout the hospital, including epidural management and anesthesia for C-section. I don't know your plan for the delivery whether it a natural birth, water birth, IV sedation, nitrous supplementation, doula or epidural, but we want to meet your pain goals."   1.Was your pain managed to your expectations on prior hospitalizations?   Yes   2.What is your expectation for pain management during this hospitalization?     Epidural  3.How can we help you reach that goal? Epidural in place.  Record the patient's initial score and the patient's pain goal.   Pain: 0  Pain Goal: 8 prior to epidural placement. The [facility] wants you to be able to say your pain was always managed very well.  Rosenda Geffrard L 10/23/2016

## 2016-10-23 NOTE — Progress Notes (Signed)
S:  Pt. Feeling more pressure, but still comfortable with epidural   O:  VS: Blood pressure (!) 113/57, pulse (!) 105, temperature 98 F (36.7 C), temperature source Oral, resp. rate 18, height 5' (1.524 m), weight 82.6 kg (182 lb), SpO2 100 %, unknown if currently breastfeeding.        FHR : baseline 145 bpm / variability minimal to moderate / accelerations + / occasional variable decelerations        Toco: contractions every 2-4 minutes / strong        Cervix : Dilation: 10 Dilation Complete Date: 10/23/16 Dilation Complete Time: 1505 Effacement (%): 100 Station: +1 Presentation: Vertex Exam by:: Meridith Verlinda Slotnick CNM        Membranes: clear fluid with bloody show  A: Active labor     FHR category 2  P: Begin pushing      Anticipate VBAC    Dr. Pamala Hurry updated   Lars Pinks, CNM

## 2016-10-24 LAB — CBC
HEMATOCRIT: 32.6 % — AB (ref 36.0–46.0)
HEMOGLOBIN: 11 g/dL — AB (ref 12.0–15.0)
MCH: 28 pg (ref 26.0–34.0)
MCHC: 33.7 g/dL (ref 30.0–36.0)
MCV: 83 fL (ref 78.0–100.0)
Platelets: 284 10*3/uL (ref 150–400)
RBC: 3.93 MIL/uL (ref 3.87–5.11)
RDW: 13.7 % (ref 11.5–15.5)
WBC: 21.1 10*3/uL — ABNORMAL HIGH (ref 4.0–10.5)

## 2016-10-24 MED ORDER — OXYCODONE-ACETAMINOPHEN 5-325 MG PO TABS
1.0000 | ORAL_TABLET | ORAL | Status: DC | PRN
  Filled 2016-10-24: qty 1

## 2016-10-24 NOTE — Anesthesia Postprocedure Evaluation (Signed)
Anesthesia Post Note  Patient: Cheryl Walters  Procedure(s) Performed: * No procedures listed *     Patient location during evaluation: Mother Baby Anesthesia Type: Epidural Level of consciousness: awake Pain management: pain level controlled Vital Signs Assessment: post-procedure vital signs reviewed and stable Respiratory status: spontaneous breathing Cardiovascular status: stable Postop Assessment: no headache, no backache, epidural receding, patient able to bend at knees, no signs of nausea or vomiting and adequate PO intake Anesthetic complications: no    Last Vitals:  Vitals:   10/24/16 0100 10/24/16 0620  BP: (!) 126/55 102/62  Pulse: 82 80  Resp: 18 18  Temp: 36.9 C 37 C    Last Pain:  Vitals:   10/24/16 0735  TempSrc:   PainSc: 0-No pain   Pain Goal: Patients Stated Pain Goal: 2 (10/23/16 2228)               Barkley Boards

## 2016-10-24 NOTE — Lactation Note (Signed)
This note was copied from a baby's chart. Lactation Consultation Note  Patient Name: Cheryl Walters AYTKZ'S Date: 10/24/2016 Reason for consult: Follow-up assessment;Infant < 6lbs;Late preterm infant Mom had baby latched when [place] arrived. Baby demonstrates some good suckling bursts off/on. No audible swallows, few swallowing motions noted. Baby sleepy at the end of the feeding. Attempted/demonstrated supplement via cup, baby took small amount and spit up small amount. Attempted/demonstrated supplementing by finger feeding with curved tipped syringe. Took a while for baby to develop suckling pattern, weak suck but was able to take 2-3 ml of colostrum. Total supplement between cup and finger feeding approx 5 ml but baby did spit up small amount as well. Mom to keep baby upright for 20-30 minutes after feedings. Mom to pump every 3 hours for 15 minutes to encourage milk production and to have EBM to supplement. Feeding plan: Continue to BF with feeding ques, but wake baby as needed. Baby needs to BF/supplement at least every 3 hours. If baby sleepy, supplement by finger feeding via curved tipped syringe. If awake and suckling use foley cup or spoon. Supplement via LPT guidelines, at least 5 ml today. Post pump for 15 minutes every 3 hours. Call for assist as needed.   Maternal Data    Feeding Feeding Type: Formula Nipple Type: Slow - flow Length of feed: 15 min  LATCH Score/Interventions Latch: Repeated attempts needed to sustain latch, nipple held in mouth throughout feeding, stimulation needed to elicit sucking reflex. Intervention(s): Breast compression;Adjust position  Audible Swallowing: A few with stimulation Intervention(s): Alternate breast massage  Type of Nipple: Everted at rest and after stimulation  Comfort (Breast/Nipple): Soft / non-tender     Hold (Positioning): No assistance needed to correctly position infant at breast. Intervention(s): Support  Pillows;Breastfeeding basics reviewed  LATCH Score: 8  Lactation Tools Discussed/Used     Consult Status Consult Status: Follow-up Date: 10/24/16 Follow-up type: In-patient    Katrine Coho 10/24/2016, 2:22 PM

## 2016-10-24 NOTE — Progress Notes (Addendum)
PPD #1, VBAC, Baby Boy  S:  Reports feeling okay, having some intense cramping at times that is not relieved by Motrin or Tylenol and requesting stronger pain medication.  Desires circumcision by Dr. Benjie Karvonen.              Tolerating po/ No nausea or vomiting             Bleeding is light             Up ad lib / ambulatory / voiding QS  Newborn breast and formula feeding - baby has poor suck right now, so she has been intermittently feeding and spoon feeding / Circumcision - planning with Dr. Allen Derry planning to stay 48-72 hrs  O:               VS: BP 102/62 (BP Location: Right Arm)   Pulse 80   Temp 98.6 F (37 C) (Oral)   Resp 18   Ht 5' (1.524 m)   Wt 82.6 kg (182 lb)   SpO2 100%   Breastfeeding? Unknown   BMI 35.54 kg/m    LABS:              Recent Labs  10/23/16 0837 10/24/16 0557  WBC 13.6* 21.1*  HGB 12.5 11.0*  PLT 292 284               Blood type: --/--/O POS (06/12 0837)  Rubella: Immune (01/16 0000)                     I&O: Intake/Output      06/12 0701 - 06/13 0700 06/13 0701 - 06/14 0700   Urine (mL/kg/hr) 1050    Blood 300    Total Output 1350     Net -1350                        Physical Exam:             Alert and oriented X3  Lungs: Clear and unlabored  Heart: regular rate and rhythm / no mumurs  Abdomen: soft, non-tender, non-distended              Fundus: firm, non-tender, U-E  Perineum: intact, slight edema, no significant erythema   Lochia: appropriate  Extremities: +1 BLE edema, no calf pain or tenderness    A: PPD # 1, VBAC  S/p Preterm delivery  GBS Positive - tx with 2 doses of Ampicillin   Leukocytosis - but no s/s of infection  Doing well - stable status  P: Routine post partum orders  Percocet 5-331m every 4hrs PRN for severe pain   See lactation today   Circumcision when cleared by Peds  May D/C IV today  Continue current care  MLars Pinks MSN, CNM [facility]

## 2016-10-24 NOTE — Plan of Care (Signed)
Problem: Nutritional: Goal: Mothers verbalization of comfort with breastfeeding process will improve Outcome: Progressing Set up DEBP per LPTI protocol; baby won't latch

## 2016-10-24 NOTE — Lactation Note (Signed)
This note was copied from a baby's chart. Lactation Consultation Note Baby at 22 hrs old, BF for 15 min. Right after delivery. Mom pumped after admitting to rm. Pumped 12 ml. RN gave baby 10 ml spoon feeding. Since they tried to give more colostrum, baby to sleepy, didn't want to suck on finger. Baby sleepy. FOB holding baby after bath doing STS. Mom pumping at this time.  LPI information sheet given and reviewed w/mom information on feeding and supplementing. Encouraged to give colostrum first and not to mix w/formula. Praised mom for being prompt to pump to give colostrum. Educated STS, I&O, supply and demand.  Mom encouraged to feed baby 8-12 times/24 hours and with feeding cues. Mom encouraged to waken baby for feeds. Stressed importance of feeding every 2-3 hours, spoon feed if sleepy to stimulate to BF. Mom done teach back of feeding plan. [place] brochure given w/resources, support groups and [place] services. Patient Name: Cheryl Walters ESLPN'P Date: 10/24/2016 Reason for consult: Initial assessment   Maternal Data Has patient been taught Hand Expression?: Yes Does the patient have breastfeeding experience prior to this delivery?: No  Feeding    LATCH Score/Interventions       Type of Nipple: Everted at rest and after stimulation  Comfort (Breast/Nipple): Soft / non-tender     Intervention(s): Breastfeeding basics reviewed;Skin to skin     Lactation Tools Discussed/Used Tools: Pump Breast pump type: Double-Electric Breast Pump   Consult Status Consult Status: Follow-up Date: 10/25/16 Follow-up type: In-patient    Sincerity Cedar, Elta [place] 10/24/2016, 6:00 AM

## 2016-10-25 MED ORDER — COCONUT OIL OIL: 1.0000 "application " | TOPICAL_OIL | 0 refills | Status: DC | PRN

## 2016-10-25 MED ORDER — BENZOCAINE-MENTHOL 20-0.5 % EX AERO: 1.0000 "application " | INHALATION_SPRAY | CUTANEOUS | Status: DC | PRN

## 2016-10-25 MED ORDER — IBUPROFEN 600 MG PO TABS: 600.0000 mg | ORAL_TABLET | Freq: Four times a day (QID) | ORAL | 0 refills | Status: DC

## 2016-10-25 NOTE — Lactation Note (Signed)
This note was copied from a baby's chart. Lactation Consultation Note  Patient Name: Cheryl Walters DVVOH'Y Date: 10/25/2016 Reason for consult: Follow-up assessment   Baby [redacted]w[redacted]D , < 5 lbs. This morning mother pumped 37 ml. Is giving baby supplement with slow flow nipple after breastfeeding. Mother's breasts are filling.  She is planning on pumping again.  Encouraged breast massage during pumping and feeding to empty breast. Baby latched with NNS.  Suggest she allow baby to rest. Reminded mother to bf first and supplement after each feeding.    Maternal Data    Feeding Feeding Type: Breast Fed Length of feed: 10 min  LATCH Score/Interventions Latch: Grasps breast easily, tongue down, lips flanged, rhythmical sucking. Intervention(s): Adjust position;Assist with latch;Breast massage;Breast compression  Audible Swallowing: A few with stimulation Intervention(s): Skin to skin;Hand expression  Type of Nipple: Everted at rest and after stimulation  Comfort (Breast/Nipple): Filling, red/small blisters or bruises, mild/mod discomfort  Problem noted: Mild/Moderate discomfort (sore milk coming in ) Interventions (Mild/moderate discomfort): Hand massage;Hand expression  Hold (Positioning): Assistance needed to correctly position infant at breast and maintain latch. Intervention(s): Skin to skin;Position options;Support Pillows;Breastfeeding basics reviewed  LATCH Score: 7  Lactation Tools Discussed/Used     Consult Status Consult Status: Follow-up Date: 10/26/16 Follow-up type: In-patient    Vivianne Master [employer] 10/25/2016, 10:15 AM

## 2016-10-25 NOTE — Discharge Summary (Signed)
Obstetric Discharge Summary Reason for Admission: onset of labor and preterm labor, TOLAC Prenatal Procedures: ultrasound Intrapartum Procedures: spontaneous vaginal delivery and epidural Postpartum Procedures: none Complications-Operative and Postpartum: none Hemoglobin  Date Value Ref Range Status  10/24/2016 11.0 (L) 12.0 - 15.0 g/dL Final   HCT  Date Value Ref Range Status  10/24/2016 32.6 (L) 36.0 - 46.0 % Final    Physical Exam:  General: alert, cooperative and no distress Lochia: appropriate Uterine Fundus: firm Incision: NA DVT Evaluation: No cords or calf tenderness. No significant calf/ankle edema.  Discharge Diagnoses: Preterm Pregnancy-delivered 35 weeks  Discharge Information: Date: 10/25/2016 Activity: pelvic rest Diet: routine Medications:  Allergies as of 10/25/2016   No Known Allergies     Medication List    TAKE these medications   acetaminophen 500 MG tablet Commonly known as:  TYLENOL Take 1,000 mg by mouth every 6 (six) hours as needed for moderate pain.   benzocaine-Menthol 20-0.5 % Aero Commonly known as:  DERMOPLAST Apply 1 application topically as needed for irritation (perineal discomfort).   coconut oil Oil Apply 1 application topically as needed.   ibuprofen 600 MG tablet Commonly known as:  ADVIL,MOTRIN Take 1 tablet (600 mg total) by mouth every 6 (six) hours.   IRON PO Take 1 tablet by mouth daily.   mesalamine 1000 MG suppository Commonly known as:  CANASA Place 1 suppository (1,000 mg total) rectally at bedtime.   prenatal multivitamin Tabs tablet Take 1 tablet by mouth daily at 12 noon.      Condition: stable Instructions: refer to practice specific booklet Discharge to: boarding status Follow-up Information    Azucena Fallen, MD. Schedule an appointment as soon as possible for a visit in 6 week(s).   Specialty:  Obstetrics and Gynecology Contact information: [place] [address] [phone]            Newborn Data: Live born female [place] Birth Weight: 5 lb 2.9 oz (2350 g) APGAR: 7, 9  Remains inpatient for feedings.  Juliene Pina, CNM 10/25/2016, 11:20 AM

## 2016-10-26 ENCOUNTER — Ambulatory Visit: Payer: Self-pay

## 2016-10-26 NOTE — Lactation Note (Addendum)
This note was copied from a baby's chart. Lactation Consultation Note [name] is now 41 hrs old. BF much better, then taking supplement or just taking supplement occasionally so mom can rest FOB will feed baby. Mom's pumps every 4 hrs. Breast filling, has knots. Mom massaging as she pumps one breast at a time. Discussed supply and demand, and may having to pump every 3 hrs, depending on breast fullness after BF. Encouraged not to let BM sit in breast, could cause decrease in production, or infections. Mom pumped total of 44m this last pump. Mom is now having to refrigerate milk. Discussed milk storage. Stressed importance of not letting BM go bad. Still has good colostrum color. Discussed how much baby can tolerate, should divide 70 ml to 2 feedings if baby BF prior to supplementing.  Discussed engorgement, prevention, and management. Mom has a Medela pump at home. Discussed may have to pre-pump some if breast are to full that the areola and nipple are compressible enough for baby to BF.  Mom stated understanding. Praised mom for great milk production. Stressed how good it is to be pumping that amount.  Patient Name: Cheryl JTrinitey RoacheTVTXLE'ZDate: 10/26/2016 Reason for consult: Follow-up assessment;Infant < 6lbs;Late preterm infant   Maternal Data    Feeding    LATCH Score/Interventions       Type of Nipple: Everted at rest and after stimulation  Comfort (Breast/Nipple): Filling, red/small blisters or bruises, mild/mod discomfort  Problem noted: Filling;Mild/Moderate discomfort Interventions (Filling): Massage;Firm support;Frequent nursing;Double electric pump  Hold (Positioning): No assistance needed to correctly position infant at breast. Intervention(s): Breastfeeding basics reviewed;Support Pillows;Position options;Skin to skin     Lactation Tools Discussed/Used Tools: Pump Breast pump type: Double-Electric Breast Pump   Consult Status Consult Status: Follow-up Date:  10/26/16 (in pm) Follow-up type: In-patient    CTheodoro Kalata6/15/2018, 3:47 AM

## 2016-10-26 NOTE — Lactation Note (Signed)
This note was copied from a baby's chart. Lactation Consultation Note  Patient Name: Cheryl Walters'C Date: 10/26/2016 Reason for consult: Follow-up assessment Baby at 78 hr of life and dyad set to go home today. Baby latches easily and comfortably with lots of swallows. Mom denies breast or nipple pain but stated that her "breasts are getting full". She is bf on demand q3hr and post pumping. She will pump between feeding if she feels like her breast are filling. She uses the Hands on Pumping method and does breast massage while feeding. After each bf she offers expressed milk per volume guidelines. She has a Medela DEBP at home and knows how to use it. She desires to continue offering bottles of expressed milk. She made a lactation OP apt for 11/09/16. She is aware of lactation services and support group. She will call as needed.  Mom will offer the breast on demand q3hr, post pump, and supplement with expressed milk.   Maternal Data    Feeding Feeding Type: Breast Fed Length of feed: 15 min  LATCH Score/Interventions Latch: Grasps breast easily, tongue down, lips flanged, rhythmical sucking. Intervention(s): Breast compression  Audible Swallowing: Spontaneous and intermittent Intervention(s): Hand expression  Type of Nipple: Everted at rest and after stimulation  Comfort (Breast/Nipple): Filling, red/small blisters or bruises, mild/mod discomfort  Problem noted: Filling Interventions (Filling): Frequent nursing;Double electric pump Interventions (Mild/moderate discomfort): Hand massage  Hold (Positioning): No assistance needed to correctly position infant at breast. Intervention(s): Position options  LATCH Score: 9  Lactation Tools Discussed/Used Pump Review: Setup, frequency, and cleaning;Milk Storage   Consult Status Consult Status: Complete Follow-up type: Call as needed    Denzil Hughes 10/26/2016, 10:41 AM

## 2016-10-27 ENCOUNTER — Ambulatory Visit: Payer: Self-pay

## 2016-10-27 NOTE — Lactation Note (Signed)
This note was copied from a baby's chart. Lactation Consultation Note  Patient Name: Boy Sendy Pluta [id number] Date: 10/27/2016 Reason for consult: Follow-up assessment   With this mom of a LPI, now 31 hours old, and 35 6/7 weeks CGA, Mom states he is breast feeding well, and she is pumping up to 90 ml's at a time, and supplementing the baby with as much as 30 ml's EBm. I encouraged mom to keep pumping at least every 3 hours, until her o/p appointment in 2 weeks. If baby is still gaining and she can wean on pumping, this can be reviewed at the consult. Breast milk storage reviewed with mom, as well as breast care/engorgemetn care. Mom knows to call for questions/conerns.    Maternal Data    Feeding Feeding Type: Bottle Fed - Breast Milk Nipple Type: Slow - flow  LATCH Score/Interventions                      Lactation Tools Discussed/Used     Consult Status Consult Status: Complete Follow-up type: Call as needed    Tonna Corner 10/27/2016, 8:59 AM

## 2016-11-22 ENCOUNTER — Inpatient Hospital Stay (HOSPITAL_COMMUNITY): Admit: 2016-11-22 | Payer: 59 | Admitting: Obstetrics & Gynecology

## 2017-05-31 ENCOUNTER — Encounter: Payer: Self-pay | Admitting: Family Medicine

## 2017-05-31 ENCOUNTER — Ambulatory Visit (INDEPENDENT_AMBULATORY_CARE_PROVIDER_SITE_OTHER): Payer: 59 | Admitting: Family Medicine

## 2017-05-31 VITALS — BP 120/80 | HR 68 | Temp 98.0°F | Wt 184.7 lb

## 2017-05-31 DIAGNOSIS — L732 Hidradenitis suppurativa: Secondary | ICD-10-CM | POA: Diagnosis not present

## 2017-05-31 MED ORDER — SULFAMETHOXAZOLE-TRIMETHOPRIM 800-160 MG PO TABS: 1.0000 | ORAL_TABLET | Freq: Two times a day (BID) | ORAL | 0 refills | Status: AC

## 2017-05-31 NOTE — Patient Instructions (Addendum)
Hidradenitis Suppurativa Hidradenitis suppurativa is a long-term (chronic) skin disease that starts with blocked sweat glands or hair follicles. Bacteria may grow in these blocked openings of your skin. Hidradenitis suppurativa is like a severe form of acne that develops in areas of your body where acne would be unusual. It is most likely to affect the areas of your body where skin rubs against skin and becomes moist. This includes your:  Underarms.  Groin.  Genital areas.  Buttocks.  Upper thighs.  Breasts.  Hidradenitis suppurativa may start out with small pimples. The pimples can develop into deep sores that break open (rupture) and drain pus. Over time your skin may thicken and become scarred. Hidradenitis suppurativa cannot be passed from person to person. What are the causes? The exact cause of hidradenitis suppurativa is not known. This condition may be due to:  Female and female hormones. The condition is rare before and after puberty.  An overactive body defense system (immune system). Your immune system may overreact to the blocked hair follicles or sweat glands and cause swelling and pus-filled sores.  What increases the risk? You may have a higher risk of hidradenitis suppurativa if you:  Are a woman.  Are between ages 76 and 69.  Have a family history of hidradenitis suppurativa.  Have a personal history of acne.  Are overweight.  Smoke.  Take the drug lithium.  What are the signs or symptoms? The first signs of an outbreak are usually painful skin bumps that look like pimples. As the condition progresses:  Skin bumps may get bigger and grow deeper into the skin.  Bumps under the skin may rupture and drain smelly pus.  Skin may become itchy and infected.  Skin may thicken and scar.  Drainage may continue through tunnels under the skin (fistulas).  Walking and moving your arms can become painful.  How is this diagnosed? Your health care provider may  diagnose hidradenitis suppurativa based on your medical history and your signs and symptoms. A physical exam will also be done. You may need to see a health care provider who specializes in skin diseases (dermatologist). You may also have tests done to confirm the diagnosis. These can include:  Swabbing a sample of pus or drainage from your skin so it can be sent to the lab and tested for infection.  Blood tests to check for infection.  How is this treated? The same treatment will not work for everybody with hidradenitis suppurativa. Your treatment will depend on how severe your symptoms are. You may need to try several treatments to find what works best for you. Part of your treatment may include cleaning and bandaging (dressing) your wounds. You may also have to take medicines, such as the following:  Antibiotics.  Acne medicines.  Medicines to block or suppress the immune system.  A diabetes medicine (metformin) is sometimes used to treat this condition.  For women, birth control pills can sometimes help relieve symptoms.  You may need surgery if you have a severe case of hidradenitis suppurativa that does not respond to medicine. Surgery may involve:  Using a laser to clear the skin and remove hair follicles.  Opening and draining deep sores.  Removing the areas of skin that are diseased and scarred.  Follow these instructions at home:  Learn as much as you can about your disease, and work closely with your health care providers.  Take medicines only as directed by your health care provider.  If you were prescribed  an antibiotic medicine, finish it all even if you start to feel better.  If you are overweight, losing weight may be very helpful. Try to reach and maintain a healthy weight.  Do not use any tobacco products, including cigarettes, chewing tobacco, or electronic cigarettes. If you need help quitting, ask your health care provider.  Do not shave the areas where you  get hidradenitis suppurativa.  Do not wear deodorant.  Wear loose-fitting clothes.  Try not to overheat and get sweaty.  Take a daily bleach bath as directed by your health care provider. ? Fill your bathtub halfway with water. ? Pour in  cup of unscented household bleach. ? Soak for 5-10 minutes.  Cover sore areas with a warm, clean washcloth (compress) for 5-10 minutes. Contact a health care provider if:  You have a flare-up of hidradenitis suppurativa.  You have chills or a fever.  You are having trouble controlling your symptoms at home. This information is not intended to replace advice given to you by your health care provider. Make sure you discuss any questions you have with your health care provider. Document Released: 12/13/2003 Document Revised: 10/06/2015 Document Reviewed: 07/31/2013 Elsevier Interactive Patient Education  2018 [employer].

## 2017-05-31 NOTE — Progress Notes (Signed)
Subjective:    Patient ID: Cheryl Walters, female    DOB: 04-27-1991, 27 y.o.   MRN: [id number]  No chief complaint on file.   HPI Patient was seen today for abscess underneath L armpit.  Pt states the bump popped on its own, but she wanted to make sure it wasn't infected.  Pt states she gets bumps under her arm frequently.  She states she can feel one under each arm starting to form.  The one forming under her R arm is tender.  Pt denies fever, chills, N/V.  Past Medical History:  Diagnosis Date  . Acne   . Headache(784.0)   . Irregular periods   . Pilonidal cyst     No Known Allergies  ROS General: Denies fever, chills, night sweats, changes in weight, changes in appetite HEENT: Denies headaches, ear pain, changes in vision, rhinorrhea, sore throat CV: Denies CP, palpitations, SOB, orthopnea Pulm: Denies SOB, cough, wheezing GI: Denies abdominal pain, nausea, vomiting, diarrhea, constipation GU: Denies dysuria, hematuria, frequency, vaginal discharge Msk: Denies muscle cramps, joint pains Neuro: Denies weakness, numbness, tingling Skin: Denies rashes, bruising +abscess under left arm Psych: Denies depression, anxiety, hallucinations     Objective:    Blood pressure 120/80, pulse 68, temperature 98 F (36.7 C), temperature source Oral, weight 184 lb 11.2 oz (83.8 kg), unknown if currently breastfeeding.   Gen. Pleasant, well-nourished, in no distress, normal affect  HEENT: /AT, face symmetric, conjunctiva clear, no scleral icterus, PERRLA, nares patent without drainage Lungs: no accessory muscle use, CTAB, no wheezes or rales  Cardiovascular: RRR, no m/r/g, no peripheral edema Neuro:  A&Ox3, CN II-XII intact, normal gait Skin:  Warm.  Healing open pustule in L axilla.  1.5 cm round firm nodule appreciated under the skin of left armpit.  1 cm firm nodule mobile under the skin of the right armpit tender to palpation.   Wt Readings from Last 3 Encounters:  05/31/17  184 lb 11.2 oz (83.8 kg)  10/23/16 182 lb (82.6 kg)  04/12/16 176 lb 9.6 oz (80.1 kg)    Lab Results  Component Value Date   WBC 21.1 (H) 10/24/2016   HGB 11.0 (L) 10/24/2016   HCT 32.6 (L) 10/24/2016   PLT 284 10/24/2016   GLUCOSE 100 (H) 06/11/2014   CHOL 192 12/03/2007   TRIG 37 12/03/2007   HDL 83.2 12/03/2007   LDLCALC 101 (H) 12/03/2007   ALT 12 06/11/2014   AST 15 06/11/2014   NA 140 06/11/2014   K 4.5 06/11/2014   CL 106 06/11/2014   CREATININE 0.82 06/11/2014   BUN 8 06/11/2014   CO2 26 06/11/2014   TSH 2.502 06/11/2014   HGBA1C 5.3 06/11/2014    Assessment/Plan:  Hidradenitis suppurativa  -given handout -discussed ways to limit reocurrances-avoid shaving, etc. -Discussed possible need for I&D of nodules under skin they develop further/become painful. -Also discussed Derm referral.  Patient wishes to wait at this time - Plan: sulfamethoxazole-trimethoprim (BACTRIM DS,SEPTRA DS) 800-160 MG tablet -f/u prn -Given RTC precautions including fever, chills, erythema, etc.   Grier Mitts, MD

## 2017-06-12 ENCOUNTER — Encounter: Payer: Self-pay | Admitting: Family Medicine

## 2017-06-12 ENCOUNTER — Ambulatory Visit (INDEPENDENT_AMBULATORY_CARE_PROVIDER_SITE_OTHER): Payer: 59 | Admitting: Family Medicine

## 2017-06-12 VITALS — BP 102/70 | HR 72 | Temp 98.0°F | Wt 185.6 lb

## 2017-06-12 DIAGNOSIS — Z8669 Personal history of other diseases of the nervous system and sense organs: Secondary | ICD-10-CM

## 2017-06-12 MED ORDER — ONDANSETRON HCL 4 MG PO TABS: 4.0000 mg | ORAL_TABLET | Freq: Three times a day (TID) | ORAL | 3 refills | Status: DC | PRN

## 2017-06-12 MED ORDER — SUMATRIPTAN SUCCINATE 25 MG PO TABS: 25.0000 mg | ORAL_TABLET | Freq: Once | ORAL | 3 refills | Status: DC

## 2017-06-12 NOTE — Patient Instructions (Addendum)
Migraine Headache A migraine headache is an intense, throbbing pain on one side or both sides of the head. Migraines may also cause other symptoms, such as nausea, vomiting, and sensitivity to light and noise. What are the causes? Doing or taking certain things may also trigger migraines, such as:  Alcohol.  Smoking.  Medicines, such as: ? Medicine used to treat chest pain (nitroglycerine). ? Birth control pills. ? Estrogen pills. ? Certain blood pressure medicines.  Aged cheeses, chocolate, or caffeine.  Foods or drinks that contain nitrates, glutamate, aspartame, or tyramine.  Physical activity.  Other things that may trigger a migraine include:  Menstruation.  Pregnancy.  Hunger.  Stress, lack of sleep, too much sleep, or fatigue.  Weather changes.  What increases the risk? The following factors may make you more likely to experience migraine headaches:  Age. Risk increases with age.  Family history of migraine headaches.  Being Caucasian.  Depression and anxiety.  Obesity.  Being a woman.  Having a hole in the heart (patent foramen ovale) or other heart problems.  What are the signs or symptoms? The main symptom of this condition is pulsating or throbbing pain. Pain may:  Happen in any area of the head, such as on one side or both sides.  Interfere with daily activities.  Get worse with physical activity.  Get worse with exposure to bright lights or loud noises.  Other symptoms may include:  Nausea.  Vomiting.  Dizziness.  General sensitivity to bright lights, loud noises, or smells.  Before you get a migraine, you may get warning signs that a migraine is developing (aura). An aura may include:  Seeing flashing lights or having blind spots.  Seeing bright spots, halos, or zigzag lines.  Having tunnel vision or blurred vision.  Having numbness or a tingling feeling.  Having trouble talking.  Having muscle weakness.  How is this  diagnosed? A migraine headache can be diagnosed based on:  Your symptoms.  A physical exam.  Tests, such as CT scan or MRI of the head. These imaging tests can help rule out other causes of headaches.  Taking fluid from the spine (lumbar puncture) and analyzing it (cerebrospinal fluid analysis, or CSF analysis).  How is this treated? A migraine headache is usually treated with medicines that:  Relieve pain.  Relieve nausea.  Prevent migraines from coming back.  Treatment may also include:  Acupuncture.  Lifestyle changes like avoiding foods that trigger migraines.  Follow these instructions at home: Medicines  Take over-the-counter and prescription medicines only as told by your health care provider.  Do not drive or use heavy machinery while taking prescription pain medicine.  To prevent or treat constipation while you are taking prescription pain medicine, your health care provider may recommend that you: ? Drink enough fluid to keep your urine clear or pale yellow. ? Take over-the-counter or prescription medicines. ? Eat foods that are high in fiber, such as fresh fruits and vegetables, whole grains, and beans. ? Limit foods that are high in fat and processed sugars, such as fried and sweet foods. Lifestyle  Avoid alcohol use.  Do not use any products that contain nicotine or tobacco, such as cigarettes and e-cigarettes. If you need help quitting, ask your health care provider.  Get at least 8 hours of sleep every night.  Limit your stress. General instructions   Keep a journal to find out what may trigger your migraine headaches. For example, write down: ? What you eat and  drink. ? How much sleep you get. ? Any change to your diet or medicines.  If you have a migraine: ? Avoid things that make your symptoms worse, such as bright lights. ? It may help to lie down in a dark, quiet room. ? Do not drive or use heavy machinery. ? Ask your health care provider  what activities are safe for you while you are experiencing symptoms.  Keep all follow-up visits as told by your health care provider. This is important. Contact a health care provider if:  You develop symptoms that are different or more severe than your usual migraine symptoms. Get help right away if:  Your migraine becomes severe.  You have a fever.  You have a stiff neck.  You have vision loss.  Your muscles feel weak or like you cannot control them.  You start to lose your balance often.  You develop trouble walking.  You faint. This information is not intended to replace advice given to you by your health care provider. Make sure you discuss any questions you have with your health care provider. Document Released: 04/30/2005 Document Revised: 11/18/2015 Document Reviewed: 10/17/2015 Elsevier Interactive Patient Education  2017 [place]. Sumatriptan tablets What is this medicine? SUMATRIPTAN (soo ma TRIP tan) is used to treat migraines with or without aura. An aura is a strange feeling or visual disturbance that warns you of an attack. It is not used to prevent migraines. This medicine may be used for other purposes; ask your health care provider or pharmacist if you have questions. COMMON BRAND NAME(S): Imitrex, Migraine Pack What should I tell my health care provider before I take this medicine? They need to know if you have any of these conditions: -circulation problems in fingers and toes -diabetes -heart disease -high blood pressure -high cholesterol -history of irregular heartbeat -history of stroke -kidney disease -liver disease -postmenopausal or surgical removal of uterus and ovaries -seizures -smoke tobacco -stomach or intestine problems -an unusual or allergic reaction to sumatriptan, other medicines, foods, dyes, or preservatives -pregnant or trying to get pregnant -breast-feeding How should I use this medicine? Take this medicine by mouth with a  glass of water. Follow the directions on the prescription label. This medicine is taken at the first symptoms of a migraine. It is not for everyday use. If your migraine headache returns after one dose, you can take another dose as directed. You must leave at least 2 hours between doses, and do not take more than 100 mg as a single dose. Do not take more than 200 mg total in any 24 hour period. If there is no improvement at all after the first dose, do not take a second dose without talking to your doctor or health care professional. Do not take your medicine more often than directed. Talk to your pediatrician regarding the use of this medicine in children. Special care may be needed. Overdosage: If you think you have taken too much of this medicine contact a poison control center or emergency room at once. NOTE: This medicine is only for you. Do not share this medicine with others. What if I miss a dose? This does not apply; this medicine is not for regular use. What may interact with this medicine? Do not take this medicine with any of the following medicines: -cocaine -ergot alkaloids like dihydroergotamine, ergonovine, ergotamine, methylergonovine -feverfew -MAOIs like Carbex, Eldepryl, Marplan, Nardil, and Parnate -other medicines for migraine headache like almotriptan, eletriptan, frovatriptan, naratriptan, rizatriptan, zolmitriptan -tryptophan This  medicine may also interact with the following medications: -certain medicines for depression, anxiety, or psychotic disturbances This list may not describe all possible interactions. Give your health care provider a list of all the medicines, herbs, non-prescription drugs, or dietary supplements you use. Also tell them if you smoke, drink alcohol, or use illegal drugs. Some items may interact with your medicine. What should I watch for while using this medicine? Only take this medicine for a migraine headache. Take it if you get warning symptoms  or at the start of a migraine attack. It is not for regular use to prevent migraine attacks. You may get drowsy or dizzy. Do not drive, use machinery, or do anything that needs mental alertness until you know how this medicine affects you. To reduce dizzy or fainting spells, do not sit or stand up quickly, especially if you are an older patient. Alcohol can increase drowsiness, dizziness and flushing. Avoid alcoholic drinks. Smoking cigarettes may increase the risk of heart-related side effects from using this medicine. If you take migraine medicines for 10 or more days a month, your migraines may get worse. Keep a diary of headache days and medicine use. Contact your healthcare professional if your migraine attacks occur more frequently. What side effects may I notice from receiving this medicine? Side effects that you should report to your doctor or health care professional as soon as possible: -allergic reactions like skin rash, itching or hives, swelling of the face, lips, or tongue -bloody or watery diarrhea -hallucination, loss of contact with reality -pain, tingling, numbness in the face, hands, or feet -seizures -signs and symptoms of a blood clot such as breathing problems; changes in vision; chest pain; severe, sudden headache; pain, swelling, warmth in the leg; trouble speaking; sudden numbness or weakness of the face, arm, or leg -signs and symptoms of a dangerous change in heartbeat or heart rhythm like chest pain; dizziness; fast or irregular heartbeat; palpitations, feeling faint or lightheaded; falls; breathing problems -signs and symptoms of a stroke like changes in vision; confusion; trouble speaking or understanding; severe headaches; sudden numbness or weakness of the face, arm, or leg; trouble walking; dizziness; loss of balance or coordination -stomach pain Side effects that usually do not require medical attention (report to your doctor or health care professional if they  continue or are bothersome): -changes in taste -facial flushing -headache -muscle cramps -muscle pain -nausea, vomiting -weak or tired This list may not describe all possible side effects. Call your doctor for medical advice about side effects. You may report side effects to FDA at 1-800-FDA-1088. Where should I keep my medicine? Keep out of the reach of children. Store at room temperature between 2 and 30 degrees C (36 and 86 degrees F). Throw away any unused medicine after the expiration date. NOTE: This sheet is a summary. It may not cover all possible information. If you have questions about this medicine, talk to your doctor, pharmacist, or health care provider.  2018 Elsevier/Gold Standard (2015-06-02 12:38:23)

## 2017-06-12 NOTE — Progress Notes (Signed)
Subjective:    Patient ID: Cheryl Walters, female    DOB: 08-18-1990, 27 y.o.   MRN: [id number]  Chief Complaint  Patient presents with  . Migraine    HPI Patient was seen today for ongoing concern.  Patient endorses a migraine headache once every 2-3 weeks. Pt has n/v, sensitivity to light, and sensitivity to sound during her headaches.  Pt typically feels better with emesis. Pt also endorses regular headaches in between.  Pt states she has tried Excedrin and Tylenol Extra Strength for her headaches with no relief.  Pt states when she has a headache it hurts all over and not in 1 specific place.  Pt denies aura.  Pt states her mother has a history of migraines and is seen by the headache clinic in town.  Past Medical History:  Diagnosis Date  . Acne   . Headache(784.0)   . Irregular periods   . Pilonidal cyst     No Known Allergies  ROS General: Denies fever, chills, night sweats, changes in weight, changes in appetite HEENT: Denies ear pain, changes in vision, rhinorrhea, sore throat  +HAs, migraines, sensitivity to light and noise. CV: Denies CP, palpitations, SOB, orthopnea Pulm: Denies SOB, cough, wheezing GI: Denies abdominal pain, diarrhea, constipation +nausea, vomiting GU: Denies dysuria, hematuria, frequency, vaginal discharge Msk: Denies muscle cramps, joint pains Neuro: Denies weakness, numbness, tingling Skin: Denies rashes, bruising Psych: Denies depression, anxiety, hallucinations     Objective:    Blood pressure 102/70, pulse 72, temperature 98 F (36.7 C), temperature source Oral, weight 185 lb 9.6 oz (84.2 kg), not currently breastfeeding.   Gen. Pleasant, well-nourished, in no distress, normal affect   HEENT: [place]/AT, face symmetric, no scleral icterus, PERRLA, EOMI, no nystagmus, nares patent without drainage, pharynx without erythema or exudate. Lungs: no accessory muscle use, CTAB, no wheezes or rales Cardiovascular: RRR, no peripheral edema Neuro:   A&Ox3, CN II-XII intact, normal gait  Wt Readings from Last 3 Encounters:  06/12/17 185 lb 9.6 oz (84.2 kg)  05/31/17 184 lb 11.2 oz (83.8 kg)  10/23/16 182 lb (82.6 kg)    Lab Results  Component Value Date   WBC 21.1 (H) 10/24/2016   HGB 11.0 (L) 10/24/2016   HCT 32.6 (L) 10/24/2016   PLT 284 10/24/2016   GLUCOSE 100 (H) 06/11/2014   CHOL 192 12/03/2007   TRIG 37 12/03/2007   HDL 83.2 12/03/2007   LDLCALC 101 (H) 12/03/2007   ALT 12 06/11/2014   AST 15 06/11/2014   NA 140 06/11/2014   K 4.5 06/11/2014   CL 106 06/11/2014   CREATININE 0.82 06/11/2014   BUN 8 06/11/2014   CO2 26 06/11/2014   TSH 2.502 06/11/2014   HGBA1C 5.3 06/11/2014    Assessment/Plan:  Hx of migraines  -Given migraines q 2-3 wks will start abortive medication. -Given handout on migraines. -Pt advised to increase po intake of water, get plenty of sleep, and to limit caffeine use. - Plan: ondansetron (ZOFRAN) 4 MG tablet, SUMAtriptan (IMITREX) 25 MG tablet  -F/u prn  Grier Mitts, MD

## 2017-07-24 ENCOUNTER — Ambulatory Visit (INDEPENDENT_AMBULATORY_CARE_PROVIDER_SITE_OTHER): Payer: 59 | Admitting: Family Medicine

## 2017-07-24 ENCOUNTER — Encounter: Payer: Self-pay | Admitting: Family Medicine

## 2017-07-24 VITALS — BP 122/74 | HR 98 | Temp 98.2°F | Ht 60.0 in | Wt 179.0 lb

## 2017-07-24 DIAGNOSIS — J029 Acute pharyngitis, unspecified: Secondary | ICD-10-CM

## 2017-07-24 DIAGNOSIS — J019 Acute sinusitis, unspecified: Secondary | ICD-10-CM | POA: Diagnosis not present

## 2017-07-24 DIAGNOSIS — H1031 Unspecified acute conjunctivitis, right eye: Secondary | ICD-10-CM

## 2017-07-24 LAB — POCT RAPID STREP A (OFFICE): RAPID STREP A SCREEN: NEGATIVE

## 2017-07-24 MED ORDER — FLUTICASONE PROPIONATE 50 MCG/ACT NA SUSP: 1.0000 | Freq: Every day | NASAL | 0 refills | Status: DC

## 2017-07-24 MED ORDER — POLYMYXIN B-TRIMETHOPRIM 10000-0.1 UNIT/ML-% OP SOLN
1.0000 [drp] | OPHTHALMIC | 0 refills | Status: AC
Start: 2017-07-24 — End: 2017-07-31

## 2017-07-24 MED ORDER — AMOXICILLIN 500 MG PO CAPS: 500.0000 mg | ORAL_CAPSULE | Freq: Two times a day (BID) | ORAL | 0 refills | Status: AC

## 2017-07-24 NOTE — Patient Instructions (Addendum)

## 2017-07-24 NOTE — Progress Notes (Signed)
Subjective:    Patient ID: Cheryl Walters, female    DOB: [birth year], 27 y.o.   MRN: [id number]  Chief Complaint  Patient presents with  . Cough    HPI Patient was seen today for acute concern.  Pt endorses sore throat, cough, facial pain, HA, rhinorrhea since Thursday of last wk.  Pt denies fever, but does feel like she has been hot.  She took Dayquil today.  Pt's younger sister was recently sick.  Pt also mentions waking up today with red, itchy, crusted R eye.  Past Medical History:  Diagnosis Date  . Acne   . Headache(784.0)   . Irregular periods   . Pilonidal cyst     No Known Allergies  ROS General: Denies fever, chills, night sweats, changes in weight, changes in appetite   HEENT: Denies ear pain, changes in vision    +HA, facial pain, rhinorrhea, sore throat, red R eye CV: Denies CP, palpitations, SOB, orthopnea Pulm: Denies SOB, wheezing  +cough GI: Denies abdominal pain, nausea, vomiting, diarrhea, constipation GU: Denies dysuria, hematuria, frequency, vaginal discharge Msk: Denies muscle cramps, joint pains Neuro: Denies weakness, numbness, tingling Skin: Denies rashes, bruising Psych: Denies depression, anxiety, hallucinations     Objective:    Blood pressure 122/74, pulse 98, temperature 98.2 F (36.8 C), temperature source Oral, height 5' (1.524 m), weight 179 lb (81.2 kg), SpO2 98 %, not currently breastfeeding.   Gen. Pleasant, well-nourished, in no distress, normal affect   HEENT: [place]/AT, face symmetric, R eye with mild conjunctival injection and crusting in medial corner, no scleral icterus, PERRLA, nares patent with clear drainage, pharynx with erythema and no exudate.  TMs normal b/l.  No cervical lymphadenopathy. Lungs: no accessory muscle use, CTAB, no wheezes or rales Cardiovascular: RRR, no m/r/g, no peripheral edema Abdomen: BS present, soft, NT/ND Neuro:  A&Ox3, CN II-XII intact, normal gait   Wt Readings from Last 3 Encounters:  07/24/17  179 lb (81.2 kg)  06/12/17 185 lb 9.6 oz (84.2 kg)  05/31/17 184 lb 11.2 oz (83.8 kg)    Lab Results  Component Value Date   WBC 21.1 (H) 10/24/2016   HGB 11.0 (L) 10/24/2016   HCT 32.6 (L) 10/24/2016   PLT 284 10/24/2016   GLUCOSE 100 (H) 06/11/2014   CHOL 192 12/03/2007   TRIG 37 12/03/2007   HDL 83.2 12/03/2007   LDLCALC 101 (H) 12/03/2007   ALT 12 06/11/2014   AST 15 06/11/2014   NA 140 06/11/2014   K 4.5 06/11/2014   CL 106 06/11/2014   CREATININE 0.82 06/11/2014   BUN 8 06/11/2014   CO2 26 06/11/2014   TSH 2.502 06/11/2014   HGBA1C 5.3 06/11/2014    Assessment/Plan:  Acute non-recurrent sinusitis, unspecified location -supportive care -given handout - Plan: amoxicillin (AMOXIL) 500 MG capsule, fluticasone (FLONASE) 50 MCG/ACT nasal spray  Sore throat  - Plan: POCT rapid strep A  Negative  Conjunctivitis -polytrim ophthalmic soln sent to pharmacy -discussed ways to prevent spreading to the other eye and to other people.  F/u prn.  Grier Mitts, MD

## 2017-07-25 ENCOUNTER — Ambulatory Visit: Payer: Self-pay | Admitting: *Deleted

## 2017-07-25 NOTE — Telephone Encounter (Signed)
Pt asking if she could take over the counter medication for pain to treat right sided face pain. Pt is currently taking Amoxicillin for treatment of sinusitis. Advised pt that it was ok to take Tylenol or Ibuprofen for pain. Pt advised to call the office back if current symptoms continued even with taking OTC the medications for the next couple of days. Pt verbalized understanding.  Reason for Disposition . Caller has medication question only, adult not sick, and triager answers question  Answer Assessment - Initial Assessment Questions 1. SYMPTOMS: "Do you have any symptoms?"     Facial Pain, recently seen in the office and diagnosed with sinusitis 2. SEVERITY: If symptoms are present, ask "Are they mild, moderate or severe?"    moderate  Protocols used: MEDICATION QUESTION CALL-A-AH

## 2017-08-01 ENCOUNTER — Ambulatory Visit (INDEPENDENT_AMBULATORY_CARE_PROVIDER_SITE_OTHER): Payer: 59 | Admitting: Physician Assistant

## 2017-08-01 ENCOUNTER — Telehealth: Payer: Self-pay | Admitting: Physician Assistant

## 2017-08-01 ENCOUNTER — Encounter: Payer: Self-pay | Admitting: Physician Assistant

## 2017-08-01 VITALS — BP 118/70 | HR 87 | Ht 60.0 in | Wt 184.0 lb

## 2017-08-01 DIAGNOSIS — K51211 Ulcerative (chronic) proctitis with rectal bleeding: Secondary | ICD-10-CM | POA: Diagnosis not present

## 2017-08-01 MED ORDER — MESALAMINE 1000 MG RE SUPP: 1000.0000 mg | Freq: Every day | RECTAL | 2 refills | Status: DC

## 2017-08-01 NOTE — Patient Instructions (Signed)
If you are age 29 or older, your body mass index should be between 23-30. Your Body mass index is 35.94 kg/m. If this is out of the aforementioned range listed, please consider follow up with your Primary Care Provider.  If you are age 55 or younger, your body mass index should be between 19-25. Your Body mass index is 35.94 kg/m. If this is out of the aformentioned range listed, please consider follow up with your Primary Care Provider.   We have sent the following medications to your pharmacy for you to pick up at your convenience: Canasa 1,022m suppository everyday at bedtime.

## 2017-08-01 NOTE — Progress Notes (Signed)
Chief Complaint: Rectal bleeding, history of ulcerative proctitis  HPI:    Cheryl Walters is a 27 year old female with a past medical history as listed below including ulcerative proctitis, who previously followed with Dr. Olevia Perches and was assigned to Dr. Silverio Decamp, who returns to clinic today with a complaint of rectal bleeding.    04/12/16 OV [facility], previous colonoscopy noted March 2016 with findings of proctitis from 0-5 cm consistent with ulcerative proctitis with edematous friable mucosa with granularity.  Biopsies showed chronic active inflammation consistent with IBD.  At that time she was given Anusol HC suppositories.  At time of follow-up patient had intermittent scant amount of rectal bleeding which had increased in frequency.  Patient was given Canasa suppositories at bedtime.  It was recommend she stay on these for 8-10 weeks.    Today, explains that she stayed on the Canasa suppositories as above, but the second that she stopped using them she continued with some rectal bleeding and episodes of tenesmus with passage of small amounts of mucus and blood in her stool.  She has been having this over the past couple of years but tells me the Canasa suppositories were previously too expensive to stay on.  She has recently had a change in insurance.  No new symptoms.    Denies fever, chills, weight loss, abdominal pain, rectal pain or symptoms that awaken her at night.  Past Medical History:  Diagnosis Date  . Acne   . Headache(784.0)   . Irregular periods   . Pilonidal cyst     Past Surgical History:  Procedure Laterality Date  . BIRTH CONTROL IMPLANT    . CESAREAN SECTION      Current Outpatient Medications  Medication Sig Dispense Refill  . SUMAtriptan (IMITREX) 25 MG tablet Take 1 tablet (25 mg total) by mouth once for 1 dose. May repeat in 2 hours if headache persists or recurs. 10 tablet 3   No current facility-administered medications for this visit.     Allergies  as of 08/01/2017  . (No Known Allergies)    Family History  Problem Relation Age of Onset  . Other Mother        irreg periods  . Other Sister        irreg periods  . Sleep apnea Father        on machine  . Breast cancer Maternal Aunt   . Brain cancer Paternal Aunt   . Colon cancer Neg Hx   . Throat cancer Neg Hx   . Kidney disease Neg Hx   . Liver disease Neg Hx   . Diabetes Neg Hx   . Heart disease Neg Hx   . Stomach cancer Neg Hx     Social History   Socioeconomic History  . Marital status: Married    Spouse name: Not on file  . Number of children: Not on file  . Years of education: Not on file  . Highest education level: Not on file  Occupational History  . Not on file  Social Needs  . Financial resource strain: Not on file  . Food insecurity:    Worry: Not on file    Inability: Not on file  . Transportation needs:    Medical: Not on file    Non-medical: Not on file  Tobacco Use  . Smoking status: Never Smoker  . Smokeless tobacco: Never Used  Substance and Sexual Activity  . Alcohol use: Yes    Alcohol/week: 0.0 oz  Comment: 3 every other week  . Drug use: No  . Sexual activity: Not on file  Lifestyle  . Physical activity:    Days per week: Not on file    Minutes per session: Not on file  . Stress: Not on file  Relationships  . Social connections:    Talks on phone: Not on file    Gets together: Not on file    Attends religious service: Not on file    Active member of club or organization: Not on file    Attends meetings of clubs or organizations: Not on file    Relationship status: Not on file  . Intimate partner violence:    Fear of current or ex partner: Not on file    Emotionally abused: Not on file    Physically abused: Not on file    Forced sexual activity: Not on file  Other Topics Concern  . 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[employer]   Not working out of home at this time    NOw living iwht in [employer] and 4 years okd husband    Small quarters at home all day with 27 yo    To go to pre k if gets accepted  Neg  ocass etoh          Review of Systems:    Constitutional: No weight loss, fever or chills Cardiovascular: No chest pain Respiratory: No SOB  Gastrointestinal: See HPI and otherwise negative   Physical Exam:  Vital signs: BP 118/70   Pulse 87   Ht 5' (1.524 m)   Wt 184 lb (83.5 kg)   BMI 35.94 kg/m   Constitutional:   Pleasant female appears to be in NAD, Well developed, Well nourished, alert and cooperative Respiratory: Respirations even and unlabored. Lungs clear to auscultation bilaterally.   No wheezes, crackles, or rhonchi.  Cardiovascular: Normal S1, S2. No MRG. Regular rate and rhythm. No peripheral edema, cyanosis or pallor.  Gastrointestinal:  Soft, nondistended, nontender. No rebound or guarding. Normal bowel sounds. No appreciable masses or hepatomegaly. Rectal:  External: external hemorrhoid tag; Internal: no ttp, no discharge Psychiatric:  Demonstrates good judgement and reason without abnormal affect or behaviors.  RELEVANT LABS AND IMAGING: CBC    Component Value Date/Time   WBC 21.1 (H) 10/24/2016 0557   RBC 3.93 10/24/2016 0557   HGB 11.0 (L) 10/24/2016 0557   HCT 32.6 (L) 10/24/2016 0557   PLT 284 10/24/2016 0557   MCV 83.0 10/24/2016 0557   MCH 28.0 10/24/2016 0557   MCHC 33.7 10/24/2016 0557   RDW 13.7 10/24/2016 0557   LYMPHSABS 3.0 04/12/2016 1047   MONOABS 0.8 04/12/2016 1047   EOSABS 0.1 04/12/2016 1047   BASOSABS 0.1 04/12/2016 1047    CMP     Component Value Date/Time   NA 140 06/11/2014 1631   K 4.5 06/11/2014 1631   CL 106 06/11/2014 1631   CO2 26 06/11/2014 1631   GLUCOSE 100 (H) 06/11/2014 1631   BUN 8 06/11/2014 1631    CREATININE 0.82 06/11/2014 1631   CALCIUM 8.4 06/11/2014 1631   PROT 5.3 (  L) 06/11/2014 1631   ALBUMIN 3.1 (L) 06/11/2014 1631   AST 15 06/11/2014 1631   ALT 12 06/11/2014 1631   ALKPHOS 49 06/11/2014 1631   BILITOT 0.4 06/11/2014 1631   GFRNONAA >60 03/16/2010 1320   GFRAA  03/16/2010 1320    >60        The eGFR has been calculated using the MDRD equation. This calculation has not been validated in all clinical situations. eGFR's persistently <60 mL/min signify possible Chronic Kidney Disease.    Assessment: 1.  Ulcerative proctitis: Rectal bleeding consistent with flare of ulcerative proctitis, patient has done well with Canasa suppositories in the past, will restart these today, did discuss possibly an oral mesalamine in the future  Plan: 1.  Refilled patient's Canasa suppositories at bedtime for 2 months. 2.  Discussed with the patient that I would like her to see her new doctor, Dr. Silverio Decamp so that they can decide together if an oral mesalamine may be the best way to treat her long-term.  It may be that Dr. Silverio Decamp would like to take another look at her colon, possibly a flex sig or full colonoscopy as it has been 4 years since this was done and patient has been off of therapy for some time.  Patient verbalized understanding. 3.  Patient to follow in clinic with Dr. Silverio Decamp in 2 months.  Ellouise Newer, PA-C [facility] 08/01/2017, 3:00 PM  Cc: Burnis Medin, MD

## 2017-08-02 NOTE — Telephone Encounter (Signed)
Will initiate PA

## 2017-08-02 NOTE — Telephone Encounter (Signed)
Her insurance will not cover the supositories

## 2017-08-05 NOTE — Progress Notes (Signed)
Reviewed and agree with documentation and assessment and plan. K. Veena Nandigam , MD   

## 2017-08-07 NOTE — Telephone Encounter (Signed)
Spoke to the Pharmacist, the Medication was approved after the PA.

## 2017-09-03 NOTE — Progress Notes (Signed)
Chief Complaint  Patient presents with  . Wrist Pain    Pain in right wrist for about a month now    HPI: Cheryl Walters 27 y.o. come in for    appt made for headaches but has wrist pain with motiobs and no injury   Getting bad headaches and migraine.   And then  Worse after dr bank  Taking   sumatriptan 25 and repeat.  about 3 per months  Begins middle of head and throbbing  iud no periods .   r wrist   Sore with motion ext and flex  Is right handed but  picking up child mostly with left  Some key boarding  No weakness   No swelliong  No injury  ROS: See pertinent positives and negatives per HPI.  Past Medical History:  Diagnosis Date  . Acne   . Active preterm labor 10/23/2016  . Headache(784.0)   . Irregular periods   . Pilonidal cyst     Family History  Problem Relation Age of Onset  . Other Mother        irreg periods  . Other Sister        irreg periods  . Sleep apnea Father        on machine  . Breast cancer Maternal Aunt   . Brain cancer Paternal Aunt   . Colon cancer Neg Hx   . Throat cancer Neg Hx   . Kidney disease Neg Hx   . Liver disease Neg Hx   . Diabetes Neg Hx   . Heart disease Neg Hx   . Stomach cancer Neg Hx     Social History   Socioeconomic History  . Marital status: Married    Spouse name: Not on file  . Number of children: Not on file  . Years of education: Not on file  . Highest education level: Not on file  Occupational History  . Not on file  Social Needs  . Financial resource strain: Not on file  . Food insecurity:    Worry: Not on file    Inability: Not on file  . Transportation needs:    Medical: Not on file    Non-medical: Not on file  Tobacco Use  . Smoking status: Never Smoker  . Smokeless tobacco: Never Used  Substance and Sexual Activity  . Alcohol use: Yes    Alcohol/week: 0.0 oz    Comment: 3 every other week  . Drug use: No  . Sexual activity: Not on file  Lifestyle  . Physical activity:    Days per  week: Not on file    Minutes per session: Not on file  . Stress: Not on file  Relationships  . Social connections:    Talks on phone: Not on file    Gets together: Not on file    Attends religious service: Not on file    Active member of club or organization: Not on file    Attends meetings of clubs or organizations: Not on file    Relationship status: Not on file  Other Topics Concern  . 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 mesalamine (CANASA) 1000 MG suppository Place 1 suppository (1,000 mg total) rectally at bedtime. 30 suppository 2  . SUMAtriptan (IMITREX) 25 MG tablet Take 1 tablet (25 mg total) by mouth once for 1 dose. May repeat in 2 hours if headache persists or recurs. 10 tablet 3   No facility-administered medications prior to visit.      EXAM:  BP 114/76 (BP Location: Right Arm, Patient Position: Sitting, Cuff Size: Normal)   Pulse 82   Temp 98.4 F (36.9 C) (Oral)   Ht 5' (1.524 m)   Wt 180 lb 3.2 oz (81.7 kg)   SpO2 97%   BMI 35.19 kg/m   Body mass index is 35.19 kg/m.  GENERAL: vitals reviewed and listed above, alert, oriented, appears well hydrated and in no acute distress MS: moves all extremities without noticeable focal  Abnormality r wrist   Nl inspection  No point tenderness or swelling    nv ok rom nl ext and flex tend no crepitus  Here with  Infant child able to pick yup uses lef t  more than right  PSYCH: pleasant and cooperative, no obvious  depression or anxiety  BP Readings from Last 3 Encounters:  09/04/17 114/76  08/01/17 118/70  07/24/17 122/74    ASSESSMENT AND PLAN:  Discussed the following assessment and plan:  Nonintractable headache, unspecified chronicity pattern, unspecified headache type  Hx of migraines  Wrist pain, right - prob tendinitis    over use is right ha nded  but no specific injury   dosen not seem arthritis in nature   IUD (intrauterine device)  Advised can get wrist support during day and motions    Do not think  X ray is indicated at this time   Cold end of day .   -Patient advised to return or notify health care team  if  new concerns arise.  Patient Instructions  Advise  We increase the sumatriptan to 100 mg     Dose ( higher)    Can also take  2 generic aleve at the same time if needed .   Calendar the  Headaches as to how often using medication .  Taking frequent medication can   cause rebound headaches so  Avoid if possible  Excedrin migraine  Except in  Needed circumstances   As rescue medicine .     Recurrent Migraine Headache Migraines are a type of headache, and they are usually stronger and more sudden than normal headaches (tension headaches). Migraines are characterized by an intense pulsing, throbbing pain that is usually only present on one side of the head. Sometimes, migraine headaches can cause nausea, vomiting, sensitivity to light and sound, and vision changes. Recurrent migraines keep coming back (recurring). A migraine can last from 4 hours up to 3 days. What are the causes? The exact cause of this condition is not known. However, a migraine may be caused when nerves in the brain become irritated and release chemicals that cause inflammation of blood vessels. This inflammation causes pain. Certain things may  also trigger migraines, such as:  A disruption in your regular eating and sleeping schedule.  Smoking.  Stress.  Menstruation.  Certain foods and drinks,  such as: ? Aged cheese. ? Chocolate. ? Alcohol. ? Caffeine. ? Foods or drinks that contain nitrates, glutamate, aspartame, MSG, or tyramine.  Lack of sleep.  Hunger.  Physical exertion.  Fatigue.  High altitude.  Weather changes.  Medicines, such as: ? Nitroglycerin, which is used to treat chest pain. ? Birth control pills. ? Estrogen. ? Some blood pressure medicines.  What are the signs or symptoms? Symptoms of this condition vary for each person and may include:  Pain that is usually only present on one side of the head. In some cases, the pain may be on both sides of the head or around the head or neck.  Pulsating or throbbing pain.  Severe pain that prevents daily activities.  Pain that is aggravated by any physical activity.  Nausea, vomiting, or both.  Dizziness.  Pain with exposure to bright lights, loud noises, or activity.  General sensitivity to bright lights, loud noises, or smells.  Before you get a migraine, you may get warning signs that a migraine is coming (aura). An aura may include:  Seeing flashing lights.  Seeing bright spots, halos, or zigzag lines.  Having tunnel vision or blurred vision.  Having numbness or a tingling feeling.  Having trouble talking.  Having muscle weakness.  Smelling a certain odor.  How is this diagnosed? This condition is often diagnosed based on:  Your symptoms and medical history.  A physical exam.  You may also have tests, including:  A CT scan or MRI of your brain. These imaging tests cannot diagnose migraines, but they can help to rule out other causes of headaches.  Blood tests.  How is this treated? This condition is treated with:  Medicines. These are used for: ? Lessening pain and nausea. ? Preventing recurrent migraines.  Lifestyle changes, such as changes to your diet or sleeping patterns.  Behavior therapy, such as relaxation training or biofeedback. Biofeedback is a treatment  that involves teaching you to relax and use your brain to lower your heart rate and control your breathing.  Follow these instructions at home: Medicines  Take over-the-counter and prescription medicines only as told by your health care provider.  Do not drive or use heavy machinery while taking prescription pain medicine. Lifestyle  Do not use any products that contain nicotine or tobacco, such as cigarettes and e-cigarettes. If you need help quitting, ask your health care provider.  Limit alcohol intake to no more than 1 drink a day for nonpregnant women and 2 drinks a day for men. One drink equals 12 oz of beer, 5 oz of wine, or 1 oz of hard liquor.  Get 7-9 hours of sleep each night, or the amount of sleep recommended by your health care provider.  Limit your stress. Talk with your health care provider if you need help with stress management.  Maintain a healthy weight. If you need help losing weight, ask your health care provider.  Exercise regularly. Aim for 150 minutes of moderate-intensity exercise (walking, biking, yoga) or 75 minutes of vigorous exercise (running, circuit training, swimming) each week. General instructions  Keep a journal to find out what triggers your migraine headaches so you can avoid these triggers. For example, write down: ? What you eat and drink. ? How much sleep you get. ? Any change to your diet or medicines.  Lie down in a dark, quiet room when you have a migraine.  Try placing a cool towel over your head when you have a migraine.  Keep lights dim, if bright lights bother you and make your migraines worse.  Keep all follow-up visits as told by your health care provider. This is important. Contact a health care provider if:  Your pain does not improve, even with medicine.  Your migraines continue to return, even with medicine.  You have a fever.  You have weight loss. Get help right away if:  Your migraine becomes severe and medicine  does not help.  You have a stiff neck.  You have a loss of vision.  You have muscle weakness or loss of muscle control.  You start losing your balance or have trouble walking.  You feel faint or you pass out.  You develop new, severe symptoms.  You start having abrupt severe headaches that last for a second or less, like a thunderclap. Summary  Migraine headaches are usually stronger and more sudden than normal headaches (tension headaches). Migraines are characterized by an intense pulsing, throbbing pain that is usually only present on one side of the head.  The exact cause of this condition is not known. However, a migraine may be caused when nerves in the brain become irritated and release chemicals that cause inflammation of blood vessels.  Certain things may trigger migraines, such as changes to diet or sleeping patterns, smoking, certain foods, alcohol, stress, and certain medicines.  Sometimes, migraine headaches can cause nausea, vomiting, sensitivity to light and sound, and vision changes.  Migraines are often diagnosed based on your symptoms, medical history, and a physical exam. This information is not intended to replace advice given to you by your health care provider. Make sure you discuss any questions you have with your health care provider. Document Released: 01/23/2001 Document Revised: 02/10/2016 Document Reviewed: 02/10/2016 Elsevier Interactive Patient Education  2018 [place]. Ascher Schroepfer M.D.

## 2017-09-04 ENCOUNTER — Encounter: Payer: Self-pay | Admitting: Internal Medicine

## 2017-09-04 ENCOUNTER — Ambulatory Visit (INDEPENDENT_AMBULATORY_CARE_PROVIDER_SITE_OTHER): Payer: 59 | Admitting: Internal Medicine

## 2017-09-04 VITALS — BP 114/76 | HR 82 | Temp 98.4°F | Ht 60.0 in | Wt 180.2 lb

## 2017-09-04 DIAGNOSIS — Z8669 Personal history of other diseases of the nervous system and sense organs: Secondary | ICD-10-CM

## 2017-09-04 DIAGNOSIS — M25531 Pain in right wrist: Secondary | ICD-10-CM | POA: Diagnosis not present

## 2017-09-04 DIAGNOSIS — Z975 Presence of (intrauterine) contraceptive device: Secondary | ICD-10-CM | POA: Diagnosis not present

## 2017-09-04 DIAGNOSIS — R51 Headache: Secondary | ICD-10-CM | POA: Diagnosis not present

## 2017-09-04 DIAGNOSIS — R519 Headache, unspecified: Secondary | ICD-10-CM

## 2017-09-04 MED ORDER — SUMATRIPTAN SUCCINATE 100 MG PO TABS: ORAL_TABLET | ORAL | 3 refills | Status: DC

## 2017-09-04 NOTE — Patient Instructions (Addendum)
Advise  We increase the sumatriptan to 100 mg     Dose ( higher)    Can also take  2 generic aleve at the same time if needed .   Calendar the  Headaches as to how often using medication .  Taking frequent medication can   cause rebound headaches so  Avoid if possible  Excedrin migraine  Except in  Needed circumstances   As rescue medicine .     Recurrent Migraine Headache Migraines are a type of headache, and they are usually stronger and more sudden than normal headaches (tension headaches). Migraines are characterized by an intense pulsing, throbbing pain that is usually only present on one side of the head. Sometimes, migraine headaches can cause nausea, vomiting, sensitivity to light and sound, and vision changes. Recurrent migraines keep coming back (recurring). A migraine can last from 4 hours up to 3 days. What are the causes? The exact cause of this condition is not known. However, a migraine may be caused when nerves in the brain become irritated and release chemicals that cause inflammation of blood vessels. This inflammation causes pain. Certain things may also trigger migraines, such as:  A disruption in your regular eating and sleeping schedule.  Smoking.  Stress.  Menstruation.  Certain foods and drinks, such as: ? Aged cheese. ? Chocolate. ? Alcohol. ? Caffeine. ? Foods or drinks that contain nitrates, glutamate, aspartame, MSG, or tyramine.  Lack of sleep.  Hunger.  Physical exertion.  Fatigue.  High altitude.  Weather changes.  Medicines, such as: ? Nitroglycerin, which is used to treat chest pain. ? Birth control pills. ? Estrogen. ? Some blood pressure medicines.  What are the signs or symptoms? Symptoms of this condition vary for each person and may include:  Pain that is usually only present on one side of the head. In some cases, the pain may be on both sides of the head or around the head or neck.  Pulsating or throbbing pain.  Severe  pain that prevents daily activities.  Pain that is aggravated by any physical activity.  Nausea, vomiting, or both.  Dizziness.  Pain with exposure to bright lights, loud noises, or activity.  General sensitivity to bright lights, loud noises, or smells.  Before you get a migraine, you may get warning signs that a migraine is coming (aura). An aura may include:  Seeing flashing lights.  Seeing bright spots, halos, or zigzag lines.  Having tunnel vision or blurred vision.  Having numbness or a tingling feeling.  Having trouble talking.  Having muscle weakness.  Smelling a certain odor.  How is this diagnosed? This condition is often diagnosed based on:  Your symptoms and medical history.  A physical exam.  You may also have tests, including:  A CT scan or MRI of your brain. These imaging tests cannot diagnose migraines, but they can help to rule out other causes of headaches.  Blood tests.  How is this treated? This condition is treated with:  Medicines. These are used for: ? Lessening pain and nausea. ? Preventing recurrent migraines.  Lifestyle changes, such as changes to your diet or sleeping patterns.  Behavior therapy, such as relaxation training or biofeedback. Biofeedback is a treatment that involves teaching you to relax and use your brain to lower your heart rate and control your breathing.  Follow these instructions at home: Medicines  Take over-the-counter and prescription medicines only as told by your health care provider.  Do not drive or use  heavy machinery while taking prescription pain medicine. Lifestyle  Do not use any products that contain nicotine or tobacco, such as cigarettes and e-cigarettes. If you need help quitting, ask your health care provider.  Limit alcohol intake to no more than 1 drink a day for nonpregnant women and 2 drinks a day for men. One drink equals 12 oz of beer, 5 oz of wine, or 1 oz of hard liquor.  Get 7-9  hours of sleep each night, or the amount of sleep recommended by your health care provider.  Limit your stress. Talk with your health care provider if you need help with stress management.  Maintain a healthy weight. If you need help losing weight, ask your health care provider.  Exercise regularly. Aim for 150 minutes of moderate-intensity exercise (walking, biking, yoga) or 75 minutes of vigorous exercise (running, circuit training, swimming) each week. General instructions  Keep a journal to find out what triggers your migraine headaches so you can avoid these triggers. For example, write down: ? What you eat and drink. ? How much sleep you get. ? Any change to your diet or medicines.  Lie down in a dark, quiet room when you have a migraine.  Try placing a cool towel over your head when you have a migraine.  Keep lights dim, if bright lights bother you and make your migraines worse.  Keep all follow-up visits as told by your health care provider. This is important. Contact a health care provider if:  Your pain does not improve, even with medicine.  Your migraines continue to return, even with medicine.  You have a fever.  You have weight loss. Get help right away if:  Your migraine becomes severe and medicine does not help.  You have a stiff neck.  You have a loss of vision.  You have muscle weakness or loss of muscle control.  You start losing your balance or have trouble walking.  You feel faint or you pass out.  You develop new, severe symptoms.  You start having abrupt severe headaches that last for a second or less, like a thunderclap. Summary  Migraine headaches are usually stronger and more sudden than normal headaches (tension headaches). Migraines are characterized by an intense pulsing, throbbing pain that is usually only present on one side of the head.  The exact cause of this condition is not known. However, a migraine may be caused when nerves in  the brain become irritated and release chemicals that cause inflammation of blood vessels.  Certain things may trigger migraines, such as changes to diet or sleeping patterns, smoking, certain foods, alcohol, stress, and certain medicines.  Sometimes, migraine headaches can cause nausea, vomiting, sensitivity to light and sound, and vision changes.  Migraines are often diagnosed based on your symptoms, medical history, and a physical exam. This information is not intended to replace advice given to you by your health care provider. Make sure you discuss any questions you have with your health care provider. Document Released: 01/23/2001 Document Revised: 02/10/2016 Document Reviewed: 02/10/2016 Elsevier Interactive Patient Education  [employer].

## 2017-10-01 ENCOUNTER — Telehealth: Payer: Self-pay | Admitting: Internal Medicine

## 2017-10-01 NOTE — Telephone Encounter (Signed)
Copied from [place] [id number]. Topic: Quick Communication - Rx Refill/Question >> Oct 01, 2017  2:12 PM Synthia Innocent wrote: Medication: SUMAtriptan (IMITREX) 100 MG tablet  Has the patient contacted their pharmacy? Yes.  Pharmacy only filled 66m last month (Agent: If no, request that the patient contact the pharmacy for the refill.) (Agent: If yes, when and what did the pharmacy advise?)  Preferred Pharmacy (with phone number or street name): [facility] [address] Please be advised that RX refills may take up to 3 business days. We ask that you follow-up with your pharmacy.

## 2017-10-02 MED ORDER — SUMATRIPTAN SUCCINATE 100 MG PO TABS: ORAL_TABLET | ORAL | 3 refills | Status: DC

## 2017-10-15 ENCOUNTER — Ambulatory Visit: Payer: 59 | Admitting: Gastroenterology

## 2017-10-19 ENCOUNTER — Other Ambulatory Visit: Payer: Self-pay | Admitting: Family Medicine

## 2017-10-19 DIAGNOSIS — Z8669 Personal history of other diseases of the nervous system and sense organs: Secondary | ICD-10-CM

## 2017-10-21 NOTE — Telephone Encounter (Signed)
Dr Regis Bill pt

## 2017-10-29 ENCOUNTER — Ambulatory Visit: Payer: Self-pay | Admitting: Internal Medicine

## 2017-10-29 ENCOUNTER — Ambulatory Visit (INDEPENDENT_AMBULATORY_CARE_PROVIDER_SITE_OTHER): Payer: 59 | Admitting: Family Medicine

## 2017-10-29 ENCOUNTER — Encounter: Payer: Self-pay | Admitting: Family Medicine

## 2017-10-29 VITALS — BP 110/80 | HR 72 | Temp 98.2°F | Wt 175.0 lb

## 2017-10-29 DIAGNOSIS — L299 Pruritus, unspecified: Secondary | ICD-10-CM | POA: Diagnosis not present

## 2017-10-29 DIAGNOSIS — G43909 Migraine, unspecified, not intractable, without status migrainosus: Secondary | ICD-10-CM

## 2017-10-29 DIAGNOSIS — R079 Chest pain, unspecified: Secondary | ICD-10-CM

## 2017-10-29 MED ORDER — SUMATRIPTAN SUCCINATE 100 MG PO TABS: ORAL_TABLET | ORAL | 3 refills | Status: DC

## 2017-10-29 NOTE — Patient Instructions (Signed)
Nonspecific Chest Pain °Chest pain can be caused by many different conditions. There is always a chance that your pain could be related to something serious, such as a heart attack or a blood clot in your lungs. Chest pain can also be caused by conditions that are not life-threatening. If you have chest pain, it is very important to follow up with your health care provider. °What are the causes? °Causes of this condition include: °· Heartburn. °· Pneumonia or bronchitis. °· Anxiety or stress. °· Inflammation around your heart (pericarditis) or lung (pleuritis or pleurisy). °· A blood clot in your lung. °· A collapsed lung (pneumothorax). This can develop suddenly on its own (spontaneous pneumothorax) or from trauma to the chest. °· Shingles infection (varicella-zoster virus). °· Heart attack. °· Damage to the bones, muscles, and cartilage that make up your chest wall. This can include: °? Bruised bones due to injury. °? Strained muscles or cartilage due to frequent or repeated coughing or overwork. °? Fracture to one or more ribs. °? Sore cartilage due to inflammation (costochondritis). ° °What increases the risk? °Risk factors for this condition may include: °· Activities that increase your risk for trauma or injury to your chest. °· Respiratory infections or conditions that cause frequent coughing. °· Medical conditions or overeating that can cause heartburn. °· Heart disease or family history of heart disease. °· Conditions or health behaviors that increase your risk of developing a blood clot. °· Having had chicken pox (varicella zoster). ° °What are the signs or symptoms? °Chest pain can feel like: °· Burning or tingling on the surface of your chest or deep in your chest. °· Crushing, pressure, aching, or squeezing pain. °· Dull or sharp pain that is worse when you move, cough, or take a deep breath. °· Pain that is also felt in your back, neck, shoulder, or arm, or pain that spreads to any of these  areas. ° °Your chest pain may come and go, or it may stay constant. °How is this diagnosed? °Lab tests or other studies may be needed to find the cause of your pain. Your health care provider may have you take a test called an ECG (electrocardiogram). An ECG records your heartbeat patterns at the time the test is performed. You may also have other tests, such as: °· Transthoracic echocardiogram (TTE). In this test, sound waves are used to create a picture of the heart structures and to look at how blood flows through your heart. °· Transesophageal echocardiogram (TEE). This is a more advanced imaging test that takes images from inside your body. It allows your health care provider to see your heart in finer detail. °· Cardiac monitoring. This allows your health care provider to monitor your heart rate and rhythm in real time. °· Holter monitor. This is a portable device that records your heartbeat and can help to diagnose abnormal heartbeats. It allows your health care provider to track your heart activity for several days, if needed. °· Stress tests. These can be done through exercise or by taking medicine that makes your heart beat more quickly. °· Blood tests. °· Other imaging tests. ° °How is this treated? °Treatment depends on what is causing your chest pain. Treatment may include: °· Medicines. These may include: °? Acid blockers for heartburn. °? Anti-inflammatory medicine. °? Pain medicine for inflammatory conditions. °? Antibiotic medicine, if an infection is present. °? Medicines to dissolve blood clots. °? Medicines to treat coronary artery disease (CAD). °· Supportive care for conditions that   do not require medicines. This may include: °? Resting. °? Applying heat or cold packs to injured areas. °? Limiting activities until pain decreases. ° °Follow these instructions at home: °Medicines °· If you were prescribed an antibiotic, take it as told by your health care provider. Do not stop taking the  antibiotic even if you start to feel better. °· Take over-the-counter and prescription medicines only as told by your health care provider. °Lifestyle °· Do not use any products that contain nicotine or tobacco, such as cigarettes and e-cigarettes. If you need help quitting, ask your health care provider. °· Do not drink alcohol. °· Make lifestyle changes as directed by your health care provider. These may include: °? Getting regular exercise. Ask your health care provider to suggest some activities that are safe for you. °? Eating a heart-healthy diet. A registered dietitian can help you to learn healthy eating options. °? Maintaining a healthy weight. °? Managing diabetes, if necessary. °? Reducing stress, such as with yoga or relaxation techniques. °General instructions °· Avoid any activities that bring on chest pain. °· If heartburn is the cause for your chest pain, raise (elevate) the head of your bed about 6 inches (15 cm) by putting blocks under the legs. Sleeping with more pillows does not effectively relieve heartburn because it only changes the position of your head. °· Keep all follow-up visits as told by your health care provider. This is important. This includes any further testing if your chest pain does not go away. °Contact a health care provider if: °· Your chest pain does not go away. °· You have a rash with blisters on your chest. °· You have a fever. °· You have chills. °Get help right away if: °· Your chest pain is worse. °· You have a cough that gets worse, or you cough up blood. °· You have severe pain in your abdomen. °· You have severe weakness. °· You faint. °· You have sudden, unexplained chest discomfort. °· You have sudden, unexplained discomfort in your arms, back, neck, or jaw. °· You have shortness of breath at any time. °· You suddenly start to sweat, or your skin gets clammy. °· You feel nauseous or you vomit. °· You suddenly feel light-headed or dizzy. °· Your heart begins to beat  quickly, or it feels like it is skipping beats. °These symptoms may represent a serious problem that is an emergency. Do not wait to see if the symptoms will go away. Get medical help right away. Call your local emergency services (911 in the [place] Do not drive yourself to the hospital. °This information is not intended to replace advice given to you by your health care provider. Make sure you discuss any questions you have with your health care provider. °Document Released: 02/07/2005 Document Revised: 01/23/2016 Document Reviewed: 01/23/2016 °Elsevier Interactive Patient Education © 2017 [employer] ° °

## 2017-10-29 NOTE — Progress Notes (Signed)
Subjective:    Patient ID: Cheryl Walters, female    DOB: [birth year], 27 y.o.   MRN: [id number]  No chief complaint on file.   HPI Patient was seen today for acute concern.  Pt endorses CP that comes and goes.  CP started at work when she bent over to put shoes away. Dull pain in the middle of her chest, was worse with movement, slight dizziness during initial episode.  Since then pt endorses one other episode of moderate CP and HA today.  Pt took Tylenol extra strength.  Pt denies radiation, N/V, SOB, fever, h/o heart burn, heavy lifting (other than her son, 25lbs).  Pt states her job is low stress.  Pt also endorses pruritus all over.  Pt states this happens every year during the summer.  Pt typically uses cocoa butter lotion.  She denies any changes in soaps, lotions, or detergents.  She has not tried anything else for this.  Past Medical History:  Diagnosis Date  . Acne   . Active preterm labor 10/23/2016  . Headache(784.0)   . Irregular periods   . Pilonidal cyst     No Known Allergies  ROS General: Denies fever, chills, night sweats, changes in weight, changes in appetite  + dizziness HEENT: Denies ear pain, changes in vision, rhinorrhea, sore throat  + headache CV: Denies palpitations, SOB, orthopnea  + chest pain Pulm: Denies SOB, cough, wheezing GI: Denies abdominal pain, nausea, vomiting, diarrhea, constipation GU: Denies dysuria, hematuria, frequency, vaginal discharge Msk: Denies muscle cramps, joint pains Neuro: Denies weakness, numbness, tingling Skin: Denies rashes, bruising  + pruritus Psych: Denies depression, anxiety, hallucinations     Objective:    Blood pressure 110/80, pulse 72, temperature 98.2 F (36.8 C), temperature source Oral, weight 175 lb (79.4 kg), SpO2 98 %, not currently breastfeeding.   Gen. Pleasant, well-nourished, in no distress, normal affect   HEENT: [place]/AT, face symmetric,no scleral icterus, PERRLA, nares patent without drainage,  pharynx without erythema or exudate. Lungs: no accessory muscle use, CTAB, no wheezes or rales Cardiovascular: RRR, no m/r/g, no peripheral edema.  No TTP of chest wall Neuro:  A&Ox3, CN II-XII intact, normal gait Skin:  Warm, no lesions/ rash   Wt Readings from Last 3 Encounters:  10/29/17 175 lb (79.4 kg)  09/04/17 180 lb 3.2 oz (81.7 kg)  08/01/17 184 lb (83.5 kg)    Lab Results  Component Value Date   WBC 21.1 (H) 10/24/2016   HGB 11.0 (L) 10/24/2016   HCT 32.6 (L) 10/24/2016   PLT 284 10/24/2016   GLUCOSE 100 (H) 06/11/2014   CHOL 192 12/03/2007   TRIG 37 12/03/2007   HDL 83.2 12/03/2007   LDLCALC 101 (H) 12/03/2007   ALT 12 06/11/2014   AST 15 06/11/2014   NA 140 06/11/2014   K 4.5 06/11/2014   CL 106 06/11/2014   CREATININE 0.82 06/11/2014   BUN 8 06/11/2014   CO2 26 06/11/2014   TSH 2.502 06/11/2014   HGBA1C 5.3 06/11/2014    Assessment/Plan:  Chest pain, unspecified type  -Discussed possible causes, risk factors reviewed. -Patient reassured.  More likely musculoskeletal versus cardiac given vital signs and physical exam. - Plan: EKG 12-Lead.  EKG compared to prior studies.  EKG this visit with sinus rhythm, bradycardia- HR 56, lead III with low voltage and nonspecific t wave change, cannot r/o ischemia. -Okay to take Tylenol as needed -Discussed reducing stress, staying hydrated. -Patient to follow-up next week.  Has appointment with  PCP.  Migraine without status migrainosus, not intractable, unspecified migraine type  - Plan: SUMAtriptan (IMITREX) 100 MG tablet  Pruritus -Advised to try OTC antihistamine  Patient given RTC or ED precautions for chest pain.  Will follow-up next week  Grier Mitts, MD

## 2017-10-29 NOTE — Telephone Encounter (Signed)
Patient reports symptoms of chest pain aggravated by movement and noticed when she bent over.Patient states she has a headache as well . Patient denies any shortness of breath or sweating . Same day appt made for today with Grier Mitts.Patient was advised to call back if symptoms are worse.  Reason for Disposition . [1] Chest pain lasting <= 5 minutes AND [2] NO chest pain or cardiac symptoms now(Exceptions: pains lasting a few seconds)  Answer Assessment - Initial Assessment Questions 1. LOCATION: "Where does it hurt?"         Middle   - Started after bending over over putting shoes up  2. RADIATION: "Does the pain go anywhere else?" (e.g., into neck, jaw, arms, back)         No 3. ONSET: "When did the chest pain begin?" (Minutes, hours or days)         Last night   4. PATTERN "Does the pain come and go, or has it been constant since it started?"  "Does it get worse with exertion?"           Comes and  Goes   5. DURATION: "How long does it last" (e.g., seconds, minutes, hours)           Seconds now  Lasts about 1 hour   6. SEVERITY: "How bad is the pain?"  (e.g., Scale 1-10; mild, moderate, or severe)    - MILD (1-3): doesn't interfere with normal activities     - MODERATE (4-7): interferes with normal activities or awakens from sleep    - SEVERE (8-10): excruciating pain, unable to do any normal activities          4   7. CARDIAC RISK FACTORS: "Do you have any history of heart problems or risk factors for heart disease?" (e.g., prior heart attack, angina; high blood pressure, diabetes, being overweight, high cholesterol, smoking, or strong family history of heart disease)     no 8. PULMONARY RISK FACTORS: "Do you have any history of lung disease?"  (e.g., blood clots in lung, asthma, emphysema, birth control pills)        No 9. CAUSE: "What do you think is causing the chest pain?"       Movement  10. OTHER SYMPTOMS: "Do you have any other symptoms?" (e.g., dizziness, nausea, vomiting,  sweating, fever, difficulty breathing, cough)          no 11. PREGNANCY: "Is there any chance you are pregnant?" "When was your last menstrual period?"           IUD   IRREG  Protocols used: CHEST PAIN-A-AH

## 2017-11-06 ENCOUNTER — Ambulatory Visit: Payer: 59 | Admitting: Internal Medicine

## 2017-11-07 ENCOUNTER — Telehealth: Payer: Self-pay | Admitting: Internal Medicine

## 2017-11-07 ENCOUNTER — Ambulatory Visit (INDEPENDENT_AMBULATORY_CARE_PROVIDER_SITE_OTHER): Payer: 59 | Admitting: Internal Medicine

## 2017-11-07 ENCOUNTER — Encounter: Payer: Self-pay | Admitting: Internal Medicine

## 2017-11-07 VITALS — BP 114/72 | HR 88 | Temp 98.0°F | Ht 60.0 in | Wt 177.1 lb

## 2017-11-07 DIAGNOSIS — Z79899 Other long term (current) drug therapy: Secondary | ICD-10-CM | POA: Diagnosis not present

## 2017-11-07 DIAGNOSIS — R51 Headache: Secondary | ICD-10-CM

## 2017-11-07 DIAGNOSIS — Z87898 Personal history of other specified conditions: Secondary | ICD-10-CM | POA: Diagnosis not present

## 2017-11-07 DIAGNOSIS — G43909 Migraine, unspecified, not intractable, without status migrainosus: Secondary | ICD-10-CM

## 2017-11-07 DIAGNOSIS — R519 Headache, unspecified: Secondary | ICD-10-CM

## 2017-11-07 NOTE — Progress Notes (Signed)
Chief Complaint  Patient presents with  . Follow-up    2 month f/u    HPI: Rudell Cobb 27 y.o. come in for fu HA and  Cp meds etc t  Here with  29 year old   wcc   FU headaches : inc imitrex to 100 and told to take 2 aleve at onset of ha  Not as bad  dont last long now  Taking aleve  And 2 per 2 weeks   Taking med and not lasting long.   .hasnt been taking  100 mg imitrex until recently   In interim had atypical motion  aggragated cp see  6 18 note    w nl ekg   An not assoc with triptan.... No recurrence now and no assoc sx  Onset was at work . No exercise intolerance  Had 8 mos old very active  Larger toddler lifint but no injury .  ROS: See pertinent positives and negatives per HPI.  Past Medical History:  Diagnosis Date  . Acne   . Active preterm labor 10/23/2016  . Headache(784.0)   . Irregular periods   . Pilonidal cyst     Family History  Problem Relation Age of Onset  . Other Mother        irreg periods  . Other Sister        irreg periods  . Sleep apnea Father        on machine  . Breast cancer Maternal Aunt   . Brain cancer Paternal Aunt   . Colon cancer Neg Hx   . Throat cancer Neg Hx   . Kidney disease Neg Hx   . Liver disease Neg Hx   . Diabetes Neg Hx   . Heart disease Neg Hx   . Stomach cancer Neg Hx     Social History   Socioeconomic History  . Marital status: Married    Spouse name: Not on file  . Number of children: Not on file  . Years of education: Not on file  . Highest education level: Not on file  Occupational History  . Not on file  Social Needs  . Financial resource strain: Not on file  . Food insecurity:    Worry: Not on file    Inability: Not on file  . Transportation needs:    Medical: Not on file    Non-medical: Not on file  Tobacco Use  . Smoking status: Never Smoker  . Smokeless tobacco: Never Used  Substance and Sexual Activity  . Alcohol use: Yes    Alcohol/week: 0.0 oz    Comment: 3 every other week  .  Drug use: No  . Sexual activity: Not on file  Lifestyle  . Physical activity:    Days per week: Not on file    Minutes per session: Not on file  . Stress: Not on file  Relationships  . Social connections:    Talks on phone: Not on file    Gets together: Not on file    Attends religious service: Not on file    Active member of club or organization: Not on file    Attends meetings of clubs or organizations: Not on file    Relationship status: Not on file  Other Topics Concern  . 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 SUMAtriptan (IMITREX) 100 MG tablet Take one at onset of  Headache May repeat in 2 hours if headache persists or recurs. 10 tablet 3   No facility-administered medications prior to visit.      EXAM:  BP 114/72 (BP Location: Left Arm, Patient Position: Sitting, Cuff Size: Normal)   Pulse 88   Temp 98 F (36.7 C) (Oral)   Ht 5' (1.524 m)   Wt 177 lb 2 oz (80.3 kg)   SpO2 98%   BMI 34.59 kg/m   Body mass index is 34.59 kg/m.  GENERAL: vitals reviewed and listed above, alert, oriented, appears well hydrated and in no acute distress HEENT: atraumatic, conjunctiva  clear, no obvious abnormalities on inspection of external nose and ears OP : no lesion edema or exudate  NECK: no obvious masses on inspection palpation  LUNGS: clear to auscultation bilaterally, no wheezes, rales or rhonchi, good air movement CV: HRRR, no clubbing cyanosis or  peripheral edema nl cap  refill  MS: moves all extremities without noticeable focal  abnormality PSYCH: pleasant and cooperative, no obvious depression or anxiety  BP Readings from Last 3 Encounters:  11/07/17 114/72  10/29/17 110/80  09/04/17 114/76   Wt Readings from Last 3 Encounters:  11/07/17 177 lb 2 oz (80.3 kg)  10/29/17 175 lb (79.4 kg)  09/04/17 180 lb 3.2 oz (81.7 kg)     ASSESSMENT AND PLAN:  Discussed the following assessment and plan:  Nonintractable headache, unspecified chronicity pattern, unspecified headache type  History of chest pain  Medication management Improved and no alarm sx  She is not sure of triggers     Will follow  For now  Cp seems atypical and not cv  Will follow   reviewed findings  consdieration ofa typical  Ms? -Patient advised to return or notify health care team  if  new concerns arise.  Patient Instructions  Glad you are doing better   Continue to track headaches and  Optimize sleep  Hydration  Etc.   If  Progressing   Then recheck  .   And consider seeing   Headache specialist    Chest pain dosent sound cardiac   At this time  . If   persistent or progressive   Then plan follow up  Other common causes   Chest wall  And esophageal  Discomfort that usually goes away with time.    Standley Brooking. Ciena Sampley M.D.

## 2017-11-07 NOTE — Telephone Encounter (Signed)
Copied from [place] [id number]. Topic: Quick Communication - Rx Refill/Question >> Nov 07, 2017 12:41 PM Percell Belt A wrote: Medication: SUMAtriptan (IMITREX) 100 MG tablet [[id number]] - pharmacy told her that they did not get it on 6/18   Has the patient contacted their pharmacy? No  (Agent: If no, request that the patient contact the pharmacy for the refill.) (Agent: If yes, when and what did the pharmacy advise?)  Preferred Pharmacy (with phone number or street name): [facility] on [address]   Agent: Please be advised that RX refills may take up to 3 business days. We ask that you follow-up with your pharmacy.

## 2017-11-07 NOTE — Patient Instructions (Addendum)
Glad you are doing better   Continue to track headaches and  Optimize sleep  Hydration  Etc.   If  Progressing   Then recheck  .   And consider seeing   Headache specialist    Chest pain dosent sound cardiac   At this time  . If   persistent or progressive   Then plan follow up  Other common causes   Chest wall  And esophageal  Discomfort that usually goes away with time.

## 2017-11-08 MED ORDER — SUMATRIPTAN SUCCINATE 100 MG PO TABS: ORAL_TABLET | ORAL | 3 refills | Status: DC

## 2017-11-08 NOTE — Telephone Encounter (Signed)
Medication resent to pharmacy.

## 2018-02-04 NOTE — Progress Notes (Signed)
Chief Complaint  Patient presents with  . Headache    constant headaches for several years - worsening. pt has medication she uses for her headaches and this is no longer helping. Denies N/V or distorted vision.    HPI: Cheryl Walters 27 y.o. come in for   SDA for continued problems with headaches. Hs of recurrent headaches   Seen  June and had been doing better    On imitrex as needed  However over the last month or so she is having increasing headaches the medicine keeps it from going into a full migraine but this past week is having headache most days.  Gets photophobia but no vomiting neurologic findings or aura.  For a week   And every day    Not turning into migraine.  Not working.2  Aleve works better.   Then ibuprofen   Last month perhaps only 5 days where she did not take any medicine at all.  Her mom has a history of headaches and migraines and sees Dr. Tommi Rumps at the headache center.  Her mom is on Topamax  ROS: See pertinent positives and negatives per HPI.  No chest pain shortness of breath fever neurologic signs states she feels well except for headaches.  No history of sleep apnea.  Hs IUD  minimal to no periods   Past Medical History:  Diagnosis Date  . Acne   . Active preterm labor 10/23/2016  . Headache(784.0)   . Irregular periods   . Pilonidal cyst     Family History  Problem Relation Age of Onset  . Other Mother        irreg periods  . Other Sister        irreg periods  . Sleep apnea Father        on machine  . Breast cancer Maternal Aunt   . Brain cancer Paternal Aunt   . Colon cancer Neg Hx   . Throat cancer Neg Hx   . Kidney disease Neg Hx   . Liver disease Neg Hx   . Diabetes Neg Hx   . Heart disease Neg Hx   . Stomach cancer Neg Hx     Social History   Socioeconomic History  . Marital status: Married    Spouse name: Not on file  . Number of children: Not on file  . Years of education: Not on file  . Highest education level: Not on  file  Occupational History  . Not on file  Social Needs  . Financial resource strain: Not on file  . Food insecurity:    Worry: Not on file    Inability: Not on file  . Transportation needs:    Medical: Not on file    Non-medical: Not on file  Tobacco Use  . Smoking status: Never Smoker  . Smokeless tobacco: Never Used  Substance and Sexual Activity  . Alcohol use: Yes    Alcohol/week: 0.0 standard drinks    Comment: 3 every other week  . Drug use: No  . Sexual activity: Not on file  Lifestyle  . Physical activity:    Days per week: Not on file    Minutes per session: Not on file  . Stress: Not on file  Relationships  . Social connections:    Talks on phone: Not on file    Gets together: Not on file    Attends religious service: Not on file    Active member of club or organization:  Not on file    Attends meetings of clubs or organizations: Not on file    Relationship status: Not on file  Other Topics Concern  . 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 SUMAtriptan (IMITREX) 100 MG tablet Take one at onset of  Headache May repeat in 2 hours if headache persists or recurs. 10 tablet 3   No facility-administered medications prior to visit.      EXAM:  BP 108/62 (BP Location: Right Arm, Patient Position: Sitting, Cuff Size: Normal)   Pulse 64   Temp 98.2 F (36.8 C) (Oral)   Wt 173 lb 12.8 oz (78.8 kg)   BMI 33.94 kg/m   Body mass index is 33.94 kg/m.  GENERAL:  vitals reviewed and listed above, alert, oriented, appears well hydrated and in no acute distress HEENT: atraumatic, conjunctiva  clear, no obvious abnormalities on inspection of external nose and ears tmx clear nl facial symmetry eoms nl perrlOP : no lesion edema or exudate  NECK: no obvious masses on inspection palpation  LUNGS: clear to auscultation bilaterally, no wheezes, rales or rhonchi, good air movement CV: HRRR, no clubbing cyanosis or  peripheral edema nl cap refill  MS: moves all extremities without noticeable focal  Abnormality Neurologic appears nonfocal intact DTRs are equal. PSYCH: pleasant and cooperative, no obvious depression or anxiety  BP Readings from Last 3 Encounters:  02/05/18 108/62  11/07/17 114/72  10/29/17 110/80   Wt Readings from Last 3 Encounters:  02/05/18 173 lb 12.8 oz (78.8 kg)  11/07/17 177 lb 2 oz (80.3 kg)  10/29/17 175 lb (79.4 kg)    ASSESSMENT AND PLAN:  Discussed the following assessment and plan:  Recurrent headache  Migraine without status migrainosus, not intractable, unspecified migraine type - Plan: AMB referral to headache clinic  Medication management - Plan: AMB referral to headache clinic  Influenza vaccination declined Ongoing headaches recent worsening uncertain triggers concerned about rebound headaches and transitioning into a chronic headaches date. Discussed prevention and referral to headache specialist.  Of note her mom has problems with headaches also. Begin Topamax 25 mg at night increased to 1:50 week and goal 50 twice daily. In the interim calendar headache medicines discussed trying to avoid rebound headaches.  We will do a referral to the headache center and wellness.  For further help with management.  Follow-up with alarm symptoms in the interim.  If needed.   Expectant management. Medication discussed   -Patient advised to return or notify health care team  if  new concerns arise.  Patient Instructions    Track headaches and  How often you have to take medications Begin  Controller meds for now . Will be contacted about  Headache referral  for fu .        Standley Brooking. Panosh M.D.

## 2018-02-05 ENCOUNTER — Ambulatory Visit (INDEPENDENT_AMBULATORY_CARE_PROVIDER_SITE_OTHER): Payer: 59 | Admitting: Internal Medicine

## 2018-02-05 ENCOUNTER — Encounter: Payer: Self-pay | Admitting: Internal Medicine

## 2018-02-05 VITALS — BP 108/62 | HR 64 | Temp 98.2°F | Wt 173.8 lb

## 2018-02-05 DIAGNOSIS — R51 Headache: Secondary | ICD-10-CM

## 2018-02-05 DIAGNOSIS — R519 Headache, unspecified: Secondary | ICD-10-CM

## 2018-02-05 DIAGNOSIS — Z2821 Immunization not carried out because of patient refusal: Secondary | ICD-10-CM

## 2018-02-05 DIAGNOSIS — G43909 Migraine, unspecified, not intractable, without status migrainosus: Secondary | ICD-10-CM

## 2018-02-05 DIAGNOSIS — Z79899 Other long term (current) drug therapy: Secondary | ICD-10-CM | POA: Diagnosis not present

## 2018-02-05 MED ORDER — TOPIRAMATE 50 MG PO TABS: ORAL_TABLET | ORAL | 1 refills | Status: DC

## 2018-02-05 NOTE — Patient Instructions (Addendum)
  Track headaches and  How often you have to take medications Begin  Controller meds for now . Will be contacted about  Headache referral for fu .

## 2018-04-18 ENCOUNTER — Ambulatory Visit (INDEPENDENT_AMBULATORY_CARE_PROVIDER_SITE_OTHER): Payer: 59 | Admitting: Physician Assistant

## 2018-04-18 ENCOUNTER — Other Ambulatory Visit: Payer: Self-pay

## 2018-04-18 ENCOUNTER — Other Ambulatory Visit (INDEPENDENT_AMBULATORY_CARE_PROVIDER_SITE_OTHER): Payer: 59

## 2018-04-18 ENCOUNTER — Encounter: Payer: Self-pay | Admitting: Physician Assistant

## 2018-04-18 VITALS — BP 98/60 | HR 82 | Ht 60.0 in | Wt 174.0 lb

## 2018-04-18 DIAGNOSIS — K51018 Ulcerative (chronic) pancolitis with other complication: Secondary | ICD-10-CM

## 2018-04-18 LAB — COMPREHENSIVE METABOLIC PANEL
ALBUMIN: 4.1 g/dL (ref 3.5–5.2)
ALK PHOS: 61 U/L (ref 39–117)
ALT: 13 U/L (ref 0–35)
AST: 14 U/L (ref 0–37)
BUN: 9 mg/dL (ref 6–23)
CALCIUM: 8.9 mg/dL (ref 8.4–10.5)
CO2: 27 mEq/L (ref 19–32)
CREATININE: 0.82 mg/dL (ref 0.40–1.20)
Chloride: 107 mEq/L (ref 96–112)
GFR: 87.39 mL/min (ref >60.00)
Glucose, Bld: 93 mg/dL (ref 70–99)
Potassium: 4.4 mEq/L (ref 3.5–5.1)
SODIUM: 139 meq/L (ref 135–145)
TOTAL PROTEIN: 7.7 g/dL (ref 6.0–8.3)
Total Bilirubin: 0.5 mg/dL (ref 0.2–1.2)

## 2018-04-18 LAB — CBC WITH DIFFERENTIAL/PLATELET
Basophils Absolute: 0.1 10*3/uL (ref 0.0–0.1)
Basophils Relative: 0.9 % (ref 0.0–3.0)
EOS ABS: 0.3 10*3/uL (ref 0.0–0.7)
Eosinophils Relative: 3.7 % (ref 0.0–5.0)
HCT: 41.2 % (ref 36.0–46.0)
HEMOGLOBIN: 13.6 g/dL (ref 12.0–15.0)
LYMPHS PCT: 37.4 % (ref 12.0–46.0)
Lymphs Abs: 3.1 10*3/uL (ref 0.7–4.0)
MCHC: 32.9 g/dL (ref 30.0–36.0)
MCV: 83.5 fl (ref 78.0–100.0)
MONO ABS: 0.5 10*3/uL (ref 0.1–1.0)
Monocytes Relative: 6.3 % (ref 3.0–12.0)
NEUTROS PCT: 51.7 % (ref 43.0–77.0)
Neutro Abs: 4.3 10*3/uL (ref 1.4–7.7)
Platelets: 320 10*3/uL (ref 150.0–400.0)
RBC: 4.94 Mil/uL (ref 3.87–5.11)
RDW: 13.7 % (ref 11.5–15.5)
WBC: 8.2 10*3/uL (ref 4.0–10.5)

## 2018-04-18 MED ORDER — HYDROCORTISONE ACETATE 25 MG RE SUPP: 25.0000 mg | Freq: Two times a day (BID) | RECTAL | 0 refills | Status: DC

## 2018-04-18 MED ORDER — MESALAMINE 1.2 G PO TBEC: 4.8000 g | DELAYED_RELEASE_TABLET | Freq: Every day | ORAL | 3 refills | Status: DC

## 2018-04-18 NOTE — Progress Notes (Signed)
Chief Complaint: Follow-up ulcerative proctitis  HPI:    Cheryl Walters is a 27 year old African-American female with a past medical history as listed below, assigned to Dr. 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    08/01/2017 patient seen in clinic.  At that time previous colonoscopy noted March 2016 with findings of proctitis from 0-5 cm consistent with ulcerative proctitis with edematous friable mucosal granularity.  Biopsy showed chronic active inflammation consistent with IBD and she was given Anusol HC suppositories.  At time of follow-up patient had continued with some rectal bleeding and was prescribed Canasa suppositories at bedtime.  When she saw me at that visit the patient told me she could not afford the Canasa suppositories and continued with some rectal bleeding as well as tenesmus and passage of small amounts of mucus and blood in her stool.  She describes a change in insurance and Canasa suppositories were prescribed again.    Today, patient presents clinic and explains that her Canasa suppositories were still $800 a month and so she never got them.  She has been continuing with the same symptoms as before with bloody bowel movement sometimes 1-2 times a day.  Sometimes these are a 5/7 on the [facility] scale and other times slightly looser.  Patient denies any abdominal pain or new symptoms over the past few months.    Denies fever, chills, weight loss, ataxia, nausea, vomiting or symptoms that awaken her from sleep.  Past Medical History:  Diagnosis Date  . Acne   . Active preterm labor 10/23/2016  . Headache(784.0)   . Irregular periods   . Pilonidal cyst     Past Surgical History:  Procedure Laterality Date  . BIRTH CONTROL IMPLANT    . CESAREAN SECTION      Current Outpatient Medications  Medication Sig Dispense Refill  . SUMAtriptan (IMITREX) 100 MG tablet Take one at onset of  Headache May repeat  in 2 hours if headache persists or recurs. 10 tablet 3  . topiramate (TOPAMAX) 50 MG tablet 25 mg po hs  for 1 week , 50 mg for 1 week, increase to 50 mg po bid for headache suppression 60 tablet 1   No current facility-administered medications for this visit.     Allergies as of 04/18/2018  . (No Known Allergies)    Family History  Problem Relation Age of Onset  . Other Mother        irreg periods  . Other Sister        irreg periods  . Sleep apnea Father        on machine  . Breast cancer Maternal Aunt   . Brain cancer Paternal Aunt   . Colon cancer Neg Hx   . Throat cancer Neg Hx   . Kidney disease Neg Hx   . Liver disease Neg Hx   . Diabetes Neg Hx   . Heart disease Neg Hx   . Stomach cancer Neg Hx     Social History   Socioeconomic History  . Marital status: Married    Spouse name: Not on file  . Number of children: Not on file  . Years of education: Not on file  . Highest education level: Not on file  Occupational History  . Not on file  Social Needs  . Financial resource strain: Not on file  . Food insecurity:    Worry: Not on file    Inability:  Not on file  . Transportation needs:    Medical: Not on file    Non-medical: Not on file  Tobacco Use  . Smoking status: Never Smoker  . Smokeless tobacco: Never Used  Substance and Sexual Activity  . Alcohol use: Yes    Alcohol/week: 0.0 standard drinks    Comment: 3 every other week  . Drug use: No  . Sexual activity: Not on file  Lifestyle  . Physical activity:    Days per week: Not on file    Minutes per session: Not on file  . Stress: Not on file  Relationships  . Social connections:    Talks on phone: Not on file    Gets together: Not on file    Attends religious service: Not on file    Active member of club or organization: Not on file    Attends meetings of clubs or organizations: Not on file    Relationship status: Not on file  . Intimate partner violence:    Fear of current or ex partner:  Not on file    Emotionally abused: Not on file    Physically abused: Not on file    Forced sexual activity: Not on file  Other Topics Concern  . 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[employer]   Not working out of home at this time    NOw living iwht in [employer] [employer] and 4 years okd husband    Small quarters at home all day with 29 yo    To go to pre k if gets accepted  Neg  ocass etoh          Review of Systems:    Constitutional: No weight loss, fever or chills Cardiovascular: No chest pain Respiratory: No SOB Gastrointestinal: See HPI and otherwise negative   Physical Exam:  Vital signs: BP 98/60   Pulse 82   Ht 5' (1.524 m)   Wt 174 lb (78.9 kg)   BMI 33.98 kg/m   Constitutional:   Pleasant AA female appears to be in NAD, Well developed, Well nourished, alert and cooperative Respiratory: Respirations even and unlabored. Lungs clear to auscultation bilaterally.   No wheezes, crackles, or rhonchi.  Cardiovascular: Normal S1, S2. No MRG. Regular rate and rhythm. No peripheral edema, cyanosis or pallor.  Gastrointestinal:  Soft, nondistended, nontender. No rebound or guarding. Normal bowel sounds. No appreciable masses or hepatomegaly. Psychiatric:  Demonstrates good judgement and reason without abnormal affect or behaviors.  No recent labs  Assessment: 1. Ulcerative Proctitis: Patient still having symptoms of rectal bleeding and a change in bowel habits, she has not been on any therapy for this  Plan: 1.  Insurance does not cover Canasa suppositories 2.  Prescribed Lialda 4.8 g p.o. daily #120 with 3 refills 3.  Ordered a CBC and CMP for baseline 4.  Also prescribed Hydrocortisone suppositories twice daily x2 weeks #14 with 1 refill 5.  Patient does have  follow-up with Dr. Silverio Decamp in January already scheduled.  If she is no better at time of follow-up , she will need Prednisone   Cheryl Newer, PA-C [facility] 04/18/2018, 10:00  AM  Cc: Burnis Medin, MD

## 2018-04-18 NOTE — Patient Instructions (Addendum)
If you are age 29 or older, your body mass index should be between 23-30. Your Body mass index is 33.98 kg/m. If this is out of the aforementioned range listed, please consider follow up with your Primary Care Provider.  If you are age 30 or younger, your body mass index should be between 19-25. Your Body mass index is 33.98 kg/m. If this is out of the aformentioned range listed, please consider follow up with your Primary Care Provider.   Your provider has requested that you go to the basement level for lab work before leaving today. Press "B" on the elevator. The lab is located at the first door on the left as you exit the elevator.   We have sent the following medications to your pharmacy for you to pick up at your convenience: Lialda Hydrocortisone suppositories  Follow up with Dr. Silverio Decamp on June 10, 2018 at 8:45 am.  Thank you for choosing me and [facility].  Ellouise Newer, PA-C

## 2018-05-22 ENCOUNTER — Ambulatory Visit (INDEPENDENT_AMBULATORY_CARE_PROVIDER_SITE_OTHER): Payer: 59 | Admitting: Internal Medicine

## 2018-05-22 ENCOUNTER — Encounter: Payer: Self-pay | Admitting: Internal Medicine

## 2018-05-22 DIAGNOSIS — L0291 Cutaneous abscess, unspecified: Secondary | ICD-10-CM | POA: Diagnosis not present

## 2018-05-22 MED ORDER — SULFAMETHOXAZOLE-TRIMETHOPRIM 800-160 MG PO TABS: 1.0000 | ORAL_TABLET | Freq: Two times a day (BID) | ORAL | 0 refills | Status: DC

## 2018-05-22 MED ORDER — TRIAMCINOLONE ACETONIDE 0.1 % EX CREA: 1.0000 "application " | TOPICAL_CREAM | Freq: Two times a day (BID) | CUTANEOUS | 0 refills | Status: DC

## 2018-05-22 NOTE — Progress Notes (Signed)
   Subjective:   Patient ID: Cheryl Walters, female    DOB: [birth year], 28 y.o.   MRN: [id number]  HPI The patient is a 28 YO female coming in for pain and swelling in the upper buttock region. Has had cyst in the past and feels similar. Started about 1 week ago or so. Was draining out some pus at one time but not now. Has not taken anything for pain even otc. Not using heat or ice. Pain 5/10. Overall worsening. Swelling is getting worse. Cannot sit on it without pain. Denies current risk of pregnancy. Has IBD and not taking meds all the time. Overall symptoms are improving but not gone. Takes oral meds 3 times per week. Never did anusol due to price.   Review of Systems  Constitutional: Negative.   HENT: Negative.   Eyes: Negative.   Respiratory: Negative for cough, chest tightness and shortness of breath.   Cardiovascular: Negative for chest pain, palpitations and leg swelling.  Gastrointestinal: Negative for abdominal distention, abdominal pain, constipation, diarrhea, nausea and vomiting.  Musculoskeletal: Positive for myalgias.  Skin: Negative.   Neurological: Negative.   Psychiatric/Behavioral: Negative.     Objective:  Physical Exam Constitutional:      Appearance: She is well-developed.  HENT:     Head: Normocephalic and atraumatic.  Neck:     Musculoskeletal: Normal range of motion.  Cardiovascular:     Rate and Rhythm: Normal rate and regular rhythm.  Pulmonary:     Effort: Pulmonary effort is normal. No respiratory distress.     Breath sounds: Normal breath sounds. No wheezing or rales.  Abdominal:     General: Bowel sounds are normal. There is no distension.     Palpations: Abdomen is soft.     Tenderness: There is no abdominal tenderness. There is no rebound.  Skin:    General: Skin is warm and dry.     Comments: Hard abscess at the gluteal crest, painful to touch, no fluctuance, no superficial cellulitis surrounding  Neurological:     Mental Status: She is  alert and oriented to person, place, and time.     Coordination: Coordination normal.     Vitals:   05/22/18 1605  BP: 110/80  Pulse: 93  Temp: 99.3 F (37.4 C)  TempSrc: Oral  SpO2: 99%  Weight: 175 lb (79.4 kg)  Height: 5' (1.524 m)    Assessment & Plan:

## 2018-05-22 NOTE — Patient Instructions (Signed)
We have sent in bactrim to take 1 pill twice a day for 1 week.   We have also sent in cream to use twice a day for 1-2 weeks.

## 2018-05-22 NOTE — Progress Notes (Signed)
Reviewed and agree with documentation and assessment and plan. K. Veena Nandigam , MD   

## 2018-05-23 ENCOUNTER — Ambulatory Visit: Payer: 59 | Admitting: Internal Medicine

## 2018-05-23 DIAGNOSIS — L0291 Cutaneous abscess, unspecified: Secondary | ICD-10-CM | POA: Insufficient documentation

## 2018-05-23 NOTE — Assessment & Plan Note (Signed)
Rx for bactrim and advised if not improving to return as it may need I and D. Not amenable to I and D today.

## 2018-05-24 DIAGNOSIS — L0501 Pilonidal cyst with abscess: Secondary | ICD-10-CM | POA: Diagnosis not present

## 2018-05-24 DIAGNOSIS — B029 Zoster without complications: Secondary | ICD-10-CM | POA: Diagnosis not present

## 2018-06-10 ENCOUNTER — Ambulatory Visit: Payer: 59 | Admitting: Gastroenterology

## 2018-11-28 NOTE — Progress Notes (Signed)
Virtual Visit via Video Note  I connected with@ on 12/01/18 at  2:00 PM EDT by a video enabled telemedicine application and verified that I am speaking with the correct person using two identifiers. Location patient: home Location provider:work  office Persons participating in the virtual visit: patient, provider  [place] national recommendations  regarding COVID 19 pandemic   video visit is advised over in office visit for this patient.  Patient aware  of the limitations of evaluation and management by telemedicine and  availability of in person appointments. and agreed to proceed.   HPI: Cheryl Walters presents for video visit for increasing headaches   Seen almoset a year ago for recurrent has and referral placed  Dr Domingo Cocking  But he was not in network and  Didn't get seen   Since then  HAs about 1-2 x per week until last month and very other day. 2 type of HA  Migraine with photophobia and vomiting  And now HA that begins later in day and  may last hours to 8 hours  .  Taking excedrin migraine or tylenol /. Says does use a lot of caffeine sodas etc    Tried to dec stop caffeine and that"didnt go too well"   currently no fever  Not working is at home sleep ok  No  Fever other  . In past topomax helped some but then  May be not working  So didn't follow at that time.   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  Past Medical History:  Diagnosis Date  . Acne   . Active preterm labor 10/23/2016  . Headache(784.0)   . Irregular periods   . Pilonidal cyst     Past Surgical History:  Procedure Laterality Date  . BIRTH CONTROL IMPLANT    . CESAREAN SECTION      Family History  Problem Relation Age of Onset  . Other Mother        irreg periods  . Other Sister        irreg periods  . Sleep apnea Father        on machine  . Breast cancer Maternal Aunt   . Brain cancer Paternal Aunt   . Colon cancer Neg Hx   .  Throat cancer Neg Hx   . Kidney disease Neg Hx   . Liver disease Neg Hx   . Diabetes Neg Hx   . Heart disease Neg Hx   . Stomach cancer Neg Hx     Social History   Tobacco Use  . Smoking status: Never Smoker  . Smokeless tobacco: Never Used  Substance Use Topics  . Alcohol use: Yes    Alcohol/week: 0.0 standard drinks    Comment: 3 every other week  . Drug use: No      Current Outpatient Medications:  .  mesalamine (LIALDA) 1.2 g EC tablet, Take 4 tablets (4.8 g total) by mouth daily with breakfast., Disp: 120 tablet, Rfl: 3 .  SUMAtriptan (IMITREX) 100 MG tablet, Take one at onset of  Headache May repeat in 2 hours if headache persists or recurs., Disp: 10 tablet, Rfl: 3 .  topiramate (TOPAMAX) 50 MG tablet, 25 mg po hs  for 1 week , 50 mg for 1 week, increase to 50 mg po bid for headache suppression, Disp: 60 tablet, Rfl: 1 .  hydrocortisone (ANUSOL-HC) 25 MG suppository, Place 1 suppository (25  mg total) rectally 2 (two) times daily. Take for two weeks (Patient not taking: Reported on 12/01/2018), Disp: 14 suppository, Rfl: 0 .  triamcinolone cream (KENALOG) 0.1 %, Apply 1 application topically 2 (two) times daily. (Patient not taking: Reported on 12/01/2018), Disp: 100 g, Rfl: 0  EXAM: BP Readings from Last 3 Encounters:  05/22/18 110/80  04/18/18 98/60  02/05/18 108/62    VITALS per patient if applicable: looks well at home with her child  Toddler in bed  Nl speech  And non toxic   GENERAL: alert, oriented, appears well and in no acute distress HEENT: atraumatic, conjunttiva clear, no obvious abnormalities on inspection of external nose and ears NECK: normal movements of the head and neck LUNGS: on inspection no signs of respiratory distress, breathing rate appears normal, no obvious gross SOB, gasping or wheezing CV: no obvious cyanosis MS: moves all visible extremities without noticeable abnormality PSYCH/NEURO: pleasant and cooperative, no obvious depression or  anxiety, speech and thought processing grossly intact Lab Results  Component Value Date   WBC 8.2 04/18/2018   HGB 13.6 04/18/2018   HCT 41.2 04/18/2018   PLT 320.0 04/18/2018   GLUCOSE 93 04/18/2018   CHOL 192 12/03/2007   TRIG 37 12/03/2007   HDL 83.2 12/03/2007   LDLCALC 101 (H) 12/03/2007   ALT 13 04/18/2018   AST 14 04/18/2018   NA 139 04/18/2018   K 4.4 04/18/2018   CL 107 04/18/2018   CREATININE 0.82 04/18/2018   BUN 9 04/18/2018   CO2 27 04/18/2018   TSH 2.502 06/11/2014   HGBA1C 5.3 06/11/2014    ASSESSMENT AND PLAN:  Discussed the following assessment and plan:    ICD-10-CM   1. Frequent headaches  R51 Ambulatory referral to Neurology  2. Recurrent headache  R51 Ambulatory referral to Neurology  3. Migraine without status migrainosus, not intractable, unspecified migraine type  G43.909 Ambulatory referral to Neurology   Hx of recurrent HA some migrainous and these may be  Related to caffeine or  Other but at risk to developing  chronic daily headaches      Plan calendar  Has and pills taken  Slow wean cut down on caffeine to avoid rebound .   Go back on Topamax for now  And  Plan other HA neuro referral .  May benefit from newer meds but first  Life style and tracking  At this time  Mom has hx of frequent headaches  Managed also  Counseled.   Expectant management and discussion of plan and treatment with opportunity to ask questions and all were answered. The patient agreed with the plan and demonstrated an understanding of the instructions.   Advised to call back or seek an in-person evaluation if worsening  or having  further concerns . In interim    Shanon Ace, MD

## 2018-12-01 ENCOUNTER — Other Ambulatory Visit: Payer: Self-pay

## 2018-12-01 ENCOUNTER — Encounter: Payer: Self-pay | Admitting: Internal Medicine

## 2018-12-01 ENCOUNTER — Ambulatory Visit (INDEPENDENT_AMBULATORY_CARE_PROVIDER_SITE_OTHER): Payer: 59 | Admitting: Internal Medicine

## 2018-12-01 DIAGNOSIS — G43909 Migraine, unspecified, not intractable, without status migrainosus: Secondary | ICD-10-CM

## 2018-12-01 DIAGNOSIS — R519 Headache, unspecified: Secondary | ICD-10-CM

## 2018-12-01 MED ORDER — TOPIRAMATE 50 MG PO TABS: ORAL_TABLET | ORAL | 1 refills | Status: DC

## 2018-12-08 ENCOUNTER — Telehealth: Payer: Self-pay

## 2018-12-08 ENCOUNTER — Ambulatory Visit: Payer: 59 | Admitting: Neurology

## 2018-12-08 NOTE — Telephone Encounter (Signed)
Pt did not show for their appt with Dr. Athar today.  

## 2018-12-09 ENCOUNTER — Encounter: Payer: Self-pay | Admitting: Neurology

## 2019-03-16 ENCOUNTER — Ambulatory Visit (INDEPENDENT_AMBULATORY_CARE_PROVIDER_SITE_OTHER): Payer: 59 | Admitting: Family Medicine

## 2019-03-16 ENCOUNTER — Other Ambulatory Visit: Payer: Self-pay

## 2019-03-16 ENCOUNTER — Encounter: Payer: Self-pay | Admitting: Family Medicine

## 2019-03-16 DIAGNOSIS — L02412 Cutaneous abscess of left axilla: Secondary | ICD-10-CM

## 2019-03-16 MED ORDER — SULFAMETHOXAZOLE-TRIMETHOPRIM 800-160 MG PO TABS: 1.0000 | ORAL_TABLET | Freq: Two times a day (BID) | ORAL | 0 refills | Status: AC

## 2019-03-16 NOTE — Progress Notes (Signed)
Virtual Visit via Video Note   I connected with Cheryl Walters on 03/16/19 by a video enabled telemedicine application and verified that I am speaking with the correct person using two identifiers.  Location patient: home Location provider:work office Persons participating in the virtual visit: patient, provider  I discussed the limitations of evaluation and management by telemedicine and the availability of in person appointments. The patient expressed understanding and agreed to proceed.   HPI: Cheryl Walters is a 28 yo female c/o tender "bump" in left axilla noted 4 days ago. Better after swimming x 2 days. She thinks it "popped" because she found purulent materia on proximal aspect of arm, above axilla. Pain also resolved.  She has not tried OTC treatment. This is her 2nd episode in 2 months.   She has not identified exacerbating or alleviating factors.  She does not shave, she uses depilatory creams. She denies fever, chills, body aches, fatigue, myalgias.  ROS: See pertinent positives and negatives per HPI.  Past Medical History:  Diagnosis Date  . Acne   . Active preterm labor 10/23/2016  . Headache(784.0)   . Irregular periods   . Pilonidal cyst     Past Surgical History:  Procedure Laterality Date  . BIRTH CONTROL IMPLANT    . CESAREAN SECTION      Family History  Problem Relation Age of Onset  . Other Mother        irreg periods  . Other Sister        irreg periods  . Sleep apnea Father        on machine  . Breast cancer Maternal Aunt   . Brain cancer Paternal Aunt   . Colon cancer Neg Hx   . Throat cancer Neg Hx   . Kidney disease Neg Hx   . Liver disease Neg Hx   . Diabetes Neg Hx   . Heart disease Neg Hx   . Stomach cancer Neg Hx     Social History   Socioeconomic History  . Marital status: Married    Spouse name: Not on file  . Number of children: Not on file  . Years of education: Not on file  . Highest education level: Not on file   Occupational History  . Not on file  Social Needs  . Financial resource strain: Not on file  . Food insecurity    Worry: Not on file    Inability: Not on file  . Transportation needs    Medical: Not on file    Non-medical: Not on file  Tobacco Use  . Smoking status: Never Smoker  . Smokeless tobacco: Never Used  Substance and Sexual Activity  . Alcohol use: Yes    Alcohol/week: 0.0 standard drinks    Comment: 3 every other week  . Drug use: No  . Sexual activity: Not on file  Lifestyle  . Physical activity    Days per week: Not on file    Minutes per session: Not on file  . Stress: Not on file  Relationships  . Social Herbalist on phone: Not on file    Gets together: Not on file    Attends religious service: Not on file    Active member of club or organization: Not on file    Attends meetings of clubs or organizations: Not on file    Relationship status: Not on file  . Intimate partner violence    Fear of current or ex partner: Not  on file    Emotionally abused: Not on file    Physically abused: Not on file    Forced sexual activity: Not on file  Other Topics Concern  . Not on file  Social History Narrative   [place]    Was Living at home    [facility] of [facility]   Played basketball in HS no CV pulm signs   Childbirth in January 2012   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[employer]   Not working out of home at this time    NOw living iwht in [facility], hospital and 4 years okd husband    Small quarters at home all day with 30 yo    To go to pre k if gets accepted  Neg  ocass etoh          Current Outpatient Medications:  .  hydrocortisone (ANUSOL-HC) 25 MG suppository, Place 1 suppository (25 mg total) rectally 2 (two) times daily. Take for two weeks (Patient not taking: Reported on 12/01/2018), Disp: 14 suppository, Rfl: 0 .  mesalamine (LIALDA) 1.2 g EC tablet, Take 4 tablets (4.8 g total) by mouth daily with  breakfast., Disp: 120 tablet, Rfl: 3 .  sulfamethoxazole-trimethoprim (BACTRIM DS) 800-160 MG tablet, Take 1 tablet by mouth 2 (two) times daily for 7 days., Disp: 14 tablet, Rfl: 0 .  SUMAtriptan (IMITREX) 100 MG tablet, Take one at onset of  Headache May repeat in 2 hours if headache persists or recurs., Disp: 10 tablet, Rfl: 3 .  topiramate (TOPAMAX) 50 MG tablet, 25 mg po hs  for 1 week , 50 mg for 1 week, increase to 50 mg po bid for headache suppression, Disp: 60 tablet, Rfl: 1 .  triamcinolone cream (KENALOG) 0.1 %, Apply 1 application topically 2 (two) times daily. (Patient not taking: Reported on 12/01/2018), Disp: 100 g, Rfl: 0  EXAM:  VITALS per patient if applicable:N/A  GENERAL: alert, oriented, appears well and in no acute distress  HEENT: atraumatic, normocephalic, conjunctiva clear, no obvious abnormalities on inspection.  LUNGS: on inspection no signs of respiratory distress, breathing rate appears normal, no obvious gross SOB, gasping or wheezing  CV: no obvious cyanosis  Cheryl: moves all visible extremities without noticeable abnormality SKIN: Left axillary oval erythematous lesion with no active drainage but central punctuate ulcer. See picture.  PSYCH/NEURO: pleasant and cooperative, no obvious depression or anxiety, speech and thought processing grossly intact      ASSESSMENT AND PLAN:  Discussed the following assessment and plan:  Abscess of axilla, left - Plan: sulfamethoxazole-trimethoprim (BACTRIM DS) 800-160 MG tablet  Improved after drained. Recommend oral abx,Bactrim x 7 days. Local heat intermittently. Instructed about warning signs. F/U as needed.   I discussed the assessment and treatment plan with the patient. She was provided an opportunity to ask questions and all were answered. She agreed with the plan and demonstrated an understanding of the instructions.   The patient was advised to call back or seek an in-person evaluation if the symptoms  worsen or if the condition fails to improve as anticipated.  Return if symptoms worsen or fail to improve.    Marcelo Ickes [place], MD

## 2019-04-23 ENCOUNTER — Telehealth (INDEPENDENT_AMBULATORY_CARE_PROVIDER_SITE_OTHER): Payer: 59 | Admitting: Family Medicine

## 2019-04-23 ENCOUNTER — Other Ambulatory Visit: Payer: Self-pay

## 2019-04-23 DIAGNOSIS — U071 COVID-19: Secondary | ICD-10-CM | POA: Diagnosis not present

## 2019-04-23 DIAGNOSIS — J988 Other specified respiratory disorders: Secondary | ICD-10-CM

## 2019-04-23 MED ORDER — BENZONATATE 100 MG PO CAPS: 100.0000 mg | ORAL_CAPSULE | Freq: Three times a day (TID) | ORAL | 0 refills | Status: DC | PRN

## 2019-04-23 NOTE — Patient Instructions (Addendum)
-  I sent the medication(s) we discussed to your pharmacy: Meds ordered this encounter  Medications  . benzonatate (TESSALON PERLES) 100 MG capsule    Sig: Take 1 capsule (100 mg total) by mouth 3 (three) times daily as needed.    Dispense:  20 capsule    Refill:  0    Please let us know if you have any questions or concerns regarding this prescription.  I hope you are feeling better soon! Seek care promptly if your symptoms worsen, new concerns arise or you are not improving over the next several days.   Self Isolation/Home Quarantine: -see the CDC site for information:   RunningShows.co.za.html   -STAY HOME except for to seek medical care -stay in your own room away from others in your house and use a separate bathroom if possible -Wash hands frequently, disinfect high touch surface areas often, wear a mask if you leave your room and interact as little as possible with others -seek medical care immediately if worsening - call our office for a visit or call ahead if going elsewhere to an urgent care  -seek emergency care if very sick or severe symptoms - call 911 -isolate for at least 10 days from the onset of symptoms PLUS 1 day of no fever PLUS 1 day of improving symptoms  Even once you are well, we recommend wearing a mask, social distancing, good hand hygiene and asking others  around you to do the same at all times when around others outside of your home unit throughout the [place] pandemic.

## 2019-04-23 NOTE — Progress Notes (Signed)
Virtual Visit via Video Note  I connected with Cheryl Walters  on 04/23/19 at  4:00 PM EST by a video enabled telemedicine application and verified that I am speaking with the correct person using two identifiers.  Location patient: home Location provider:work or home office Persons participating in the virtual visit: patient, provider  I discussed the limitations of evaluation and management by telemedicine and the availability of in person appointments. The patient expressed understanding and agreed to proceed.   HPI:  Acute visit for COVID19: -she was exposed to Cheryl Walters Nov 28th and started getting symptoms December 2nd -tested on the 4th positive for COVID -symptoms include sore throat initially, now nasal congestion, HA, loss of taste and smell, some body aches, cough, mild SOB if active -denies fever, trouble breathing, trouble getting at of bed, worsening, trouble eating or drinking -she was seen in [facility] and was given and inhaler which helps -denies hx of immunocompromise, lung disease or any chronic conditions  ROS: See pertinent positives and negatives per HPI.  Past Medical History:  Diagnosis Date  . Acne   . Active preterm labor 10/23/2016  . Headache(784.0)   . Irregular periods   . Pilonidal cyst     Past Surgical History:  Procedure Laterality Date  . BIRTH CONTROL IMPLANT    . CESAREAN SECTION      Family History  Problem Relation Age of Onset  . Other Mother        irreg periods  . Other Sister        irreg periods  . Sleep apnea Father        on machine  . Breast cancer Maternal Aunt   . Brain cancer Paternal Aunt   . Colon cancer Neg Hx   . Throat cancer Neg Hx   . Kidney disease Neg Hx   . Liver disease Neg Hx   . Diabetes Neg Hx   . Heart disease Neg Hx   . Stomach cancer Neg Hx     SOCIAL HX: see hpi   Current Outpatient Medications:  .  benzonatate (TESSALON PERLES) 100 MG capsule, Take 1 capsule (100 mg total) by mouth 3 (three) times daily as  needed., Disp: 20 capsule, Rfl: 0 .  hydrocortisone (ANUSOL-HC) 25 MG suppository, Place 1 suppository (25 mg total) rectally 2 (two) times daily. Take for two weeks (Patient not taking: Reported on 12/01/2018), Disp: 14 suppository, Rfl: 0 .  mesalamine (LIALDA) 1.2 g EC tablet, Take 4 tablets (4.8 g total) by mouth daily with breakfast., Disp: 120 tablet, Rfl: 3 .  SUMAtriptan (IMITREX) 100 MG tablet, Take one at onset of  Headache May repeat in 2 hours if headache persists or recurs., Disp: 10 tablet, Rfl: 3 .  topiramate (TOPAMAX) 50 MG tablet, 25 mg po hs  for 1 week , 50 mg for 1 week, increase to 50 mg po bid for headache suppression, Disp: 60 tablet, Rfl: 1 .  triamcinolone cream (KENALOG) 0.1 %, Apply 1 application topically 2 (two) times daily. (Patient not taking: Reported on 12/01/2018), Disp: 100 g, Rfl: 0  EXAM:  VITALS per patient if applicable:  GENERAL: alert, oriented, appears well and in no acute distress  HEENT: atraumatic, conjunttiva clear, no obvious abnormalities on inspection of external nose and ears  NECK: normal movements of the head and neck  LUNGS: on inspection no signs of respiratory distress, breathing rate appears normal, no obvious gross SOB, gasping or wheezing  CV: no obvious cyanosis  MS:  moves all visible extremities without noticeable abnormality  PSYCH/NEURO: pleasant and cooperative, no obvious depression or anxiety, speech and thought processing grossly intact  ASSESSMENT AND PLAN:  Discussed the following assessment and plan: More than 50% of over 25 minutes spent in total in caring for this patient was spent face-to-face with the patient, counseling and/or coordinating care.   Respiratory tract infection due to 2019 novel coronavirus  -we discussed possible serious and likely etiologies, options for evaluation and workup, limitations of telemedicine visit vs in person visit, treatment, treatment risks and precautions. Pt prefers to treat via  telemedicine empirically rather then risking or undertaking an in person visit at this moment. She has a known diagnosis of COVID19. She is asking about return to work. Discussed CDC protocols for home isolation. She wondered about retesting, advised that is not usually recommended per CDC. Discussed symptomatic care, precautions, home isolation, prevention, masking and socially distancing throughout the pandemic with anyone outside of the household. Sent rx for cough per her request after discussion risks - advised or risks, proper use and keeping away from children. Offered scheduled follow up, declined. Patient agrees to seek prompt in person care if worsening, new symptoms arise, or if is not improving with treatment.   I discussed the assessment and treatment plan with the patient. The patient was provided an opportunity to ask questions and all were answered. The patient agreed with the plan and demonstrated an understanding of the instructions.   The patient was advised to call back or seek an in-person evaluation if the symptoms worsen or if the condition fails to improve as anticipated.   Cheryl Kern, DO

## 2019-06-29 ENCOUNTER — Encounter: Payer: Self-pay | Admitting: Neurology

## 2019-06-29 ENCOUNTER — Telehealth: Payer: Self-pay | Admitting: Internal Medicine

## 2019-06-29 DIAGNOSIS — G43909 Migraine, unspecified, not intractable, without status migrainosus: Secondary | ICD-10-CM

## 2019-06-29 DIAGNOSIS — R519 Headache, unspecified: Secondary | ICD-10-CM

## 2019-06-29 NOTE — Telephone Encounter (Signed)
Patient called back, was informed a new referral will be entered and is aware someone will call with appt info.  Patient stated she is currently only taking Excedrin migraine as needed with no relief and the previous medication given did not help.  Message sent to PCP.

## 2019-06-29 NOTE — Telephone Encounter (Signed)
Pt states she needs another referral sent to [facility] b/c she was not able to set an appt up in time and the referral expired   Pt can be reached at [phone]

## 2019-06-29 NOTE — Telephone Encounter (Signed)
Left a message for the pt to return my call.  

## 2019-06-29 NOTE — Telephone Encounter (Signed)
Ok to do new referral as requested    Please update what meds she is taking for her headaches .

## 2019-08-02 ENCOUNTER — Emergency Department (HOSPITAL_COMMUNITY)
Admission: EM | Admit: 2019-08-02 | Discharge: 2019-08-02 | Disposition: A | Discharge status: Home / Self Care | Payer: 59 | Attending: Emergency Medicine | Admitting: Emergency Medicine

## 2019-08-02 ENCOUNTER — Other Ambulatory Visit: Payer: Self-pay

## 2019-08-02 ENCOUNTER — Encounter (HOSPITAL_COMMUNITY): Payer: Self-pay | Admitting: Emergency Medicine

## 2019-08-02 DIAGNOSIS — J029 Acute pharyngitis, unspecified: Secondary | ICD-10-CM | POA: Diagnosis present

## 2019-08-02 DIAGNOSIS — M7918 Myalgia, other site: Secondary | ICD-10-CM | POA: Insufficient documentation

## 2019-08-02 DIAGNOSIS — R059 Cough, unspecified: Secondary | ICD-10-CM

## 2019-08-02 DIAGNOSIS — Z20822 Contact with and (suspected) exposure to covid-19: Secondary | ICD-10-CM | POA: Diagnosis not present

## 2019-08-02 DIAGNOSIS — Z79899 Other long term (current) drug therapy: Secondary | ICD-10-CM | POA: Insufficient documentation

## 2019-08-02 DIAGNOSIS — R05 Cough: Secondary | ICD-10-CM | POA: Diagnosis not present

## 2019-08-02 DIAGNOSIS — R52 Pain, unspecified: Secondary | ICD-10-CM

## 2019-08-02 LAB — COMPREHENSIVE METABOLIC PANEL
ALT: 15 U/L (ref 0–44)
AST: 18 U/L (ref 15–41)
Albumin: 3.6 g/dL (ref 3.5–5.0)
Alkaline Phosphatase: 70 U/L (ref 38–126)
Anion gap: 9 (ref 5–15)
BUN: 7 mg/dL (ref 6–20)
CO2: 26 mmol/L (ref 22–32)
Calcium: 9.2 mg/dL (ref 8.9–10.3)
Chloride: 102 mmol/L (ref 98–111)
Creatinine, Ser: 0.93 mg/dL (ref 0.44–1.00)
GFR calc Af Amer: >60 mL/min (ref >60)
GFR calc non Af Amer: >60 mL/min (ref >60)
Glucose, Bld: 114 mg/dL — ABNORMAL HIGH (ref 70–99)
Potassium: 3.9 mmol/L (ref 3.5–5.1)
Sodium: 137 mmol/L (ref 135–145)
Total Bilirubin: 0.5 mg/dL (ref 0.3–1.2)
Total Protein: 7.4 g/dL (ref 6.5–8.1)

## 2019-08-02 LAB — CBC WITH DIFFERENTIAL/PLATELET
Abs Immature Granulocytes: 0.06 10*3/uL (ref 0.00–0.07)
Basophils Absolute: 0.1 10*3/uL (ref 0.0–0.1)
Basophils Relative: 0 %
Eosinophils Absolute: 0.3 10*3/uL (ref 0.0–0.5)
Eosinophils Relative: 2 %
HCT: 41 % (ref 36.0–46.0)
Hemoglobin: 13.1 g/dL (ref 12.0–15.0)
Immature Granulocytes: 0 %
Lymphocytes Relative: 20 %
Lymphs Abs: 2.8 10*3/uL (ref 0.7–4.0)
MCH: 26.8 pg (ref 26.0–34.0)
MCHC: 32 g/dL (ref 30.0–36.0)
MCV: 83.8 fL (ref 80.0–100.0)
Monocytes Absolute: 1.1 10*3/uL — ABNORMAL HIGH (ref 0.1–1.0)
Monocytes Relative: 8 %
Neutro Abs: 9.7 10*3/uL — ABNORMAL HIGH (ref 1.7–7.7)
Neutrophils Relative %: 70 %
Platelets: 318 10*3/uL (ref 150–400)
RBC: 4.89 MIL/uL (ref 3.87–5.11)
RDW: 13.2 % (ref 11.5–15.5)
WBC: 14 10*3/uL — ABNORMAL HIGH (ref 4.0–10.5)
nRBC: 0 % (ref 0.0–0.2)

## 2019-08-02 LAB — I-STAT BETA HCG BLOOD, ED (MC, WL, AP ONLY): I-stat hCG, quantitative: <5 m[IU]/mL (ref <5)

## 2019-08-02 LAB — GROUP A STREP BY PCR: Group A Strep by PCR: NOT DETECTED

## 2019-08-02 LAB — SARS CORONAVIRUS 2 (TAT 6-24 HRS): SARS Coronavirus 2: NEGATIVE

## 2019-08-02 MED ORDER — PREDNISONE 10 MG (21) PO TBPK: ORAL_TABLET | ORAL | 0 refills | Status: DC

## 2019-08-02 MED ORDER — IBUPROFEN 400 MG PO TABS
600.0000 mg | ORAL_TABLET | Freq: Once | ORAL | Status: AC
  Administered 2019-08-02: 08:00:00 600 mg via ORAL
  Filled 2019-08-02: qty 1

## 2019-08-02 MED ORDER — ONDANSETRON 4 MG PO TBDP: 4.0000 mg | ORAL_TABLET | Freq: Three times a day (TID) | ORAL | 0 refills | Status: DC | PRN

## 2019-08-02 MED ORDER — BENZONATATE 100 MG PO CAPS: 100.0000 mg | ORAL_CAPSULE | Freq: Three times a day (TID) | ORAL | 0 refills | Status: DC

## 2019-08-02 NOTE — ED Provider Notes (Signed)
[facility] EMERGENCY DEPARTMENT Provider Note   CSN: [id number] Arrival date & time: 08/02/19  0308     History Chief Complaint  Patient presents with  . Headache / Body Aches    Cheryl Walters is a 29 y.o. female.  HPI     Cheryl Walters is a 29 y.o. female, with a history of headaches, presenting to the ED with sore throat, body aches, chills for the last 2 days.  Also notes intermittent, generalized, mild to moderate, throbbing headache and "maybe a cough."  Denies fever, neuro deficits, dizziness, syncope, chest pain, shortness of breath, urinary symptoms, difficulty swallowing, voice change, N/V/D, neck pain/stiffness, or any other complaints.   Past Medical History:  Diagnosis Date  . Acne   . Active preterm labor 10/23/2016  . Headache(784.0)   . Irregular periods   . Pilonidal cyst     Patient Active Problem List   Diagnosis Date Noted  . Abscess 05/23/2018  . IUD (intrauterine device)  09/04/2017  . Blood in stool 06/25/2014  . Acne vulgaris 06/25/2014  . 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 Muscle spasm of back 03/18/2013  . Chronic rhinitis 08/13/2011  . Recurrent headache 08/13/2011  . 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 CHEST PAIN 12/09/2008  . IRREGULAR MENSTRUAL CYCLE 07/31/2007  . ACNE VULGARIS 07/31/2007    Past Surgical History:  Procedure Laterality Date  . BIRTH CONTROL IMPLANT    . CESAREAN SECTION       OB History    Gravida  2   Para  2   Term  1   Preterm  1   AB      Living  2     SAB      TAB      Ectopic      Multiple  0   Live Births  1           Family History  Problem Relation Age of Onset  . Other Mother        irreg periods  . Other Sister        irreg periods  . Sleep apnea Father        on machine  . Breast cancer Maternal Aunt   . Brain cancer Paternal Aunt   . Colon cancer Neg Hx   . Throat cancer Neg Hx   . Kidney disease Neg Hx   . Liver disease  Neg Hx   . Diabetes Neg Hx   . Heart disease Neg Hx   . Stomach cancer Neg Hx     Social History   Tobacco Use  . Smoking status: Never Smoker  . Smokeless tobacco: Never Used  Substance Use Topics  . Alcohol use: Yes    Alcohol/week: 0.0 standard drinks    Comment: 3 every other week  . Drug use: No    Home Medications Prior to Admission medications   Medication Sig Start Date End Date Taking? Authorizing Provider  benzonatate (TESSALON) 100 MG capsule Take 1 capsule (100 mg total) by mouth every 8 (eight) hours. 08/02/19   Little Bashore, Helane Gunther, PA-C  hydrocortisone (ANUSOL-HC) 25 MG suppository Place 1 suppository (25 mg total) rectally 2 (two) times daily. Take for two weeks Patient not taking: Reported on 12/01/2018 04/18/18   Levin Erp, PA  mesalamine (LIALDA) 1.2 g EC tablet Take 4 tablets (4.8 g total) by mouth daily with breakfast. 04/18/18   Levin Erp, PA  ondansetron (  ZOFRAN ODT) 4 MG disintegrating tablet Take 1 tablet (4 mg total) by mouth every 8 (eight) hours as needed for nausea or vomiting. 08/02/19   Afua Hoots C, PA-C  predniSONE (STERAPRED UNI-PAK 21 TAB) 10 MG (21) TBPK tablet Take 6 tabs (28m) day 1, 5 tabs (526m day 2, 4 tabs (4021mday 3, 3 tabs (21m25may 4, 2 tabs (20mg35my 5, and 1 tab (10mg)50m 6. 08/02/19   Samual Beals C, PA-C  SUMAtriptan (IMITREX) 100 MG tablet Take one at onset of  Headache May repeat in 2 hours if headache persists or recurs. 11/08/17   Banks,Billie Ruddytopiramate (TOPAMAX) 50 MG tablet 25 mg po hs  for 1 week , 50 mg for 1 week, increase to 50 mg po bid for headache suppression 12/01/18   Panosh, Wanda Standley Brookingtriamcinolone cream (KENALOG) 0.1 % Apply 1 application topically 2 (two) times daily. Patient not taking: Reported on 12/01/2018 05/22/18   CrawfoHoyt Koch  Allergies    Patient has no known allergies.  Review of Systems   Review of Systems  Constitutional: Positive for chills. Negative for fever.   HENT: Positive for sore throat. Negative for trouble swallowing and voice change.   Respiratory: Positive for cough. Negative for shortness of breath.   Cardiovascular: Negative for chest pain.  Gastrointestinal: Negative for abdominal pain, diarrhea, nausea and vomiting.  Genitourinary: Negative for dysuria.  Musculoskeletal: Positive for myalgias.  Neurological: Positive for headaches. Negative for dizziness, syncope, weakness, light-headedness and numbness.  All other systems reviewed and are negative.   Physical Exam Updated Vital Signs BP 133/85 (BP Location: Left Arm)   Pulse 94   Temp 99 F (37.2 C) (Oral)   Resp 18   Ht 5' (1.524 m)   Wt 85 kg   SpO2 99%   BMI 36.60 kg/m   Physical Exam Vitals and nursing note reviewed.  Constitutional:      General: She is not in acute distress.    Appearance: She is well-developed. She is not diaphoretic.  HENT:     Head: Normocephalic and atraumatic.     Mouth/Throat:     Mouth: Mucous membranes are moist.     Pharynx: Oropharynx is clear.     Comments: Dentition appears to be intact and stable.  No noted area of intraoral swelling or fluctuance.  No trismus or noted abnormal phonation.  Mouth opening to at least 3 finger widths.  Handles oral secretions without difficulty.  No noted facial swelling.  No sublingual swelling.  No swelling or tenderness to the submental or submandibular regions.  No swelling or tenderness into the soft tissues of the neck. Eyes:     Conjunctiva/sclera: Conjunctivae normal.  Cardiovascular:     Rate and Rhythm: Normal rate and regular rhythm.     Pulses: Normal pulses.          Radial pulses are 2+ on the right side and 2+ on the left side.       Posterior tibial pulses are 2+ on the right side and 2+ on the left side.     Heart sounds: Normal heart sounds.     Comments: Tactile temperature in the extremities appropriate and equal bilaterally. Pulmonary:     Effort: Pulmonary effort is normal. No  respiratory distress.     Breath sounds: Normal breath sounds.  Abdominal:     Palpations: Abdomen is soft.     Tenderness:  There is no abdominal tenderness. There is no guarding.  Musculoskeletal:     Cervical back: Normal range of motion and neck supple. No tenderness.     Right lower leg: No edema.     Left lower leg: No edema.  Lymphadenopathy:     Cervical: No cervical adenopathy.  Skin:    General: Skin is warm and dry.  Neurological:     Mental Status: She is alert and oriented to person, place, and time.     Comments: No noted acute cognitive deficit. Sensation grossly intact to light touch in the extremities.   Grip strengths equal bilaterally.   Strength 5/5 in all extremities.  No gait disturbance.  Coordination intact.  Cranial nerves III-XII grossly intact.  Handles oral secretions without noted difficulty.  No noted phonation or speech deficit. No facial droop.   Psychiatric:        Mood and Affect: Mood and affect normal.        Speech: Speech normal.        Behavior: Behavior normal.     ED Results / Procedures / Treatments   Labs (all labs ordered are listed, but only abnormal results are displayed) Labs Reviewed  CBC WITH DIFFERENTIAL/PLATELET - Abnormal; Notable for the following components:      Result Value   WBC 14.0 (*)    Neutro Abs 9.7 (*)    Monocytes Absolute 1.1 (*)    All other components within normal limits  COMPREHENSIVE METABOLIC PANEL - Abnormal; Notable for the following components:   Glucose, Bld 114 (*)    All other components within normal limits  GROUP A STREP BY PCR  SARS CORONAVIRUS 2 (TAT 6-24 HRS)  I-STAT BETA HCG BLOOD, ED ([facility], [facility], [facility] ONLY)    EKG None  Radiology No results found.  Procedures Procedures (including critical care time)  Medications Ordered in ED Medications  ibuprofen (ADVIL) tablet 600 mg (600 mg Oral Given 08/02/19 0756)    ED Course  I have reviewed the triage vital signs and the nursing  notes.  Pertinent labs & imaging results that were available during my care of the patient were reviewed by me and considered in my medical decision making (see chart for details).    MDM Rules/Calculators/A&P                       Patient presents with a variety of symptoms.  Patient is nontoxic appearing, afebrile, not tachycardic, not tachypneic, not hypotensive, maintains excellent SPO2 on room air, and is in no apparent distress.  The multisystem nature of her complaints suggest possible viral origin of her symptoms.  This may explain her leukocytosis. COVID-19 infection is a possibility.  Although many COVID-19 infections have normal WBC count or even leukopenia, presence of leukocytosis does not exclude this diagnosis. She has no abdominal tenderness, no urinary symptoms, no shortness of breath or abnormalities on lung exam.  My suspicion for meningitis is low; her neuro exam is normal, she has no meningeal signs, she has no confusion, no fever, no neck pain. I reviewed and interpreted the patient's labs. The patient was given instructions for home care as well as return precautions. Patient voices understanding of these instructions, accepts the plan, and is comfortable with discharge.    Vitals:   08/02/19 0316 08/02/19 0317 08/02/19 0758  BP: 133/85  132/81  Pulse: 94  88  Resp: 18  16  Temp: 99 F (37.2 C)  TempSrc: Oral    SpO2: 99%  99%  Weight:  85 kg   Height:  5' (1.524 m)    Cheryl Walters was evaluated in Emergency Department on 08/02/2019 for the symptoms described in the history of present illness. She was evaluated in the context of the global COVID-19 pandemic, which necessitated consideration that the patient might be at risk for infection with the SARS-CoV-2 virus that causes COVID-19. Institutional protocols and algorithms that pertain to the evaluation of patients at risk for COVID-19 are in a state of rapid change based on information released by  regulatory bodies including the CDC and federal and state organizations. These policies and algorithms were followed during the patient's care in the ED.  Final Clinical Impression(s) / ED Diagnoses Final diagnoses:  Sore throat  Body aches  Cough    Rx / DC Orders ED Discharge Orders         Ordered    ondansetron (ZOFRAN ODT) 4 MG disintegrating tablet  Every 8 hours PRN     08/02/19 0838    benzonatate (TESSALON) 100 MG capsule  Every 8 hours     08/02/19 0838    predniSONE (STERAPRED UNI-PAK 21 TAB) 10 MG (21) TBPK tablet     08/02/19 0838           Lorayne Bender, PA-C 08/02/19 0859    Mesner, Corene Cornea, MD 08/02/19 1425

## 2019-08-02 NOTE — ED Triage Notes (Signed)
Patient reports headache and gen. body aches today with mild lightheaded/fatigue .

## 2019-08-02 NOTE — Discharge Instructions (Signed)
General Viral Syndrome Care Instructions:  Your symptoms are likely consistent with a viral illness, which may include COVID-19 infection. Viruses do not require or respond to antibiotics. Treatment is symptomatic care and it is important to note that these symptoms may last for 7-14 days.   It is important to note that situations and symptoms can change.  Keep a careful watch for changes in, especially worsening, symptoms.  Hand washing: Wash your hands throughout the day, but especially before and after touching the face, using the restroom, sneezing, coughing, or touching surfaces that have been coughed or sneezed upon. Hydration: Symptoms of most illnesses will be intensified and complicated by dehydration. Dehydration can also extend the duration of symptoms. Drink plenty of fluids and get plenty of rest. You should be drinking at least half a liter of water an hour to stay hydrated. Electrolyte drinks (ex. Gatorade, Powerade, Pedialyte) are also encouraged. You should be drinking enough fluids to make your urine light yellow, almost clear. If this is not the case, you are not drinking enough water. Please note that some of the treatments indicated below will not be effective if you are not adequately hydrated. Diet: Please concentrate on hydration, however, you may introduce food slowly.  Start with a clear liquid diet, progressed to a full liquid diet, and then bland solids as you are able. Pain or fever: Ibuprofen, Naproxen, or acetaminophen (generic for Tylenol) for pain or fever.  Antiinflammatory medications: Take 600 mg of ibuprofen every 6 hours or 440 mg (over the counter dose) to 500 mg (prescription dose) of naproxen every 12 hours for the next 3 days. After this time, these medications may be used as needed for pain. Take these medications with food to avoid upset stomach. Choose only one of these medications, do not take them together. Acetaminophen (generic for Tylenol): Should you  continue to have additional pain while taking the ibuprofen or naproxen, you may add in acetaminophen as needed. Your daily total maximum amount of acetaminophen from all sources should be limited to 4042m/day for persons without liver problems, or 20058mday for those with liver problems. Nausea/vomiting: Use the ondansetron (generic for Zofran) for nausea or vomiting.  This medication may not prevent all vomiting or nausea, but can help facilitate better hydration. Things that can help with nausea/vomiting also include peppermint/menthol candies, vitamin B12, and ginger. Diarrhea: May use medications such as loperamide (Imodium) or Bismuth subsalicylate (Pepto-Bismol). Cough: Use the benzonatate (generic for Tessalon) for cough.  Teas, warm liquids, broths, and honey can also help with cough. Prednisone: Take the prednisone, as directed, in its entirety. Zyrtec or Claritin: May add these medication daily to control underlying symptoms of congestion, sneezing, and other signs of allergies.  These medications are available over-the-counter. Generics: Cetirizine (generic for Zyrtec) and loratadine (generic for Claritin). Fluticasone: Use fluticasone (generic for Flonase), as directed, for nasal and sinus congestion.  This medication is available over-the-counter. Congestion: Plain guaifenesin (generic for plain Mucinex) may help relieve congestion. Saline sinus rinses and saline nasal sprays may also help relieve congestion. If you do not have high blood pressure, heart problems, or an allergy to such medications, you may also try phenylephrine or Sudafed. Sore throat: Warm liquids or Chloraseptic spray may help soothe a sore throat. Gargle twice a day with a salt water solution made from a half teaspoon of salt in a cup of warm water.  Follow up: Follow up with a primary care provider within the next two weeks should symptoms  fail to resolve. Return: Return to the ED for significantly worsening  symptoms, shortness of breath, persistent vomiting, large amounts of blood in stool, difficulty swallowing, drooling, spreading neck pain, or any other major concerns.  For prescription assistance, may try using prescription discount sites or apps, such as goodrx.com  Test Results for COVID-19 pending  You have a test pending for COVID-19.  Results typically return within about 48 hours.  Be sure to check MyChart for updated results.  We recommend isolating yourself until results are received.  Patients who have symptoms consistent with COVID-19 should self isolated for: At least 3 days (72 hours) have passed since recovery, defined as resolution of fever without the use of fever reducing medications and improvement in respiratory symptoms (e.g., cough, shortness of breath), and At least 7 days have passed since symptoms first appeared.  If you have no symptoms, but your test returns positive, recommend isolating for at least 10 days.

## 2019-08-02 NOTE — ED Notes (Signed)
Patient verbalized understanding of dc instructions, vss, ambulatory with nad.   

## 2019-08-05 ENCOUNTER — Other Ambulatory Visit (INDEPENDENT_AMBULATORY_CARE_PROVIDER_SITE_OTHER): Payer: 59

## 2019-08-05 ENCOUNTER — Ambulatory Visit (INDEPENDENT_AMBULATORY_CARE_PROVIDER_SITE_OTHER): Payer: 59 | Admitting: Physician Assistant

## 2019-08-05 ENCOUNTER — Encounter: Payer: Self-pay | Admitting: Physician Assistant

## 2019-08-05 ENCOUNTER — Telehealth: Payer: Self-pay | Admitting: Physician Assistant

## 2019-08-05 VITALS — BP 112/70 | HR 84 | Temp 97.0°F | Ht 60.0 in | Wt 184.1 lb

## 2019-08-05 DIAGNOSIS — K5120 Ulcerative (chronic) proctitis without complications: Secondary | ICD-10-CM

## 2019-08-05 DIAGNOSIS — K51219 Ulcerative (chronic) proctitis with unspecified complications: Secondary | ICD-10-CM | POA: Diagnosis not present

## 2019-08-05 DIAGNOSIS — K644 Residual hemorrhoidal skin tags: Secondary | ICD-10-CM | POA: Diagnosis not present

## 2019-08-05 LAB — CBC WITH DIFFERENTIAL/PLATELET
Basophils Absolute: 0.1 10*3/uL (ref 0.0–0.1)
Basophils Relative: 0.3 % (ref 0.0–3.0)
Eosinophils Absolute: 0 10*3/uL (ref 0.0–0.7)
Eosinophils Relative: 0 % (ref 0.0–5.0)
HCT: 41.1 % (ref 36.0–46.0)
Hemoglobin: 13.3 g/dL (ref 12.0–15.0)
Lymphocytes Relative: 13.9 % (ref 12.0–46.0)
Lymphs Abs: 2.1 10*3/uL (ref 0.7–4.0)
MCHC: 32.3 g/dL (ref 30.0–36.0)
MCV: 82.4 fl (ref 78.0–100.0)
Monocytes Absolute: 1 10*3/uL (ref 0.1–1.0)
Monocytes Relative: 6.7 % (ref 3.0–12.0)
Neutro Abs: 12.2 10*3/uL — ABNORMAL HIGH (ref 1.4–7.7)
Neutrophils Relative %: 79.1 % — ABNORMAL HIGH (ref 43.0–77.0)
Platelets: 369 10*3/uL (ref 150.0–400.0)
RBC: 4.98 Mil/uL (ref 3.87–5.11)
RDW: 13.7 % (ref 11.5–15.5)
WBC: 15.4 10*3/uL — ABNORMAL HIGH (ref 4.0–10.5)

## 2019-08-05 LAB — COMPREHENSIVE METABOLIC PANEL
ALT: 15 U/L (ref 0–35)
AST: 12 U/L (ref 0–37)
Albumin: 4 g/dL (ref 3.5–5.2)
Alkaline Phosphatase: 76 U/L (ref 39–117)
BUN: 12 mg/dL (ref 6–23)
CO2: 28 mEq/L (ref 19–32)
Calcium: 9.3 mg/dL (ref 8.4–10.5)
Chloride: 102 mEq/L (ref 96–112)
Creatinine, Ser: 0.82 mg/dL (ref 0.40–1.20)
GFR: 81.5 mL/min (ref >60.00)
Glucose, Bld: 121 mg/dL — ABNORMAL HIGH (ref 70–99)
Potassium: 3.7 mEq/L (ref 3.5–5.1)
Sodium: 136 mEq/L (ref 135–145)
Total Bilirubin: 0.3 mg/dL (ref 0.2–1.2)
Total Protein: 8.1 g/dL (ref 6.0–8.3)

## 2019-08-05 MED ORDER — HYDROCORTISONE (PERIANAL) 2.5 % EX CREA: 1.0000 "application " | TOPICAL_CREAM | Freq: Two times a day (BID) | CUTANEOUS | 0 refills | Status: AC

## 2019-08-05 MED ORDER — MESALAMINE 1.2 G PO TBEC: 4.8000 g | DELAYED_RELEASE_TABLET | Freq: Every day | ORAL | 3 refills | Status: DC

## 2019-08-05 NOTE — Patient Instructions (Addendum)
If you are age 31 or older, your body mass index should be between 23-30. Your Body mass index is 35.96 kg/m. If this is out of the aforementioned range listed, please consider follow up with your Primary Care Provider.  If you are age 75 or younger, your body mass index should be between 19-25. Your Body mass index is 35.96 kg/m. If this is out of the aformentioned range listed, please consider follow up with your Primary Care Provider.   START Hydrocortisone cream 2.5% twice daily for 2 weeks. This has been sent to your pharmacy.  Continue taking your Lialda. A refill has been sent into your pharmacy.  Pick up Preparation H suppositories over the counter and use use for 1 week with the Hydrocortisone cream twice daily.   Your provider has requested that you go to the basement level for lab work before leaving today. Press "B" on the elevator. The lab is located at the first door on the left as you exit the elevator.  Follow up as needed.

## 2019-08-05 NOTE — Progress Notes (Signed)
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    04/18/2018 patient seen in clinic.  At that time a previous colonoscopy noted March 2016 with findings of proctitis from 0-5 cm consistent with ulcerative proctitis with edematous friable mucosal granularity.  Biopsies showed chronic active inflammation consistent with IBD and she was given Anusol HC suppositories.,  She had continued with bleeding at follow-up and was prescribed Canasa suppositories at bedtime.  Explained that she cannot afford Canasa suppositories.  At time of last follow-up in December she continued with symptoms because she could not can forward her Canasa suppositories.  She was prescribed Lialda 4.8 g p.o. daily and a CBC and CMP ordered for baseline.  Also prescribed hydrocortisone suppositories twice daily x2 weeks.  Patient missed her follow-up with Dr. Silverio Decamp.    Today, the patient tells me that as long as she is taking her Lialda, which she takes 2 tabs in the morning and 2 in the evening, then she has no further rectal bleeding or ulcerative proctitis symptoms.  Does tell me that she is not as good as she wants to be about taking her medicine so occasionally she has breakthrough symptoms.      Currently is here with complaints of a hemorrhoid which she has been treating with over-the-counter cream for the past week.  Tells me initially this hemorrhoid was painful described as a sharp pain, but after using the cream for 3 days this pain went away and now she can just still feel it there.  Also tells me that typically when she has a bowel movement she feels another hemorrhoid come out, but she typically pushes it back in and has no other symptoms from it.  This hemorrhoid  she has been feeling for at least 6 years.    Denies fever, chills, weight loss or symptoms that awaken her from sleep.  Past Medical History:  Diagnosis Date  . Acne   . Active preterm labor 10/23/2016  . Headache(784.0)   . Irregular periods   . Pilonidal cyst     Past Surgical History:  Procedure Laterality Date  . BIRTH CONTROL IMPLANT    . CESAREAN SECTION      Current Outpatient Medications  Medication Sig Dispense Refill  . acetaminophen (TYLENOL) 500 MG tablet Take 500 mg by mouth every 6 (six) hours as needed.    Marland Kitchen aspirin-acetaminophen-caffeine (EXCEDRIN MIGRAINE) 250-250-65 MG tablet Take by mouth every 6 (six) hours as needed for headache.    . benzonatate (TESSALON) 100 MG capsule Take 1 capsule (100 mg total) by mouth every 8 (eight) hours. 21 capsule 0  . mesalamine (LIALDA) 1.2 g EC tablet Take 4 tablets (4.8 g total) by mouth daily with breakfast. 120 tablet 3  . ondansetron (ZOFRAN ODT) 4 MG disintegrating tablet Take 1 tablet (4 mg total) by mouth every 8 (eight) hours as needed for nausea or vomiting. 20 tablet 0  . predniSONE (STERAPRED UNI-PAK 21 TAB) 10 MG (21) TBPK tablet Take 6 tabs (73m) day 1, 5 tabs (537m day 2, 4 tabs (4048mday 3, 3 tabs (59m80may 4, 2 tabs (20mg28my 5, and 1 tab (10mg)34m 6. 21 tablet 0   No current facility-administered medications for this visit.  Allergies as of 08/05/2019  . (No Known Allergies)    Family History  Problem Relation Age of Onset  . Other Mother        irreg periods  . Other Sister        irreg periods  . Sleep apnea Father        on machine  . Breast cancer Maternal Aunt   . Brain cancer Paternal Aunt   . Colon cancer Neg Hx   . Throat cancer Neg Hx   . Kidney disease Neg Hx   . Liver disease Neg Hx   . Diabetes Neg Hx   . Heart disease Neg Hx   . Stomach cancer Neg Hx     Social History   Socioeconomic History  . Marital status: Married    Spouse name: Not on file  . Number of children:  Not on file  . Years of education: Not on file  . Highest education level: Not on file  Occupational History  . Not on file  Tobacco Use  . Smoking status: Never Smoker  . Smokeless tobacco: Never Used  Substance and Sexual Activity  . Alcohol use: Yes    Alcohol/week: 0.0 standard drinks    Comment: 3 every other week  . Drug use: No  . Sexual activity: Not on file  Other Topics Concern  . 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 Difficulty of Paying Living Expenses:   Food Insecurity:   . Worried About Charity fundraiser in the Last Year:   . Arboriculturist in the Last Year:   Transportation Needs:   . Film/video editor (Medical):   Marland Kitchen Lack of Transportation (Non-Medical):   Physical Activity:   . Days of Exercise per Week:   . Minutes of Exercise per Session:   Stress:   . Feeling of Stress :   Social Connections:   . Frequency of Communication with Friends and Family:   . Frequency of Social Gatherings with Friends and Family:   . Attends Religious Services:   . Active Member of Clubs or Organizations:   . Attends Archivist Meetings:   Marland Kitchen Marital Status:   Intimate Partner Violence:   . Fear of Current or Ex-Partner:   . Emotionally Abused:   Marland Kitchen Physically Abused:   . Sexually Abused:     Review of Systems:    Constitutional: No weight loss, fever or chills Cardiovascular: No chest pain Respiratory: No SOB  Gastrointestinal: See HPI and otherwise negative    Physical Exam:  Vital signs: BP 112/70 (BP Location: Left Arm, Patient Position: Sitting, Cuff Size: Normal)   Pulse 84   Temp (!) 97 F (36.1 C)  Ht 5' (1.524 m)   Wt 184 lb 2 oz (83.5 kg)   SpO2 99%   BMI 35.96 kg/m   Constitutional:   Pleasant female appears to be in NAD, Well developed, Well nourished, alert and cooperative Respiratory: Respirations even and unlabored. Lungs clear to auscultation bilaterally.   No wheezes, crackles, or rhonchi.  Cardiovascular: Normal S1, S2. No MRG. Regular rate and rhythm. No peripheral edema, cyanosis or pallor.  Gastrointestinal:  Soft, nondistended, nontender. No rebound or guarding. Normal bowel sounds. No appreciable masses or hepatomegaly. Rectal: External: external hemorrhoid, no ttp, non-thrombosed; Internal: no mass or lesion Psychiatric: Demonstrates good judgement and reason without abnormal affect or behaviors.  Assessment: 1.  Hemorrhoids: External and prolapsing internal 2.  Ulcerative proctitis: Currently under control with patient takes her Lialda 4.8 g daily  Plan: 1.  We will update labs to continue Lialda with a CBC and CMP today. 2.  Refilled Lialda 4.8 g daily with a 90-day prescription, 3 refills. 3.  Prescribed Hydrocortisone cream 2.5% to be applied externally twice a day as well as to a Preparation H suppository and inserted twice a day x1 to 2 weeks. 4.  Discussed hemorrhoids with the patient in detail.  Answered her questions in regards to banding, etc.  At this time would recommend that we trial Hydrocortisone as she has never done this before, if this does not work then we can discuss further things like banding or referral to surgery. 5.  Patient to follow in clinic with [place] as needed in the future.  Of note would likely need to repeat colonoscopy prior to banding procedure.  Cheryl Newer, PA-C [facility] 08/05/2019, 11:03 AM  Cc: Burnis Medin, MD

## 2019-08-05 NOTE — Telephone Encounter (Signed)
Left message for pt to call back.  Pt states she is taking prednisone.   Also reports the Lialda was going to cost her 600 out of pocket, needs something cheaper.

## 2019-08-06 NOTE — Telephone Encounter (Signed)
Spoke with pt and she will call her insurance company to find out what med they will cover for her and then call us back.

## 2019-08-06 NOTE — Telephone Encounter (Signed)
Prednisone can be what is elevating her WBC count.    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  Thanks-JLL

## 2019-08-07 NOTE — Progress Notes (Signed)
Reviewed and agree with documentation and assessment and plan. K. Veena Aasiya Creasey , MD   

## 2019-08-09 NOTE — Progress Notes (Signed)
Chief Complaint  Patient presents with  . Hospitalization Follow-up    HPI: Cheryl Walters 29 y.o. come in for fu ? ed visit for  Feve101+r  and sore thriat t  That  Had her decide to go to ed    Strep neg  eval neg x WBC elevated  rx with prednisone and sx relie f felt to be poss viral infection  Throat pain was initially on Left and now on right  mildy present  Had ov   Gi about hemorrhoudse and controlled   ulcerative proctitis  On march 23  Wbc still up  On pred ( had 5-6 days)  ROS: See pertinent positives and negatives per HPI. Past jx pf cpvod 19 in December 2020 HAs were worse until recent  Taste and smell still not back to baseline)  Tender lump ant neck getting better   Past Medical History:  Diagnosis Date  . Acne   . Active preterm labor 10/23/2016  . Headache(784.0)   . Irregular periods   . Pilonidal cyst     Family History  Problem Relation Age of Onset  . Other Mother        irreg periods  . Other Sister        irreg periods  . Sleep apnea Father        on machine  . Breast cancer Maternal Aunt   . Brain cancer Paternal Aunt   . Colon cancer Neg Hx   . Throat cancer Neg Hx   . Kidney disease Neg Hx   . Liver disease Neg Hx   . Diabetes Neg Hx   . Heart disease Neg Hx   . Stomach cancer Neg Hx     Social History   Socioeconomic History  . Marital status: Married    Spouse name: Not on file  . Number of children: Not on file  . Years of education: Not on file  . Highest education level: Not on file  Occupational History  . Not on file  Tobacco Use  . Smoking status: Never Smoker  . Smokeless tobacco: Never Used  Substance and Sexual Activity  . Alcohol use: Yes    Alcohol/week: 0.0 standard drinks    Comment: rarely  . Drug use: No  . Sexual activity: Not on file  Other Topics Concern  . 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 Difficulty of Paying Living Expenses:   Food Insecurity:   . Worried About Charity fundraiser in the Last Year:   . Arboriculturist in the Last Year:   Transportation Needs:   . Film/video editor (Medical):   Marland Kitchen Lack of Transportation (Non-Medical):   Physical Activity:   .  Days of Exercise per Week:   . Minutes of Exercise per Session:   Stress:   . Feeling of Stress :   Social Connections:   . Frequency of Communication with Friends and Family:   . Frequency of Social Gatherings with Friends and Family:   . Attends Religious Services:   . Active Member of Clubs or Organizations:   . Attends Archivist Meetings:   Marland Kitchen Marital Status:     Outpatient Medications Prior to Visit  Medication Sig Dispense Refill  . hydrocortisone (ANUSOL-HC) 2.5 % rectal cream Place 1 application rectally 2 (two) times daily for 14 days. (Patient not taking: Reported on 08/10/2019) 30 g 0  . mesalamine (LIALDA) 1.2 g EC tablet Take 4 tablets (4.8 g total) by mouth daily with breakfast. (Patient not taking: Reported on 08/10/2019) 120 tablet 3  . acetaminophen (TYLENOL) 500 MG tablet Take 500 mg by mouth every 6 (six) hours as needed.    Marland Kitchen aspirin-acetaminophen-caffeine (EXCEDRIN MIGRAINE) 250-250-65 MG tablet Take by mouth every 6 (six) hours as needed for headache.    . benzonatate (TESSALON) 100 MG capsule Take 1 capsule (100 mg total) by mouth every 8 (eight) hours. 21 capsule 0  . ondansetron (ZOFRAN ODT) 4 MG disintegrating tablet Take 1 tablet (4 mg total) by mouth every 8 (eight) hours as needed for  nausea or vomiting. 20 tablet 0  . predniSONE (STERAPRED UNI-PAK 21 TAB) 10 MG (21) TBPK tablet Take 6 tabs (53m) day 1, 5 tabs (544m day 2, 4 tabs (4067mday 3, 3 tabs (70m36may 4, 2 tabs (20mg90my 5, and 1 tab (10mg)24m 6. 21 tablet 0   No facility-administered medications prior to visit.     EXAM:  BP 120/80   Pulse 75   Temp 98.1 F (36.7 C) (Other (Comment))   Ht 5' (1.524 m)   Wt 184 lb (83.5 kg)   SpO2 95%   BMI 35.94 kg/m   Body mass index is 35.94 kg/m.  GENERAL: vitals reviewed and listed above, alert, oriented, appears well hydrated and in no acute distress HEENT: atraumatic, conjunctiva  clear, no obvious abnormalities on inspection of external nose and ears tms cleer OP : no lesion edema or exudate  Tonsils  0-1 small airway   No edema or palatal redness  Nl speech  NECK: No  lyphmhadnopathy on inspection, almond sized cyst right paratracheal area  Min tender no redness  palpation  LUNGS:no resp  Sx    CV: HRRR, no clubbing cyanosis or  peripheral edema nl cap refill  abd no organomegaly  MS: moves all extremities without noticeable focal  abnormality PSYCH: pleasant and cooperative, no obvious depression or anxiety Lab Results  Component Value Date   WBC 15.4 (H) 08/05/2019   HGB 13.3 08/05/2019   HCT 41.1 08/05/2019   PLT 369.0 08/05/2019   GLUCOSE 121 (H) 08/05/2019   CHOL 192 12/03/2007   TRIG 37 12/03/2007   HDL 83.2 12/03/2007   LDLCALC 101 (H) 12/03/2007   ALT 15 08/05/2019   AST 12 08/05/2019   NA 136 08/05/2019   K 3.7 08/05/2019   CL 102 08/05/2019   CREATININE 0.82 08/05/2019   BUN 12 08/05/2019   CO2 28 08/05/2019   TSH 2.502 06/11/2014   HGBA1C 5.3 06/11/2014   BP Readings from Last 3 Encounters:  08/10/19 120/80  08/05/19 112/70  08/02/19 132/81    ASSESSMENT AND PLAN:  Discussed the following assessment and plan:  Leukocytosis, unspecified type - Plan: CBC with Differential/Platelet  Throat infection - almost resolved   see text - Plan: CBC with Differential/Platelet Sore throat just about gone  And was bilateral tonsil area  Strep neg but acute at onset with fever for 1-2 days  And now just about resolve with minimal sx  Off prednisone for about 3+ days and no relapsing worsening sx  Sp suspect viral    Will get f/u cbcdiff  In 1 week or more   Or as needed.  To contact us if relapsing sx   Consider further  Work up  Empiric antibiotic She will investigate options  herwith insurance and Gi team about cost of her lialda interesting HA better this week "after chiro" but other factors possible Small tender lump neck seems skin related prob cyst   Plan to observe.  Keep appt for HA help and management because these were so severe and frequent in past -Patient advised to return or notify health care team  if  new concerns arise. Time review ED visit lab and Gi appt counseling   And management  Plan 30 minutes  Patient Instructions   This is probably a viral .  Throat infection  That should continue to resolve   Please  Get lab repeat [facility]diff at [facility] next week some time .  You WBC may have been elevated from the prednisone .   If relapsing sx of fever increasing sore throat  ( without  cough congestion)  Let us know .     Standley Brooking. Haden Cavenaugh M.D.

## 2019-08-10 ENCOUNTER — Ambulatory Visit (INDEPENDENT_AMBULATORY_CARE_PROVIDER_SITE_OTHER): Payer: 59 | Admitting: Internal Medicine

## 2019-08-10 ENCOUNTER — Encounter: Payer: Self-pay | Admitting: Internal Medicine

## 2019-08-10 ENCOUNTER — Other Ambulatory Visit: Payer: Self-pay

## 2019-08-10 VITALS — BP 120/80 | HR 75 | Temp 98.1°F | Ht 60.0 in | Wt 184.0 lb

## 2019-08-10 DIAGNOSIS — D72829 Elevated white blood cell count, unspecified: Secondary | ICD-10-CM | POA: Diagnosis not present

## 2019-08-10 DIAGNOSIS — J029 Acute pharyngitis, unspecified: Secondary | ICD-10-CM | POA: Diagnosis not present

## 2019-08-10 NOTE — Patient Instructions (Addendum)
  This is probably a viral .  Throat infection  That should continue to resolve   Please  Get lab repeat CBCdiff at the [facility] lab next week some time .  You WBC may have been elevated from the prednisone .   If relapsing sx of fever increasing sore throat  ( without  cough congestion)  Let us know .

## 2019-08-20 ENCOUNTER — Encounter: Payer: Self-pay | Admitting: Neurology

## 2019-08-20 ENCOUNTER — Other Ambulatory Visit: Payer: Self-pay

## 2019-08-20 ENCOUNTER — Ambulatory Visit (INDEPENDENT_AMBULATORY_CARE_PROVIDER_SITE_OTHER): Payer: 59 | Admitting: Neurology

## 2019-08-20 VITALS — BP 112/76 | HR 89 | Ht 60.0 in | Wt 183.2 lb

## 2019-08-20 DIAGNOSIS — G43009 Migraine without aura, not intractable, without status migrainosus: Secondary | ICD-10-CM

## 2019-08-20 MED ORDER — RIZATRIPTAN BENZOATE 10 MG PO TABS: ORAL_TABLET | ORAL | 3 refills | Status: DC

## 2019-08-20 MED ORDER — NORTRIPTYLINE HCL 10 MG PO CAPS: 10.0000 mg | ORAL_CAPSULE | Freq: Every day | ORAL | 3 refills | Status: DC

## 2019-08-20 NOTE — Patient Instructions (Signed)
1.  Start nortriptyline 91m at bedtime. 2.  At earliest onset of headache, take rizatriptan 18m  May repeat in 2 hours if needed (maximum 2 tablets in 24 hours).  Alternatively, you may use Excedrin but limit use of pain relievers to no more than 2 days out of week to prevent risk of rebound or medication-overuse headache. 3.  Keep headache diary 4.  Follow sleep instructions 5.  Follow up in 4 months.   Migraine Headache A migraine headache is a very strong throbbing pain on one side or both sides of your head. This type of headache can also cause other symptoms. It can last from 4 hours to 3 days. Talk with your doctor about what things may bring on (trigger) this condition. What are the causes? The exact cause of this condition is not known. This condition may be triggered or caused by:  Drinking alcohol.  Smoking.  Taking medicines, such as: ? Medicine used to treat chest pain (nitroglycerin). ? Birth control pills. ? Estrogen. ? Some blood pressure medicines.  Eating or drinking certain products.  Doing physical activity. Other things that may trigger a migraine headache include:  Having a menstrual period.  Pregnancy.  Hunger.  Stress.  Not getting enough sleep or getting too much sleep.  Weather changes.  Tiredness (fatigue). What increases the risk?  Being [id number] old.  Being female.  Having a family history of migraine headaches.  Being Caucasian.  Having depression or anxiety.  Being very overweight. What are the signs or symptoms?  A throbbing pain. This pain may: ? Happen in any area of the head, such as on one side or both sides. ? Make it hard to do daily activities. ? Get worse with physical activity. ? Get worse around bright lights or loud noises.  Other symptoms may include: ? Feeling sick to your stomach (nauseous). ? Vomiting. ? Dizziness. ? Being sensitive to bright lights, loud noises, or smells.  Before you get a  migraine headache, you may get warning signs (an aura). An aura may include: ? Seeing flashing lights or having blind spots. ? Seeing bright spots, halos, or zigzag lines. ? Having tunnel vision or blurred vision. ? Having numbness or a tingling feeling. ? Having trouble talking. ? Having weak muscles.  Some people have symptoms after a migraine headache (postdromal phase), such as: ? Tiredness. ? Trouble thinking (concentrating). How is this treated?  Taking medicines that: ? Relieve pain. ? Relieve the feeling of being sick to your stomach. ? Prevent migraine headaches.  Treatment may also include: ? Having acupuncture. ? Avoiding foods that bring on migraine headaches. ? Learning ways to control your body functions (biofeedback). ? Therapy to help you know and deal with negative thoughts (cognitive behavioral therapy). Follow these instructions at home: Medicines  Take over-the-counter and prescription medicines only as told by your doctor.  Ask your doctor if the medicine prescribed to you: ? Requires you to avoid driving or using heavy machinery. ? Can cause trouble pooping (constipation). You may need to take these steps to prevent or treat trouble pooping:  Drink enough fluid to keep your pee (urine) pale yellow.  Take over-the-counter or prescription medicines.  Eat foods that are high in fiber. These include beans, whole grains, and fresh fruits and vegetables.  Limit foods that are high in fat and sugar. These include fried or sweet foods. Lifestyle  Do not drink alcohol.  Do not use any products that contain nicotine or  tobacco, such as cigarettes, e-cigarettes, and chewing tobacco. If you need help quitting, ask your doctor.  Get at least 8 hours of sleep every night.  Limit and deal with stress. General instructions      Keep a journal to find out what may bring on your migraine headaches. For example, write down: ? What you eat and drink. ? How  much sleep you get. ? Any change in what you eat or drink. ? Any change in your medicines.  If you have a migraine headache: ? Avoid things that make your symptoms worse, such as bright lights. ? It may help to lie down in a dark, quiet room. ? Do not drive or use heavy machinery. ? Ask your doctor what activities are safe for you.  Keep all follow-up visits as told by your doctor. This is important. Contact a doctor if:  You get a migraine headache that is different or worse than others you have had.  You have more than 15 headache days in one month. Get help right away if:  Your migraine headache gets very bad.  Your migraine headache lasts longer than 72 hours.  You have a fever.  You have a stiff neck.  You have trouble seeing.  Your muscles feel weak or like you cannot control them.  You start to lose your balance a lot.  You start to have trouble walking.  You pass out (faint).  You have a seizure. Summary  A migraine headache is a very strong throbbing pain on one side or both sides of your head. These headaches can also cause other symptoms.  This condition may be treated with medicines and changes to your lifestyle.  Keep a journal to find out what may bring on your migraine headaches.  Contact a doctor if you get a migraine headache that is different or worse than others you have had.  Contact your doctor if you have more than 15 headache days in a month. This information is not intended to replace advice given to you by your health care provider. Make sure you discuss any questions you have with your health care provider. Document Revised: 08/22/2018 Document Reviewed: 06/12/2018 Elsevier Patient Education  [place].

## 2019-08-20 NOTE — Progress Notes (Signed)
NEUROLOGY CONSULTATION NOTE  Cheryl Walters MRN: [id number] DOB: 1990-08-17  Referring provider: Shanon Ace, MD Primary care provider: Shanon Ace, MD  Reason for consult:  headache  HISTORY OF PRESENT ILLNESS: Cheryl Walters is a 29 year old female who presents for headache.  History supplemented by PCP note.  Onset:  Her early to mid 44s Location:  holocephalic Quality:  pounding Intensity:  Moderate to severe.  She denies new headache, thunderclap headache  Aura:  none Premonitory Phase:  none Postdrome:  none Associated symptoms:  When severe, there is photophobia, phonophobia and nausea but denies associated visual disturbance or unilateral numbness or weakness. Duration:  30 minutes with Excedrin to all day Frequency:  Daily headache (2 she would classify as migraine/severe) Frequency of abortive medication: Excedrin daily Triggers:  When she feels hot Relieving factors:  Excedrin Activity:  Cannot function with severe migraines However, she went to the chiropractor 2 weeks ago and has since only had one headache in the past 2 weeks.  It lasted all day.  Labs from 08/05/2019 showed elevated WBC of 15.4 on CBC and CMP showed elevated glucose of 121 but she was on prednisone.   Current NSAIDS:  none Current analgesics:  Excedrin Current triptans:  none Current ergotamine:  none Current anti-emetic:  none Current muscle relaxants:  none Current anti-anxiolytic:  none Current sleep aide:  none Current Antihypertensive medications:  none Current Antidepressant medications:  none Current Anticonvulsant medications:  none Current anti-CGRP:  none Current Vitamins/Herbal/Supplements:  none Current Antihistamines/Decongestants:  none Other therapy:  none Hormone/birth control:  Mirena  Past NSAIDS:  Naproxen; ibuprofen Past analgesics:  Tylenol Past abortive triptans:  Sumatriptan 114m Past abortive ergotamine:  none Past muscle relaxants:  none Past  anti-emetic:  Zofran ODT 473mPast antihypertensive medications:  none Past antidepressant medications:  none Past anticonvulsant medications:  topiramate 5041mID (ineffective) Past anti-CGRP:  none Past vitamins/Herbal/Supplements:  none Past antihistamines/decongestants:  none Other past therapies:  none  Caffeine:  Coffee 1 to 2 times a week.  1 Coke daily Diet:  Drinks 32 oz water daily.  1 Coke daily.  May skip breakfast if she is not hungry. Exercise:  no Depression:  no; Anxiety:  no Other pain:  no Sleep:  Poor.  Difficulty falling asleep.  5-6 hours a night. Family history of headache:  Mom (migraines)  PAST MEDICAL HISTORY: Past Medical History:  Diagnosis Date  . Acne   . Active preterm labor 10/23/2016  . Headache(784.0)   . Irregular periods   . Pilonidal cyst     PAST SURGICAL HISTORY: Past Surgical History:  Procedure Laterality Date  . BIRTH CONTROL IMPLANT    . CESAREAN SECTION      MEDICATIONS: Current Outpatient Medications on File Prior to Visit  Medication Sig Dispense Refill  . mesalamine (LIALDA) 1.2 g EC tablet Take 4 tablets (4.8 g total) by mouth daily with breakfast. (Patient not taking: Reported on 08/10/2019) 120 tablet 3   No current facility-administered medications on file prior to visit.    ALLERGIES: No Known Allergies  FAMILY HISTORY: Family History  Problem Relation Age of Onset  . Other Mother        irreg periods  . Other Sister        irreg periods  . Sleep apnea Father        on machine  . Breast cancer Maternal Aunt   . Brain cancer Paternal Aunt   . Colon cancer  Neg Hx   . Throat cancer Neg Hx   . Kidney disease Neg Hx   . Liver disease Neg Hx   . Diabetes Neg Hx   . Heart disease Neg Hx   . Stomach cancer Neg Hx    SOCIAL HISTORY: Social History   Socioeconomic History  . Marital status: Married    Spouse name: Not on file  . Number of children: Not on file  . Years of education: Not on file  . Highest  education level: Not on file  Occupational History  . Not on file  Tobacco Use  . Smoking status: Never Smoker  . Smokeless tobacco: Never Used  Substance and Sexual Activity  . Alcohol use: Yes    Alcohol/week: 0.0 standard drinks    Comment: rarely  . Drug use: No  . Sexual activity: Not on file  Other Topics Concern  . 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 Difficulty of Paying Living Expenses:   Food Insecurity:   . Worried About Charity fundraiser in the Last Year:   . Arboriculturist in the Last Year:   Transportation Needs:   . Film/video editor (Medical):   Marland Kitchen Lack of Transportation (Non-Medical):   Physical Activity:   . Days of Exercise per Week:   . Minutes of Exercise per Session:   Stress:   . Feeling of Stress :   Social Connections:   . Frequency of Communication with Friends and Family:   . Frequency of Social Gatherings with Friends and Family:   . Attends Religious Services:   . Active Member of Clubs or Organizations:   . Attends Archivist Meetings:   Marland Kitchen Marital Status:   Intimate Partner Violence:   . Fear of Current or Ex-Partner:   . Emotionally Abused:   Marland Kitchen Physically Abused:   . Sexually Abused:     PHYSICAL EXAM: Blood pressure 112/76, pulse 89, height 5' (1.524 m), weight 183 lb 3.2 oz (83.1 kg), SpO2 99 %. General: No acute distress.  Patient appears well-groomed.  Head:  Normocephalic/atraumatic Eyes:  fundi examined but not visualized  Neck: supple, no paraspinal tenderness, full range of motion Back: No paraspinal tenderness Heart: regular rate and rhythm Lungs: Clear to auscultation bilaterally. Vascular: No carotid bruits. Neurological Exam: Mental status: alert and oriented to person, place, and time, recent and remote memory intact, fund of knowledge intact, attention and concentration intact, speech fluent and not dysarthric, language intact. Cranial nerves: CN I: not tested CN II: pupils equal, round and reactive to light, visual fields intact CN III, IV, VI:  full range of motion, no nystagmus, no ptosis CN V: facial sensation intact CN VII: upper  and lower face symmetric CN VIII: hearing intact CN IX, X: gag intact, uvula midline CN XI: sternocleidomastoid and trapezius muscles intact CN XII: tongue midline Bulk & Tone: normal, no fasciculations. Motor:  5/5 throughout  Sensation: temperature and vibration sensation intact.. Deep Tendon Reflexes:  2+ throughout, toes downgoing.  Finger to nose testing:  Without dysmetria.   Heel to shin:  Without dysmetria.  Gait:  Normal station and stride.  Romberg negative.  IMPRESSION: Migraine without aura, without status migrainosus, not intractable.  She was previously having chronic migraines complicated by medication overuse, but has since remarkably improved after a chiropractic adjustment  PLAN: 1.  She would still like to start a preventative, nortriptyline 76m at bedtime, to potentially prevent a recurrence of headaches and to help her sleep. 2.  For abortive therapy, rizatriptan.  She may use Excedrin if needed (as she is no longer overusing it) but instructed her to limit use of pain relievers to no more than 2 days out of week to prevent risk of rebound or medication-overuse headache. 3.  Keep headache diary 4.  Exercise, hydration, caffeine cessation, sleep hygiene, monitor for and avoid triggers 5.  Follow up 4 months.   Thank you for allowing me to  take part in the care of this patient.  AMetta Clines DO  CC: WShanon Ace MD

## 2019-09-08 ENCOUNTER — Other Ambulatory Visit: Payer: Self-pay

## 2019-09-08 NOTE — Progress Notes (Signed)
This visit occurred during the SARS-CoV-2 public health emergency.  Safety protocols were in place, including screening questions prior to the visit, additional usage of staff PPE, and extensive cleaning of exam room while observing appropriate contact time as indicated for disinfecting solutions.    Chief Complaint  Patient presents with  . Mass    lump on neck is still there  . Shoulder Pain    hard to raise left arm due to shoulder pain, going on for 1 week, right shoulder also started hurting 2 weeks ago    HPI: Cheryl Walters 29 y.o. come in for  Had  throat infection in  March and noted to  Have a almond zied lupm  Felt cysts right paratracheal  area   And told to observe  Still there but no longer tender and not bothering her .  Right shoulder  More than left for 2-3 weeks and hard to lift right arm above  No injury but has been shooting baskets b ball with kids   Also lifts 40 # child .  Is right handed .   No previous issues with shoudlers noted   ROS: See pertinent positives and negatives per HPI. No fever illness  Past Medical History:  Diagnosis Date  . Acne   . Active preterm labor 10/23/2016  . Headache(784.0)   . Irregular periods   . Pilonidal cyst     Family History  Problem Relation Age of Onset  . Other Mother        irreg periods  . Other Sister        irreg periods  . Sleep apnea Father        on machine  . Breast cancer Maternal Aunt   . Brain cancer Paternal Aunt   . Colon cancer Neg Hx   . Throat cancer Neg Hx   . Kidney disease Neg Hx   . Liver disease Neg Hx   . Diabetes Neg Hx   . Heart disease Neg Hx   . Stomach cancer Neg Hx     Social History   Socioeconomic History  . Marital status: Married    Spouse name: Not on file  . Number of children: Not on file  . Years of education: Not on file  . Highest education level: Not on file  Occupational History  . Not on file  Tobacco Use  . Smoking status: Never Smoker  . Smokeless  tobacco: Never Used  Substance and Sexual Activity  . Alcohol use: Yes    Alcohol/week: 0.0 standard drinks    Comment: rarely  . Drug use: No  . Sexual activity: Not on file  Other Topics Concern  . Not on file  Social History Narrative   [place]    Was Living at home    [facility]   Played basketball in HS no CV pulm signs   Childbirth in January 2012   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[employer]   Not working out of home at this time    NOw living iwht in Pharmacist, hospital and 4 years okd husband    Small quarters at home all day with 31 yo    To go to pre k if gets accepted  Neg  ocass etoh         Social Determinants of Radio broadcast assistant  Strain:   . Difficulty of Paying Living Expenses:   Food Insecurity:   . Worried About Charity fundraiser in the Last Year:   . Arboriculturist in the Last Year:   Transportation Needs:   . Film/video editor (Medical):   Marland Kitchen Lack of Transportation (Non-Medical):   Physical Activity:   . Days of Exercise per Week:   . Minutes of Exercise per Session:   Stress:   . Feeling of Stress :   Social Connections:   . Frequency of Communication with Friends and Family:   . Frequency of Social Gatherings with Friends and Family:   . Attends Religious Services:   . Active Member of Clubs or Organizations:   . Attends Archivist Meetings:   Marland Kitchen Marital Status:     Outpatient Medications Prior to Visit  Medication Sig Dispense Refill  . levonorgestrel (MIRENA) 20 MCG/24HR IUD 1 each by Intrauterine route once.    . mesalamine (LIALDA) 1.2 g EC tablet Take 4 tablets (4.8 g total) by mouth daily with breakfast. (Patient not taking: Reported on 09/09/2019) 120 tablet 3  . nortriptyline (PAMELOR) 10 MG capsule Take 1 capsule (10 mg total) by mouth at bedtime. (Patient not taking: Reported on 09/09/2019) 30 capsule 3  . rizatriptan (MAXALT) 10 MG tablet Take 1 tablet  earliest onset of headache.  May repeat in 2 hours if needed.  Maximum 2 tablets in 24 hours (Patient not taking: Reported on 09/09/2019) 10 tablet 3   No facility-administered medications prior to visit.     EXAM:  BP 118/74   Pulse 94   Temp 98.3 F (36.8 C) (Temporal)   Ht 5' (1.524 m)   Wt 181 lb 12.8 oz (82.5 kg)   SpO2 98%   BMI 35.51 kg/m   Body mass index is 35.51 kg/m.  GENERAL: vitals reviewed and listed above, alert, oriented, appears well hydrated and in no acute distress HEENT: atraumatic, conjunctiva  clear, no obvious abnormalities on inspection of external nose and ears OP :masked  NECK: no obvious masses on inspection palpation   Right paratracheal  Pea sized bb type  Skin nodule non tender  No adenopathy  MS: moves all extremities without noticeable focal  Abnormality righg shoulder dec rom elevation rotation  Anterior  No atrophy  PSYCH: pleasant and cooperative, no obvious depression or anxiety  BP Readings from Last 3 Encounters:  09/09/19 118/74  08/20/19 112/76  08/10/19 120/80    ASSESSMENT AND PLAN:  Discussed the following assessment and plan:  Bilateral shoulder pain, unspecified chronicity - Plan: Ambulatory referral to Sports Medicine  Subcutaneous nodule of neck New onset  Right more than left  prob overuse  St shoulder   Disc options will refer to SM ice after activity NSAID as possible avoid exacerbating  Motions  But ok rom in interim  Return if symptoms worsen or fail to improve.  As expected  In interim    -Patient advised to return or notify health care team  if  new concerns arise.  Patient Instructions    Shoulder seems like overuse tendinitis or such.ice  antiinflammatory such as advil aleve and referral  To sport medicine  Someone will contact you about appt.  The  Area on neck seems like a skin cyst and seems smaller than  before so continue to observe  Will prob be able to feel  Long  term . Fu if getting larger of  Tender  Shoulder Pain Many things can cause shoulder pain, including:  An injury to the shoulder.  Overuse of the shoulder.  Arthritis. The source of the pain can be:  Inflammation.  An injury to the shoulder joint.  An injury to a tendon, ligament, or bone. Follow these instructions at home: Pay attention to changes in your symptoms. Let your health care provider know about them. Follow these instructions to relieve your pain. If you have a sling:  Wear the sling as told by your health care provider. Remove it only as told by your health care provider.  Loosen the sling if your fingers tingle, become numb, or turn cold and blue.  Keep the sling clean.  If the sling is not waterproof: ? Do not let it get wet. Remove it to shower or bathe.  Move your arm as little as possible, but keep your hand moving to prevent swelling. Managing pain, stiffness, and swelling   If directed, put ice on the painful area: ? Put ice in a plastic bag. ? Place a towel between your skin and the bag. ? Leave the ice on for 20 minutes, 2-3 times per day. Stop applying ice if it does not help with the pain.  Squeeze a soft ball or a foam pad as much as possible. This helps to keep the shoulder from swelling. It also helps to strengthen the arm. General instructions  Take over-the-counter and prescription medicines only as told by your health care provider.  Keep all follow-up visits as told by your health care provider. This is important. Contact a health care provider if:  Your pain gets worse.  Your pain is not relieved with medicines.  New pain develops in your arm, hand, or fingers. Get help right away if:  Your arm, hand, or fingers: ? Tingle. ? Become numb. ? Become swollen. ? Become painful. ? Turn white or blue. Summary  Shoulder pain can be caused by an injury, overuse, or arthritis.  Pay attention to changes in your symptoms. Let your health care provider know about  them.  This condition may be treated with a sling, ice, and pain medicines.  Contact your health care provider if the pain gets worse or new pain develops. Get help right away if your arm, hand, or fingers tingle or become numb, swollen, or painful.  Keep all follow-up visits as told by your health care provider. This is important. This information is not intended to replace advice given to you by your health care provider. Make sure you discuss any questions you have with your health care provider. Document Revised: 11/12/2017 Document Reviewed: 11/12/2017 Elsevier Patient Education  2020 [place] Pearse Shiffler M.D.

## 2019-09-09 ENCOUNTER — Ambulatory Visit (INDEPENDENT_AMBULATORY_CARE_PROVIDER_SITE_OTHER): Payer: 59 | Admitting: Internal Medicine

## 2019-09-09 ENCOUNTER — Encounter: Payer: Self-pay | Admitting: Internal Medicine

## 2019-09-09 ENCOUNTER — Other Ambulatory Visit: Payer: Self-pay

## 2019-09-09 VITALS — BP 118/74 | HR 94 | Temp 98.3°F | Ht 60.0 in | Wt 181.8 lb

## 2019-09-09 DIAGNOSIS — M25511 Pain in right shoulder: Secondary | ICD-10-CM

## 2019-09-09 DIAGNOSIS — R221 Localized swelling, mass and lump, neck: Secondary | ICD-10-CM | POA: Diagnosis not present

## 2019-09-09 DIAGNOSIS — M25512 Pain in left shoulder: Secondary | ICD-10-CM

## 2019-09-09 NOTE — Patient Instructions (Addendum)
   Shoulder seems like overuse tendinitis or such.ice  antiinflammatory such as advil aleve and referral  To sport medicine  Someone will contact you about appt.  The  Area on neck seems like a skin cyst and seems smaller than  before so continue to observe  Will prob be able to feel  Long  term . Fu if getting larger of  Tender    Shoulder Pain Many things can cause shoulder pain, including:  An injury to the shoulder.  Overuse of the shoulder.  Arthritis. The source of the pain can be:  Inflammation.  An injury to the shoulder joint.  An injury to a tendon, ligament, or bone. Follow these instructions at home: Pay attention to changes in your symptoms. Let your health care provider know about them. Follow these instructions to relieve your pain. If you have a sling:  Wear the sling as told by your health care provider. Remove it only as told by your health care provider.  Loosen the sling if your fingers tingle, become numb, or turn cold and blue.  Keep the sling clean.  If the sling is not waterproof: ? Do not let it get wet. Remove it to shower or bathe.  Move your arm as little as possible, but keep your hand moving to prevent swelling. Managing pain, stiffness, and swelling   If directed, put ice on the painful area: ? Put ice in a plastic bag. ? Place a towel between your skin and the bag. ? Leave the ice on for 20 minutes, 2-3 times per day. Stop applying ice if it does not help with the pain.  Squeeze a soft ball or a foam pad as much as possible. This helps to keep the shoulder from swelling. It also helps to strengthen the arm. General instructions  Take over-the-counter and prescription medicines only as told by your health care provider.  Keep all follow-up visits as told by your health care provider. This is important. Contact a health care provider if:  Your pain gets worse.  Your pain is not relieved with medicines.  New pain develops in your  arm, hand, or fingers. Get help right away if:  Your arm, hand, or fingers: ? Tingle. ? Become numb. ? Become swollen. ? Become painful. ? Turn white or blue. Summary  Shoulder pain can be caused by an injury, overuse, or arthritis.  Pay attention to changes in your symptoms. Let your health care provider know about them.  This condition may be treated with a sling, ice, and pain medicines.  Contact your health care provider if the pain gets worse or new pain develops. Get help right away if your arm, hand, or fingers tingle or become numb, swollen, or painful.  Keep all follow-up visits as told by your health care provider. This is important. This information is not intended to replace advice given to you by your health care provider. Make sure you discuss any questions you have with your health care provider. Document Revised: 11/12/2017 Document Reviewed: 11/12/2017 Elsevier Patient Education  [place].

## 2019-09-16 ENCOUNTER — Encounter: Payer: Self-pay | Admitting: Family Medicine

## 2019-09-16 ENCOUNTER — Ambulatory Visit (INDEPENDENT_AMBULATORY_CARE_PROVIDER_SITE_OTHER): Payer: 59 | Admitting: Family Medicine

## 2019-09-16 ENCOUNTER — Other Ambulatory Visit: Payer: Self-pay

## 2019-09-16 ENCOUNTER — Ambulatory Visit: Payer: Self-pay

## 2019-09-16 VITALS — BP 110/84 | HR 85 | Ht 60.0 in | Wt 181.0 lb

## 2019-09-16 DIAGNOSIS — M25512 Pain in left shoulder: Secondary | ICD-10-CM | POA: Diagnosis not present

## 2019-09-16 DIAGNOSIS — M25511 Pain in right shoulder: Secondary | ICD-10-CM

## 2019-09-16 DIAGNOSIS — M7551 Bursitis of right shoulder: Secondary | ICD-10-CM

## 2019-09-16 NOTE — Progress Notes (Signed)
Subjective:    I'm seeing this patient as a consultation for:  Dr. Regis Bill. Note will be routed back to referring provider/PCP.  CC: B shoulder pain, R>L  I, Molly Weber, LAT, ATC, am serving as scribe for Dr. [name].  HPI: Pt is a 29 y/o female presenting w/ c/o B shoulder pain, R>L x 2-3 weeks w/ no specific MOI.  She notes that she has been doing a lot of basketball shooting and has a 40# child that she lifts and carries so these could be contributing factors.  Pain is over anterior shoulder pain with flexion and horizontal abduction. Denies any radiating symptoms. No history of shoulder issues. Has not tried any therapies for her shoulder. Pain is achy and stiff in the morning.   No pain radiating below the level of the upper arm.  No weakness or numbness distally.  No clicking or catching.  Right is worse than left.  She has not tried much treatment yet.  Past medical history, Surgical history, Family history, Social history, Allergies, and medications have been entered into the medical record, reviewed.  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  Review of Systems: No new headache, visual changes, nausea, vomiting, diarrhea, constipation, dizziness, abdominal pain, skin rash, fevers, chills, night sweats, weight loss, swollen lymph nodes, body aches, joint swelling, muscle aches, chest pain, shortness of breath, mood changes, visual or auditory hallucinations.   Objective:    Vitals:   09/16/19 1101  BP: 110/84  Pulse: 85  SpO2: 99%   General: Well Developed, well nourished, and in no acute distress.  Neuro/Psych: Alert and oriented x3, extra-ocular muscles intact, able to move all 4 extremities, sensation grossly intact. Skin: Warm and dry, no rashes noted.  Respiratory: Not using accessory muscles, speaking in full sentences, trachea midline.  Cardiovascular: Pulses palpable, no extremity edema. Abdomen: Does not appear distended. MSK: C-spine  normal-appearing nontender normal motion.  Upper extremity strength is intact reflexes and sensation are intact distally and equal bilaterally. Right shoulder normal-appearing nontender normal motion pain beyond 120 degrees of abduction. Range of motion intact. Strength intact. Negative Hawkins and Neer's test. Negative empty can test however some pain with the internal rotation component of this test. Negative Yergason's and speeds test. Left shoulder normal-appearing nontender. Normal motion. Negative impingement testing.  Negative biceps tendinitis testing.   Lab and Radiology Results  Diagnostic Limited MSK Ultrasound of: Right shoulder Of note: Quality ultrasound degraded by body habitus. Biceps tendon intact in bicipital groove. Subscapularis tendon normal. Supraspinatus tendon intact normal-appearing increased subacromial bursa thickness present. Infraspinatus tendon normal. AC joint normal-appearing Impression: Subacromial bursitis   Impression and Recommendations:    Assessment and Plan: 29 y.o. female with bilateral shoulder pain right worse than left ongoing for 2 to 3 weeks.  Precipitating event was a lot of overhead activity with basketball playing.  This is somewhat unusual for her.  Fortunately she does not have significant pain at work and is not disabled at this time. Exam was somewhat equivocal with negative impingement testing however ultrasound examination does reveal subacromial bursitis. Discussed options.  Plan for home exercise program as taught in clinic today by ATC and referral to physical therapy.  Recommend oral and topical NSAIDs.  Recheck back in 6 weeks.  If not better would consider injection versus x-ray MRI.  Patient is reluctant to consider injection at this time.Marland Kitchen  PDMP not reviewed this encounter. Orders Placed This Encounter  Procedures  .  [place] LIMITED JOINT SPACE STRUCTURES UP BILAT(NO LINKED CHARGES)    Order Specific Question:   Reason for  Exam (SYMPTOM  OR DIAGNOSIS REQUIRED)    Answer:   bl shoulder pain    Order Specific Question:   Preferred imaging location?    Answer:   [facility]  . Ambulatory referral to Physical Therapy    Referral Priority:   Routine    Referral Type:   Physical Medicine    Referral Reason:   Specialty Services Required    Requested Specialty:   Physical Therapy   No orders of the defined types were placed in this encounter.   Discussed warning signs or symptoms. Please see discharge instructions. Patient expresses understanding.   The above documentation has been reviewed and is accurate and complete [name]

## 2019-09-16 NOTE — Patient Instructions (Signed)
Thank you for coming in today. Ok to ibuprofen or aleve for pain.  Use voltaren gel up to 4x daily.  Do the home exercises.  Attend PT.   Recheck in 6 weeks especially if not better.  Let me know if you have a problem.

## 2019-09-22 ENCOUNTER — Encounter: Payer: Self-pay | Admitting: Family Medicine

## 2019-09-23 ENCOUNTER — Ambulatory Visit: Payer: 59 | Attending: Family Medicine | Admitting: Physical Therapy

## 2019-09-23 ENCOUNTER — Encounter: Payer: Self-pay | Admitting: Physical Therapy

## 2019-09-23 ENCOUNTER — Other Ambulatory Visit: Payer: Self-pay

## 2019-09-23 DIAGNOSIS — M25512 Pain in left shoulder: Secondary | ICD-10-CM | POA: Diagnosis present

## 2019-09-23 DIAGNOSIS — G8929 Other chronic pain: Secondary | ICD-10-CM | POA: Diagnosis present

## 2019-09-23 DIAGNOSIS — M25511 Pain in right shoulder: Secondary | ICD-10-CM | POA: Diagnosis present

## 2019-09-23 NOTE — Therapy (Signed)
[place], [place], [address] Phone: [phone]   Fax:  [phone]  Physical Therapy Evaluation  Patient Details  Name: Cheryl Walters MRN: [id number] Date of Birth: Feb 14, 1991 Referring Provider (PT): Gregor Hams, MD    Encounter Date: 09/23/2019  PT End of Session - 09/23/19 1329    Visit Number  1    Number of Visits  13    Date for PT Re-Evaluation  11/04/19    PT Start Time  1330    PT Stop Time  1414    PT Time Calculation (min)  44 min    Activity Tolerance  Patient tolerated treatment well    Behavior During Therapy  [facility] for tasks assessed/performed       Past Medical History:  Diagnosis Date  . Acne   . Active preterm labor 10/23/2016  . Headache(784.0)   . Irregular periods   . Pilonidal cyst     Past Surgical History:  Procedure Laterality Date  . BIRTH CONTROL IMPLANT    . CESAREAN SECTION      There were no vitals filed for this visit.   Subjective Assessment - 09/23/19 1334    Subjective  pt is a 29 y.o F with CC of R shoulder pain that started after playing basketball with her boys and bowling that started 4 weeks ago with no specific onset. She reports the L shoulder started bothering about a week after with no specific cause. She rpeorts no hx of shoulder issues, and denies any N/T or popping/ clicking. Since onset the pain inthe R shoulder seems to worse.    Limitations  Lifting    How long can you sit comfortably?  unlimited    How long can you stand comfortably?  unlimited    How long can you walk comfortably?  unlimited    Currently in Pain?  Yes    Pain Score  0-No pain   at worst 10/10   Pain Location  Shoulder    Pain Orientation  Right    Pain Descriptors / Indicators  Sharp;Aching    Pain Type  Chronic pain    Pain Onset  More than a month ago    Pain Frequency  Intermittent    Aggravating Factors   lifting above shoulder height, lifting/ carrying    Pain Relieving Factors  holding the arm in a place    Effect of Pain on Daily Activities  limited reaching/ lifting/carrying         [facility] PT Assessment - 09/23/19 1339      Assessment   Medical Diagnosis  Acute pain of both shoulders M25.511, M25.512    Referring Provider (PT)  Gregor Hams, MD     Onset Date/Surgical Date  --   4 weeks   Hand Dominance  Right    Next MD Visit  4-6 weeks    Prior Therapy  no      Precautions   Precautions  None      Restrictions   Weight Bearing Restrictions  No      Balance Screen   Has the patient fallen in the past 6 months  No    Has the patient had a decrease in activity level because of a fear of falling?   No    Is the patient reluctant to leave their home because of a fear of falling?   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 [employer] Requirements  sitting/ standing,     Leisure  basketball, bowling, playing with her boys      Cognition   Overall Cognitive Status  Within Functional Limits for tasks assessed      Observation/Other Assessments   Focus on Therapeutic Outcomes (FOTO)   not setup       ROM / Strength   AROM / PROM / Strength  AROM;Strength;PROM      AROM   AROM Assessment Site  Shoulder    Right/Left Shoulder  Right;Left    Right Shoulder Extension  90 Degrees   end range pain   Right Shoulder Flexion  140 Degrees   concrdant pain starting around 90 degrees and increases   Right Shoulder ABduction  120 Degrees   pain during motion    Right Shoulder Internal Rotation  --    T7   Right Shoulder External Rotation  --   T4   Left Shoulder Extension  73 Degrees    Left Shoulder Flexion  155 Degrees   end range pain   Left Shoulder ABduction  125 Degrees   end range pain    Left Shoulder Internal Rotation  --   T5   Left Shoulder External Rotation  --   T5     PROM   PROM Assessment Site  Shoulder      Strength   Strength Assessment Site  Shoulder    Right/Left Shoulder  Right;Left    Right Shoulder Flexion  4+/5    Right Shoulder Extension  5/5    Right Shoulder ABduction  4+/5    Right Shoulder Internal Rotation  5/5   pain during testing   Right Shoulder External Rotation  5/5    Left Shoulder Flexion  4/5    Left Shoulder Extension  5/5    Left Shoulder ABduction  4+/5    Left Shoulder Internal Rotation  5/5    Left Shoulder External Rotation  5/5      Palpation   Palpation comment  TTP along the A/C, and the sub-acromial space, mulitple trigger points noted in bil upper trap/ levataor scapuale.       Special Tests    Special Tests  Rotator Cuff Impingement    Rotator Cuff Impingment tests  Neer impingement test;Hawkins- Kennedy test;Painful Arc of Motion;other      Neer Impingement test    Findings  Positive    Side  Right      Hawkins-Kennedy test   Findings  Negative      Painful Arc of Motion   Findings  Positive    Side  Right      other   Findings  Positive    Comments  scapular assist                  Objective measurements completed on examination: See above findings.  [place] Adult PT Treatment/Exercise - 09/23/19 0001      Exercises   Exercises  Shoulder      Shoulder Exercises: Supine   Protraction  Both;15 reps      Shoulder Exercises: Stretch   Other Shoulder Stretches  upper trap/ levator scapuale 1 x 30 sec ea.      Manual Therapy   Manual Therapy  Scapular mobilization    Scapular Mobilization  upward rotation with pt actively flexing/ abducting shoulder             PT  Education - 09/23/19 1329    Education Details  evaluation findings, POC, goals, HEP with proper form/ rationale.    Person(s) Educated  Patient    Methods  Explanation;Verbal cues;Handout    Comprehension  Verbalized understanding;Verbal cues required       PT Short Term Goals - 09/23/19 1604      PT SHORT TERM GOAL #1   Title  pt to be I inital HEP    Time  3    Period  Weeks    Status  New    Target Date  10/14/19      PT SHORT TERM GOAL #2   Title  pt to verbalize / demo efficient posture and lifting mechanics to reduce and prevent shoulder pain    Time  3    Period  Weeks    Status  New    Target Date  10/14/19        PT Long Term Goals - 09/23/19 1605      PT LONG TERM GOAL #1   Title  pt to maintain shoulder AROM in all planes with no report of pain for improvement in condition    Time  6    Period  Weeks    Status  New    Target Date  11/04/19      PT LONG TERM GOAL #2   Title  pt to be able to lift and lower >/= 10# to and from overhead shelf and push/pull >/= 15# for functional strength required for ADLS and work related activities    Time  6    Period  Weeks    Status  New    Target Date  11/04/19      PT LONG TERM GOAL #3   Title  pt to be able to return to playing basketball and bowling with no report of pain or limitations for QOL    Time  6    Period  Weeks    Status  New    Target Date  11/04/19      PT LONG TERM GOAL #4   Title  pt to be I with all HEP given to maintain and progress current level of function    Time  6    Period  Weeks    Status  New    Target Date  11/04/19             Plan - 09/23/19 1559    Clinical Impression Statement  pt presents to OPPT witth CC of R and L shoulder pain starting 4 weeks ago with no specific MOI with R>L. She has functional AROM with pain noted at end ranges of flexion/ abduction, and funcitional strength with no pain noted during testing. Special testing postivie suggesting increased likelihood  of impingement. she reported reduction in pain reaching overhead following scapular stability exercise. She would benefit from physical therapy to decrease bil  shoulder pain, improve scapulohumeral rhythm and maximize overall function by addressing the deficits listed.    Stability/Clinical Decision Making  Stable/Uncomplicated    Clinical Decision Making  Low    Rehab Potential  Good    PT Frequency  2x / week    PT Duration  6 weeks    PT Treatment/Interventions  ADLs/Self Care Home Management;Cryotherapy;Electrical Stimulation;Iontophoresis 71m/ml Dexamethasone;Ultrasound;Moist Heat;Therapeutic activities;Therapeutic exercise;Neuromuscular re-education;Patient/family education;Manual techniques;Dry needling;Passive range of motion;Taping    PT Next Visit Plan  review/ update HEP PRN, scapular stability and GHJ strengthening, STW and stretching for upper trap/leator scapuale,    PT Home Exercise Plan  [id number] - upper trap/ levator scapulae stretching, shoulder IR/ER, rows, and money,    Consulted and Agree with Plan of Care  Patient       Patient will benefit from skilled therapeutic intervention in order to improve the following deficits and impairments:  Improper body mechanics, Increased muscle spasms, Pain, Decreased activity tolerance  Visit Diagnosis: Chronic right shoulder pain  Chronic left shoulder pain     Problem List Patient Active Problem List   Diagnosis Date Noted  . Abscess 05/23/2018  . IUD (intrauterine device)  09/04/2017  . Blood in stool 06/25/2014  . Acne vulgaris 06/25/2014  . 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 Muscle spasm of back 03/18/2013  . Chronic rhinitis 08/13/2011  . Recurrent headache 08/13/2011  . 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 CHEST PAIN 12/09/2008  . IRREGULAR MENSTRUAL CYCLE 07/31/2007  . ACNE VULGARIS 07/31/2007   KStarr LakePT, DPT, LAT, ATC  09/23/19  4:11 PM      [facility]  [facility]GSouth [place] [address]Phone: [phone]  Fax:  [phone] Name: Cheryl SANTOLIMRN: [id number] of Birth: 707-26-1992

## 2019-09-25 ENCOUNTER — Ambulatory Visit: Payer: 59 | Admitting: Neurology

## 2019-09-30 ENCOUNTER — Ambulatory Visit: Payer: 59 | Admitting: Physical Therapy

## 2019-09-30 ENCOUNTER — Encounter: Payer: Self-pay | Admitting: Physical Therapy

## 2019-09-30 ENCOUNTER — Other Ambulatory Visit: Payer: Self-pay

## 2019-09-30 DIAGNOSIS — M25511 Pain in right shoulder: Secondary | ICD-10-CM | POA: Diagnosis not present

## 2019-09-30 DIAGNOSIS — G8929 Other chronic pain: Secondary | ICD-10-CM

## 2019-09-30 NOTE — Therapy (Signed)
[place]heryl Walters, [place], [address] Phone: [phone]   Fax:  [phone]  Physical Therapy Treatment  Patient Details  Name: Cheryl Walters MRN: [id number] Date of Birth: 04-15-1991 Referring Provider (PT): Gregor Hams, MD    Encounter Date: 09/30/2019  PT End of Session - 09/30/19 1331    Visit Number  2    Number of Visits  13    Date for PT Re-Evaluation  11/04/19    PT Start Time  1331    PT Stop Time  1415    PT Time Calculation (min)  44 min    Activity Tolerance  Patient tolerated treatment well    Behavior During Therapy  [facility] for tasks assessed/performed       Past Medical History:  Diagnosis Date  . Acne   . Active preterm labor 10/23/2016  . Headache(784.0)   . Irregular periods   . Pilonidal cyst     Past Surgical History:  Procedure Laterality Date  . BIRTH CONTROL IMPLANT    . CESAREAN SECTION      There were no vitals filed for this visit.  Subjective Assessment - 09/30/19 1333    Subjective  " I was doing really good, up until yesterday when I was playing basketball I reached with my UE"    Currently in Pain?  Yes    Pain Score  6     Pain Location  Shoulder    Pain Orientation  Right    Pain Type  Chronic pain         [facility] PT Assessment - 09/30/19 0001      Assessment   Medical Diagnosis  Acute pain of both shoulders M25.511, M25.512    Referring Provider (PT)  Gregor Hams, MD                     [facility] Adult PT Treatment/Exercise - 09/30/19 0001      Shoulder Exercises: Supine   Protraction  Strengthening;Right;15 reps   x 2 sets followed with CW/CCW x 15 reps ea.   Flexion  12 reps;Strengthening;Right      Shoulder Exercises: Standing   Flexion  Strengthening;Right;12 reps   combined with upward scapular assist     Shoulder Exercises: ROM/Strengthening   UBE (Upper Arm Bike)  L1 x 4 min    fwd/bwd     Shoulder Exercises: Stretch    Other Shoulder Stretches  rhomboid stretch 2 x 30 sec    from the door   Other Shoulder Stretches  upper trap/ levator scapuale 1 x 30 sec ea.      Manual Therapy   Manual Therapy  Joint mobilization;Other (comment)    Manual therapy comments  MTPR along the R upper trap/ levator scapulae    Joint Mobilization  Distal clavicle mobs inferior/ posterior grade III    Scapular Mobilization  upward rotation with pt actively flexing/ abducting shoulder    Other Manual Therapy  R upper trap inhibition taping             PT Education - 09/30/19 1417    Education Details  reviewed HEp    Person(s) Educated  Patient    Methods  Explanation;Verbal cues    Comprehension  Verbalized understanding;Verbal cues required       PT Short Term Goals - 09/23/19 1604      PT SHORT TERM GOAL #1   Title  pt to  be I inital HEP    Time  3    Period  Weeks    Status  New    Target Date  10/14/19      PT SHORT TERM GOAL #2   Title  pt to verbalize / demo efficient posture and lifting mechanics to reduce and prevent shoulder pain    Time  3    Period  Weeks    Status  New    Target Date  10/14/19        PT Long Term Goals - 09/23/19 1605      PT LONG TERM GOAL #1   Title  pt to maintain shoulder AROM in all planes with no report of pain for improvement in condition    Time  6    Period  Weeks    Status  New    Target Date  11/04/19      PT LONG TERM GOAL #2   Title  pt to be able to lift and lower >/= 10# to and from overhead shelf and push/pull >/= 15# for functional strength required for ADLS and work related activities    Time  6    Period  Weeks    Status  New    Target Date  11/04/19      PT LONG TERM GOAL #3   Title  pt to be able to return to playing basketball and bowling with no report of pain or limitations for QOL    Time  6    Period  Weeks    Status  New    Target Date  11/04/19      PT LONG TERM GOAL #4   Title  pt to be I with all HEP given to maintain and  progress current level of function    Time  6    Period  Weeks    Status  New    Target Date  11/04/19            Plan - 09/30/19 1417    Clinical Impression Statement  pt reports consistency with her HEP and reports decreased pain until yesterday when she reached for a football and threw it pain was 6/10 today. she responded well to distal clavicle mobs and MTPR along the  Rupper trap/ levator scapulae. continued shoulder strengthening which she did well with and trialed inhibition taping for the R upper trap. end of session she reported no pain at rest with 6/10 pain ony at end range flexion.    PT Next Visit Plan  review/ update HEP PRN, scapular stability and GHJ strengthening, STW and stretching for upper trap/leator scapuale, update HEP    PT Home Exercise Plan  [id number] - upper trap/ levator scapulae stretching, shoulder IR/ER, rows, and money,    Consulted and Agree with Plan of Care  Patient       Patient will benefit from skilled therapeutic intervention in order to improve the following deficits and impairments:     Visit Diagnosis: Chronic right shoulder pain  Chronic left shoulder pain     Problem List Patient Active Problem List   Diagnosis Date Noted  . Abscess 05/23/2018  . IUD (intrauterine device)  09/04/2017  . Blood in stool 06/25/2014  . Acne vulgaris 06/25/2014  . 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 Muscle spasm of back 03/18/2013  . Chronic rhinitis 08/13/2011  . Recurrent headache 08/13/2011  . 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 CHEST PAIN 12/09/2008  .  IRREGULAR MENSTRUAL CYCLE 07/31/2007  . ACNE VULGARIS 07/31/2007   Starr Lake PT, DPT, LAT, ATC  09/30/19  2:20 PM      [facility] [address] [place], [place], [address] Phone: [phone]   Fax:  [phone]  Name: Cheryl Walters MRN: [id number] Date of Birth: 1990/05/29

## 2019-10-07 ENCOUNTER — Encounter (HOSPITAL_COMMUNITY): Payer: Self-pay | Admitting: *Deleted

## 2019-10-07 ENCOUNTER — Ambulatory Visit: Payer: 59 | Admitting: Physical Therapy

## 2019-10-07 ENCOUNTER — Other Ambulatory Visit: Payer: Self-pay

## 2019-10-07 ENCOUNTER — Emergency Department (HOSPITAL_COMMUNITY)
Admission: EM | Admit: 2019-10-07 | Discharge: 2019-10-08 | Disposition: A | Discharge status: Home / Self Care | Payer: 59 | Attending: Emergency Medicine | Admitting: Emergency Medicine

## 2019-10-07 ENCOUNTER — Encounter: Payer: Self-pay | Admitting: Physical Therapy

## 2019-10-07 DIAGNOSIS — R2232 Localized swelling, mass and lump, left upper limb: Secondary | ICD-10-CM | POA: Diagnosis not present

## 2019-10-07 DIAGNOSIS — G8929 Other chronic pain: Secondary | ICD-10-CM

## 2019-10-07 DIAGNOSIS — L539 Erythematous condition, unspecified: Secondary | ICD-10-CM | POA: Diagnosis present

## 2019-10-07 DIAGNOSIS — M25512 Pain in left shoulder: Secondary | ICD-10-CM

## 2019-10-07 NOTE — Therapy (Signed)
[place], [place], [address] Phone: [phone]   Fax:  [phone]  Physical Therapy Treatment  Patient Details  Name: Cheryl Walters MRN: [id number] Date of Birth: 1991/03/18 Referring Provider (PT): Gregor Hams, MD    Encounter Date: 10/07/2019  PT End of Session - 10/07/19 0931    Visit Number  3    Number of Visits  13    Date for PT Re-Evaluation  11/04/19    PT Start Time  0932    PT Stop Time  1011    PT Time Calculation (min)  39 min    Activity Tolerance  Patient tolerated treatment well    Behavior During Therapy  [facility] for tasks assessed/performed       Past Medical History:  Diagnosis Date  . Acne   . Active preterm labor 10/23/2016  . Headache(784.0)   . Irregular periods   . Pilonidal cyst     Past Surgical History:  Procedure Laterality Date  . BIRTH CONTROL IMPLANT    . CESAREAN SECTION      There were no vitals filed for this visit.  Subjective Assessment - 10/07/19 0933    Subjective  " I am dong better, no pain today. Still get some soreness with reaching but not as signifcant"    Currently in Pain?  No/denies         [facility] PT Assessment - 10/07/19 0001      Assessment   Medical Diagnosis  Acute pain of both shoulders M25.511, M25.512    Referring Provider (PT)  Gregor Hams, MD                     [facility] Adult PT Treatment/Exercise - 10/07/19 0001      Shoulder Exercises: Supine   Protraction  Strengthening;Both;20 reps;Weights    Protraction Weight (lbs)  3    Protraction Limitations  using dowel rod    Flexion  Strengthening;Both;15 reps;Weights   using dowel rod     Shoulder Exercises: Seated   Flexion  Strengthening;Right;15 reps;Weights   scaption position   Flexion Weight (lbs)  3      Shoulder Exercises: Sidelying   ABduction  Strengthening;Right;20 reps;Weights    ABduction Weight (lbs)  3      Shoulder Exercises: Standing    Other Standing Exercises  lower trap wall y's 1 x 20      Shoulder Exercises: Stretch   Other Shoulder Stretches  upper trap/ levator scapuale 1 x 30 sec ea.      Modalities   Modalities  Iontophoresis      Iontophoresis   Type of Iontophoresis  Dexamethasone    Location  R sub-acromial space    Dose  64m/ml      Manual Therapy   Manual therapy comments  MTPR along the R subclavius    Joint Mobilization  Distal clavicle mobs posterior grade III    Scapular Mobilization  with pt in L sidelying grade III in all planes except ifor elevation               PT Short Term Goals - 09/23/19 1604      PT SHORT TERM GOAL #1   Title  pt to be I inital HEP    Time  3    Period  Weeks    Status  New    Target Date  10/14/19      PT  SHORT TERM GOAL #2   Title  pt to verbalize / demo efficient posture and lifting mechanics to reduce and prevent shoulder pain    Time  3    Period  Weeks    Status  New    Target Date  10/14/19        PT Long Term Goals - 09/23/19 1605      PT LONG TERM GOAL #1   Title  pt to maintain shoulder AROM in all planes with no report of pain for improvement in condition    Time  6    Period  Weeks    Status  New    Target Date  11/04/19      PT LONG TERM GOAL #2   Title  pt to be able to lift and lower >/= 10# to and from overhead shelf and push/pull >/= 15# for functional strength required for ADLS and work related activities    Time  6    Period  Weeks    Status  New    Target Date  11/04/19      PT LONG TERM GOAL #3   Title  pt to be able to return to playing basketball and bowling with no report of pain or limitations for QOL    Time  6    Period  Weeks    Status  New    Target Date  11/04/19      PT LONG TERM GOAL #4   Title  pt to be I with all HEP given to maintain and progress current level of function    Time  6    Period  Weeks    Status  New    Target Date  11/04/19            Plan - 10/07/19 1009    Clinical  Impression Statement  pt states she feels she is getting better but does continue to report intermittent pain with reaching forward. continued working on scapualr and distal clavicle mobility. she responded well with shoulder strengthening overhead with no report of fatigue but no increase in pain. educated and pt consented to iontophoresis which was applied over the [facility] joint.    PT Next Visit Plan  review/ update HEP PRN, lifting and carrying activities, how was ionto? scapular stability and GHJ strengthening, STW and stretching for upper trap/leator scapuale,    PT Home Exercise Plan  [id number] - upper trap/ levator scapulae stretching, shoulder IR/ER, rows, and money,    Consulted and Agree with Plan of Care  Patient       Patient will benefit from skilled therapeutic intervention in order to improve the following deficits and impairments:     Visit Diagnosis: Chronic right shoulder pain  Chronic left shoulder pain     Problem List Patient Active Problem List   Diagnosis Date Noted  . Abscess 05/23/2018  . IUD (intrauterine device)  09/04/2017  . Blood in stool 06/25/2014  . Acne vulgaris 06/25/2014  . 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 Muscle spasm of back 03/18/2013  . Chronic rhinitis 08/13/2011  . Recurrent headache 08/13/2011  . 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 CHEST PAIN 12/09/2008  . IRREGULAR MENSTRUAL CYCLE 07/31/2007  . ACNE VULGARIS 07/31/2007   Starr Lake PT, DPT, LAT, ATC  10/07/19  10:12 AM      [facility] [address] [place], [place], [address] Phone: [phone]   Fax:  [phone]  Name: Cheryl Walters MRN: [id number] Date of Birth: 09/18/1990

## 2019-10-07 NOTE — Patient Instructions (Signed)

## 2019-10-07 NOTE — ED Triage Notes (Signed)
Left axilla abscess for 1 week, started draining today, denies fevers.

## 2019-10-08 MED ORDER — MUPIROCIN CALCIUM 2 % EX CREA: 1.0000 "application " | TOPICAL_CREAM | Freq: Two times a day (BID) | CUTANEOUS | 0 refills | Status: DC

## 2019-10-08 MED ORDER — CEPHALEXIN 500 MG PO CAPS: 500.0000 mg | ORAL_CAPSULE | Freq: Two times a day (BID) | ORAL | 0 refills | Status: AC

## 2019-10-08 NOTE — ED Notes (Signed)
Pt verbalized that abscess does not hurt if it is not touched.

## 2019-10-08 NOTE — ED Provider Notes (Signed)
[facility] EMERGENCY DEPARTMENT Provider Note   CSN: [id number] Arrival date & time: 10/07/19  2259    History Chief Complaint  Patient presents with  . Abscess    Cheryl Walters is a 29 y.o. female with no significant past medical history who presents for evaluation of possible abscess.  Patient states that she developed a wound to her left axilla 1 week ago.  Initially was not draining however states she feels like it is currently draining.  Denies fever, chills, nausea, vomiting, chest pain, shortness of breath, edema, erythema, warmth.  Denies aggravating relieving factors.  Tetanus up-to-date.  Pain 5/10 when she palpates the area however no pain when she is touching it.  History obtained from patient and past medical records.  No interpreter is used.  HPI     Past Medical History:  Diagnosis Date  . Acne   . Active preterm labor 10/23/2016  . Headache(784.0)   . Irregular periods   . Pilonidal cyst     Patient Active Problem List   Diagnosis Date Noted  . Abscess 05/23/2018  . IUD (intrauterine device)  09/04/2017  . Blood in stool 06/25/2014  . Acne vulgaris 06/25/2014  . 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 Muscle spasm of back 03/18/2013  . Chronic rhinitis 08/13/2011  . Recurrent headache 08/13/2011  . 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 CHEST PAIN 12/09/2008  . IRREGULAR MENSTRUAL CYCLE 07/31/2007  . ACNE VULGARIS 07/31/2007    Past Surgical History:  Procedure Laterality Date  . BIRTH CONTROL IMPLANT    . CESAREAN SECTION       OB History    Gravida  2   Para  2   Term  1   Preterm  1   AB      Living  2     SAB      TAB      Ectopic      Multiple  0   Live Births  1           Family History  Problem Relation Age of Onset  . Other Mother        irreg periods  . Other Sister        irreg periods  . Sleep apnea Father        on machine  . Breast cancer Maternal Aunt   . Brain cancer  Paternal Aunt   . Colon cancer Neg Hx   . Throat cancer Neg Hx   . Kidney disease Neg Hx   . Liver disease Neg Hx   . Diabetes Neg Hx   . Heart disease Neg Hx   . Stomach cancer Neg Hx     Social History   Tobacco Use  . Smoking status: Never Smoker  . Smokeless tobacco: Never Used  Substance Use Topics  . Alcohol use: Yes    Alcohol/week: 0.0 standard drinks    Comment: rarely  . Drug use: No    Home Medications Prior to Admission medications   Medication Sig Start Date End Date Taking? Authorizing Provider  cephALEXin (KEFLEX) 500 MG capsule Take 1 capsule (500 mg total) by mouth 2 (two) times daily for 5 days. 10/08/19 10/13/19  Henderly, Britni A, PA-C  levonorgestrel (MIRENA) 20 MCG/24HR IUD 1 each by Intrauterine route once.    [provider]  mesalamine (LIALDA) 1.2 g EC tablet Take 4 tablets (4.8 g total) by mouth daily with breakfast. Patient not taking: Reported  on 09/23/2019 08/05/19   Levin Erp, PA  mupirocin cream (BACTROBAN) 2 % Apply 1 application topically 2 (two) times daily. 10/08/19   Henderly, Britni A, PA-C  nortriptyline (PAMELOR) 10 MG capsule Take 1 capsule (10 mg total) by mouth at bedtime. Patient not taking: Reported on 09/23/2019 08/20/19   Pieter Partridge, DO  rizatriptan (MAXALT) 10 MG tablet Take 1 tablet earliest onset of headache.  May repeat in 2 hours if needed.  Maximum 2 tablets in 24 hours Patient not taking: Reported on 09/23/2019 08/20/19   Pieter Partridge, DO   Allergies    Patient has no known allergies.  Review of Systems   Review of Systems  Constitutional: Negative.   HENT: Negative.   Respiratory: Negative.   Cardiovascular: Negative.   Gastrointestinal: Negative.   Genitourinary: Negative.   Musculoskeletal: Negative.   Skin: Positive for wound.  Neurological: Negative.   All other systems reviewed and are negative.  Physical Exam Updated Vital Signs BP 118/75 (BP Location: Right Arm)   Pulse 82   Temp 98.6  F (37 C) (Oral)   Resp 16   SpO2 97%   Physical Exam Vitals and nursing note reviewed.  Constitutional:      General: She is not in acute distress.    Appearance: She is well-developed. She is not ill-appearing or toxic-appearing.  HENT:     Head: Normocephalic and atraumatic.     Nose: Nose normal.     Mouth/Throat:     Mouth: Mucous membranes are moist.  Eyes:     Pupils: Pupils are equal, round, and reactive to light.  Cardiovascular:     Rate and Rhythm: Normal rate.     Pulses: Normal pulses.     Heart sounds: Normal heart sounds.  Pulmonary:     Effort: Pulmonary effort is normal. No respiratory distress.     Breath sounds: Normal breath sounds.  Abdominal:     General: Bowel sounds are normal. There is no distension.  Musculoskeletal:        General: Normal range of motion.     Cervical back: Normal range of motion.  Skin:    General: Skin is warm and dry.     Capillary Refill: Capillary refill takes less than 2 seconds.     Comments: 1 cm area of raised erythema. Soft, mobile. Tender to palpation. No fluctuance or induration.  Neurological:     General: No focal deficit present.     Mental Status: She is alert.    ED Results / Procedures / Treatments   Labs (all labs ordered are listed, but only abnormal results are displayed) Labs Reviewed - No data to display  EKG None  Radiology No results found.  Procedures Procedures (including critical care time)  Medications Ordered in ED Medications - No data to display  ED Course  I have reviewed the triage vital signs and the nursing notes.  Pertinent labs & imaging results that were available during my care of the patient were reviewed by me and considered in my medical decision making (see chart for details).  29 year old female presents for evaluation of mass to left axilla.  She is afebrile, nonseptic, non-ill-appearing.  Patient 1 cm area of raised erythema.  No vesicles, bulla, blistering, target  lesions.  No fluctuance or induration.  Area not consistent with abscess.  No drainage or bleeding.  Mildly tender to palpation.  No systemic symptoms.  Area actually appears to be similar to  a large infected skin tag however patient states she did not have lesion here prior to 1 week ago.  At this time will start on antibiotics and refer her to dermatology for further evaluation.  No lymphadenopathy.   No blisters, no pustules, no warmth, no draining sinus tracts, no superficial abscesses, no bullous impetigo, no vesicles, no desquamation, no target lesions with dusky purpura or a central bulla. No concern for SJS, TEN, TSS, tick borne illness, syphilis or other life-threatening condition.   The patient has been appropriately medically screened and/or stabilized in the ED. I have low suspicion for any other emergent medical condition which would require further screening, evaluation or treatment in the ED or require inpatient management.  Patient is hemodynamically stable and in no acute distress.  Patient able to ambulate in department prior to ED.  Evaluation does not show acute pathology that would require ongoing or additional emergent interventions while in the emergency department or further inpatient treatment.  I have discussed the diagnosis with the patient and answered all questions.  Pain is been managed while in the emergency department and patient has no further complaints prior to discharge.  Patient is comfortable with plan discussed in room and is stable for discharge at this time.  I have discussed strict return precautions for returning to the emergency department.  Patient was encouraged to follow-up with PCP/specialist refer to at discharge.    MDM Rules/Calculators/A&P                       Final Clinical Impression(s) / ED Diagnoses Final diagnoses:  Mass of left axilla    Rx / DC Orders ED Discharge Orders         Ordered    cephALEXin (KEFLEX) 500 MG capsule  2 times  daily     10/08/19 0135    mupirocin cream (BACTROBAN) 2 %  2 times daily     10/08/19 0135           Henderly, Britni A, PA-C 10/08/19 0136    Merryl Hacker, MD 10/08/19 [phone]

## 2019-10-08 NOTE — Discharge Instructions (Signed)
Take the antibiotics as prescribed.  Apply warm compress to area.  Follow-up with dermatology or primary care provider.

## 2019-10-09 ENCOUNTER — Encounter: Payer: 59 | Admitting: Physical Therapy

## 2019-10-14 ENCOUNTER — Other Ambulatory Visit: Payer: Self-pay

## 2019-10-14 ENCOUNTER — Encounter: Payer: Self-pay | Admitting: Physical Therapy

## 2019-10-14 ENCOUNTER — Ambulatory Visit: Payer: 59 | Attending: Family Medicine | Admitting: Physical Therapy

## 2019-10-14 DIAGNOSIS — G8929 Other chronic pain: Secondary | ICD-10-CM | POA: Diagnosis present

## 2019-10-14 DIAGNOSIS — M25511 Pain in right shoulder: Secondary | ICD-10-CM | POA: Insufficient documentation

## 2019-10-14 DIAGNOSIS — M25512 Pain in left shoulder: Secondary | ICD-10-CM | POA: Insufficient documentation

## 2019-10-14 NOTE — Therapy (Signed)
[place], [place], [address] Phone: [phone]   Fax:  [phone]  Physical Therapy Treatment  Patient Details  Name: Cheryl Walters MRN: [id number] Date of Birth: 04-09-1991 Referring Provider (PT): Gregor Hams, MD    Encounter Date: 10/14/2019  PT End of Session - 10/14/19 0930    Visit Number  4    Number of Visits  13    Date for PT Re-Evaluation  11/04/19    PT Start Time  0930    PT Stop Time  6060    PT Time Calculation (min)  45 min    Activity Tolerance  Patient tolerated treatment well    Behavior During Therapy  [facility] for tasks assessed/performed       Past Medical History:  Diagnosis Date   Acne    Active preterm labor 10/23/2016   Headache(784.0)    Irregular periods    Pilonidal cyst     Past Surgical History:  Procedure Laterality Date   BIRTH CONTROL IMPLANT     CESAREAN SECTION      There were no vitals filed for this visit.  Subjective Assessment - 10/14/19 0930    Subjective  "I didn't feel much with the patch but I did noticed it helped alot"    Currently in Pain?  No/denies    Pain Score  --   1/10 with reaching overhead   Pain Onset  More than a month ago    Pain Frequency  Intermittent         [facility] PT Assessment - 10/14/19 0001      Assessment   Medical Diagnosis  Acute pain of both shoulders M25.511, M25.512    Referring Provider (PT)  Gregor Hams, MD                     [facility] Adult PT Treatment/Exercise - 10/14/19 0001      Therapeutic Activites    Therapeutic Activities  Other Therapeutic Activities    Other Therapeutic Activities  mimicking bowling with 8# weighted ball 1 x 10 at 20 ft      Shoulder Exercises: Supine   Protraction  Strengthening;Both;20 reps;Weights   with sustained  protraction with CW/CCW circles 2 x 15   Protraction Weight (lbs)  3      Shoulder Exercises: Standing   Horizontal ABduction  20  reps;Theraband   bil   Theraband Level (Shoulder Horizontal ABduction)  Level 4 (Blue)    External Rotation  15 reps;Theraband;Right    Theraband Level (Shoulder External Rotation)  Level 4 (Blue)    Internal Rotation  20 reps;Theraband    Theraband Level (Shoulder Internal Rotation)  Level 4 (Blue)    Flexion  Strengthening;Right;15 reps;Weights    Shoulder Flexion Weight (lbs)  1    Row  20 reps;Theraband    Theraband Level (Shoulder Row)  Level 4 (Blue)    Other Standing Exercises  lower trap wall y's 1 x 20     Other Standing Exercises  reaching into cabinet 1 x 10 lower shelf, 1 x 10 middle and 1 x 10 upper shelf with 3#   walking 4 x 110 ft carrying 15# kettlebellwith RUE      Shoulder Exercises: ROM/Strengthening   Nustep  L6 x 6 min UE/LE      Shoulder Exercises: Stretch   Other Shoulder Stretches  upper trap/ levator scapuale 1 x 30 sec ea.  Iontophoresis   Type of Iontophoresis  Dexamethasone    Location  R sub-acromial space    Dose  58m/ml    Time  6 hour patch               PT Short Term Goals - 09/23/19 1604      PT SHORT TERM GOAL #1   Title  pt to be I inital HEP    Time  3    Period  Weeks    Status  New    Target Date  10/14/19      PT SHORT TERM GOAL #2   Title  pt to verbalize / demo efficient posture and lifting mechanics to reduce and prevent shoulder pain    Time  3    Period  Weeks    Status  New    Target Date  10/14/19        PT Long Term Goals - 09/23/19 1605      PT LONG TERM GOAL #1   Title  pt to maintain shoulder AROM in all planes with no report of pain for improvement in condition    Time  6    Period  Weeks    Status  New    Target Date  11/04/19      PT LONG TERM GOAL #2   Title  pt to be able to lift and lower >/= 10# to and from overhead shelf and push/pull >/= 15# for functional strength required for ADLS and work related activities    Time  6    Period  Weeks    Status  New    Target Date  11/04/19       PT LONG TERM GOAL #3   Title  pt to be able to return to playing basketball and bowling with no report of pain or limitations for QOL    Time  6    Period  Weeks    Status  New    Target Date  11/04/19      PT LONG TERM GOAL #4   Title  pt to be I with all HEP given to maintain and progress current level of function    Time  6    Period  Weeks    Status  New    Target Date  11/04/19            Plan - 10/14/19 0958    Clinical Impression Statement  pt reports she feels she is getting better and the patch helped last session. focused session primarily on shoulder strengthening and stability which she did very well with. began practicing sport specfici bowling activity with 8# weighted ball which she responded well to. she reported no pain at rest and only 1/10 at end range flexion. continued ionto applied to the shoulder end of session.    PT Treatment/Interventions  ADLs/Self Care Home Management;Cryotherapy;Electrical Stimulation;Iontophoresis 426mml Dexamethasone;Ultrasound;Moist Heat;Therapeutic activities;Therapeutic exercise;Neuromuscular re-education;Patient/family education;Manual techniques;Dry needling;Passive range of motion;Taping    PT Next Visit Plan  review/ update HEP PRN, lifting and carrying activities, scapular stability and GHJ strengthening, STW and stretching for upper trap/leator scapuale, response to loading last session?, continue with ionto if helpufl    PT Home Exercise Plan  [id number] - upper trap/ levator scapulae stretching, shoulder IR/ER, rows, and money,    Consulted and Agree with Plan of Care  Patient       Patient will benefit from skilled therapeutic intervention in order to  improve the following deficits and impairments:  Improper body mechanics, Increased muscle spasms, Pain, Decreased activity tolerance  Visit Diagnosis: Chronic right shoulder pain     Problem List Patient Active Problem List   Diagnosis Date Noted   Abscess 05/23/2018    IUD (intrauterine device)  09/04/2017   Blood in stool 06/25/2014   Acne vulgaris 06/25/2014   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 [place], [place], [address] Phone: [phone]   Fax:  [phone]  Name: Cheryl Walters MRN: [id number] Date of Birth: 1990/12/16

## 2019-10-16 ENCOUNTER — Encounter: Payer: Self-pay | Admitting: Physical Therapy

## 2019-10-16 ENCOUNTER — Ambulatory Visit: Payer: 59 | Admitting: Physical Therapy

## 2019-10-16 ENCOUNTER — Other Ambulatory Visit: Payer: Self-pay

## 2019-10-16 DIAGNOSIS — M25511 Pain in right shoulder: Secondary | ICD-10-CM | POA: Diagnosis not present

## 2019-10-16 DIAGNOSIS — M25512 Pain in left shoulder: Secondary | ICD-10-CM

## 2019-10-16 NOTE — Therapy (Signed)
[place], [place], [address] Phone: [phone]   Fax:  [phone]  Physical Therapy Treatment  Patient Details  Name: Cheryl Walters MRN: [id number] Date of Birth: 1990-11-06 Referring Provider (PT): Gregor Hams, MD    Encounter Date: 10/16/2019  PT End of Session - 10/16/19 1407    Visit Number  5    Number of Visits  13    Date for PT Re-Evaluation  11/04/19    PT Start Time  0930    PT Stop Time  1010    PT Time Calculation (min)  40 min    Activity Tolerance  Patient tolerated treatment well    Behavior During Therapy  [facility] for tasks assessed/performed       Past Medical History:  Diagnosis Date  . Acne   . Active preterm labor 10/23/2016  . Headache(784.0)   . Irregular periods   . Pilonidal cyst     Past Surgical History:  Procedure Laterality Date  . BIRTH CONTROL IMPLANT    . CESAREAN SECTION      There were no vitals filed for this visit.  Subjective Assessment - 10/16/19 0938    Subjective  Patient reports she bowled on Wednesday. She was a little sore but overall did well. She reports fatigue after the last visit but no significant pain.    Limitations  Lifting    How long can you sit comfortably?  unlimited    How long can you stand comfortably?  unlimited    How long can you walk comfortably?  unlimited    Currently in Pain?  No/denies                        [facility] Adult PT Treatment/Exercise - 10/16/19 0001      Shoulder Exercises: Supine   Protraction  Strengthening;Both;20 reps;Weights   with sustained  protraction with CW/CCW circles 2 x 15   Protraction Weight (lbs)  3      Shoulder Exercises: Seated   Flexion  Strengthening;Right;15 reps;Weights    Flexion Weight (lbs)  3      Shoulder Exercises: Sidelying   ABduction  Strengthening;Right;20 reps;Weights    ABduction Weight (lbs)  3      Shoulder Exercises: Standing   Horizontal  ABduction  20 reps;Theraband   bil   Theraband Level (Shoulder Horizontal ABduction)  Level 4 (Blue)    External Rotation  15 reps;Theraband;Right    Theraband Level (Shoulder External Rotation)  Level 4 (Blue)    Internal Rotation  20 reps;Theraband    Theraband Level (Shoulder Internal Rotation)  Level 4 (Blue)    Flexion  Strengthening;Right;15 reps;Weights    Shoulder Flexion Weight (lbs)  1    Row  20 reps;Theraband    Theraband Level (Shoulder Row)  Level 4 (Blue)    Other Standing Exercises  lower trap wall y's 1 x 20     Other Standing Exercises  reaching into cabinet 1 x 10 lower shelf, 1 x 10 middle and 1 x 10 upper shelf with 3#   walking 4 x 110 ft carrying 15# kettlebellwith RUE      Shoulder Exercises: ROM/Strengthening   Nustep  L6 x 6 min UE/LE      Iontophoresis   Type of Iontophoresis  Dexamethasone    Location  R sub-acromial space    Dose  52m/ml    Time  6 hour patch  PT Education - 10/16/19 1406    Education Details  HEPO and symtpom management    Person(s) Educated  Patient    Methods  Explanation;Demonstration;Tactile cues;Verbal cues    Comprehension  Verbalized understanding;Returned demonstration;Verbal cues required;Tactile cues required       PT Short Term Goals - 09/23/19 1604      PT SHORT TERM GOAL #1   Title  pt to be I inital HEP    Time  3    Period  Weeks    Status  New    Target Date  10/14/19      PT SHORT TERM GOAL #2   Title  pt to verbalize / demo efficient posture and lifting mechanics to reduce and prevent shoulder pain    Time  3    Period  Weeks    Status  New    Target Date  10/14/19        PT Long Term Goals - 09/23/19 1605      PT LONG TERM GOAL #1   Title  pt to maintain shoulder AROM in all planes with no report of pain for improvement in condition    Time  6    Period  Weeks    Status  New    Target Date  11/04/19      PT LONG TERM GOAL #2   Title  pt to be able to lift and lower >/= 10#  to and from overhead shelf and push/pull >/= 15# for functional strength required for ADLS and work related activities    Time  6    Period  Weeks    Status  New    Target Date  11/04/19      PT LONG TERM GOAL #3   Title  pt to be able to return to playing basketball and bowling with no report of pain or limitations for QOL    Time  6    Period  Weeks    Status  New    Target Date  11/04/19      PT LONG TERM GOAL #4   Title  pt to be I with all HEP given to maintain and progress current level of function    Time  6    Period  Weeks    Status  New    Target Date  11/04/19            Plan - 10/16/19 1408    Clinical Impression Statement  Good tolerance to ther-ex. No increased pain with treamtnet. Patient had success with ionto. We will continue to progress as tolerated    Stability/Clinical Decision Making  Stable/Uncomplicated    PT Treatment/Interventions  ADLs/Self Care Home Management;Cryotherapy;Electrical Stimulation;Iontophoresis 22m/ml Dexamethasone;Ultrasound;Moist Heat;Therapeutic activities;Therapeutic exercise;Neuromuscular re-education;Patient/family education;Manual techniques;Dry needling;Passive range of motion;Taping       Patient will benefit from skilled therapeutic intervention in order to improve the following deficits and impairments:  Improper body mechanics, Increased muscle spasms, Pain, Decreased activity tolerance  Visit Diagnosis: Chronic right shoulder pain  Chronic left shoulder pain     Problem List Patient Active Problem List   Diagnosis Date Noted  . Abscess 05/23/2018  . IUD (intrauterine device)  09/04/2017  . Blood in stool 06/25/2014  . Acne vulgaris 06/25/2014  . 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 Muscle spasm of back 03/18/2013  . Chronic rhinitis 08/13/2011  . Recurrent headache 08/13/2011  . 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 CHEST PAIN 12/09/2008  .  IRREGULAR MENSTRUAL CYCLE 07/31/2007  . ACNE VULGARIS  07/31/2007    Carney Living  PT DPT  10/16/2019, 2:09 PM  [facility] [address] [place], [place], [address] Phone: [phone]   Fax:  [phone]  Name: Cheryl Walters MRN: [id number] Date of Birth: 01-08-1991

## 2019-10-19 ENCOUNTER — Other Ambulatory Visit: Payer: Self-pay

## 2019-10-19 ENCOUNTER — Ambulatory Visit: Payer: 59 | Admitting: Physical Therapy

## 2019-10-19 DIAGNOSIS — M25511 Pain in right shoulder: Secondary | ICD-10-CM | POA: Diagnosis not present

## 2019-10-19 NOTE — Therapy (Signed)
[place], [place], [address] Phone: [phone]   Fax:  [phone]  Physical Therapy Treatment  Patient Details  Name: Cheryl Walters MRN: [id number] Date of Birth: [birth year] Referring Provider (PT): Gregor Hams, MD    Encounter Date: 10/19/2019  PT End of Session - 10/19/19 0845    Visit Number  6    Number of Visits  13    Date for PT Re-Evaluation  11/04/19    PT Start Time  0845    PT Stop Time  0927    PT Time Calculation (min)  42 min    Activity Tolerance  Patient tolerated treatment well    Behavior During Therapy  [facility] for tasks assessed/performed       Past Medical History:  Diagnosis Date  . Acne   . Active preterm labor 10/23/2016  . Headache(784.0)   . Irregular periods   . Pilonidal cyst     Past Surgical History:  Procedure Laterality Date  . BIRTH CONTROL IMPLANT    . CESAREAN SECTION      There were no vitals filed for this visit.  Subjective Assessment - 10/19/19 0846    Subjective  "I felt more sore after on sunday any shoulder ached all day"    Currently in Pain?  Yes    Pain Score  0-No pain   reports 8/10 with reaching over head today   Pain Orientation  Right    Pain Descriptors / Indicators  Aching;Sharp    Pain Type  Chronic pain    Pain Onset  More than a month ago    Pain Frequency  Intermittent    Aggravating Factors   reaching out and raising the arm over head.    Pain Relieving Factors  resting and staying out of the pat position.         [facility] PT Assessment - 10/19/19 0001      Assessment   Medical Diagnosis  Acute pain of both shoulders M25.511, M25.512    Referring Provider (PT)  Gregor Hams, MD                     [facility] Adult PT Treatment/Exercise - 10/19/19 0001      Elbow Exercises   Elbow Flexion  Right;Standing   2 x 15 5# curl with eccentric loading     Shoulder Exercises: Supine   Flexion  AROM;Strengthening    Other Supine Exercises  shoulder flexion 2 x 12 pushing out on pilates ring       Shoulder Exercises: Sidelying   ABduction  12 reps;Right      Shoulder Exercises: Standing   Protraction  Strengthening;12 reps   pushing roam roll into wall roll into the wall   Horizontal ABduction  20 reps;Theraband    Theraband Level (Shoulder Horizontal ABduction)  Level 4 (Blue)      Shoulder Exercises: ROM/Strengthening   Nustep  L3 x 4 min    changing direction at 2 min      Iontophoresis   Type of Iontophoresis  Dexamethasone    Location  R sub-acromial space    Dose  68m/ml    Time  6 hour patch      Manual Therapy   Manual therapy comments  MTPR along the biceps brachii  x 2             PT Education - 10/19/19 02263  Education Details  discussed with pt regarding getting set back up with her MD for potentiall further imaging, if she doesn't continue to progress over the next few visits.    Person(s) Educated  Patient    Methods  Explanation;Verbal cues    Comprehension  Verbalized understanding;Verbal cues required       PT Short Term Goals - 09/23/19 1604      PT SHORT TERM GOAL #1   Title  pt to be I inital HEP    Time  3    Period  Weeks    Status  New    Target Date  10/14/19      PT SHORT TERM GOAL #2   Title  pt to verbalize / demo efficient posture and lifting mechanics to reduce and prevent shoulder pain    Time  3    Period  Weeks    Status  New    Target Date  10/14/19        PT Long Term Goals - 09/23/19 1605      PT LONG TERM GOAL #1   Title  pt to maintain shoulder AROM in all planes with no report of pain for improvement in condition    Time  6    Period  Weeks    Status  New    Target Date  11/04/19      PT LONG TERM GOAL #2   Title  pt to be able to lift and lower >/= 10# to and from overhead shelf and push/pull >/= 15# for functional strength required for ADLS and work related activities    Time  6    Period  Weeks    Status  New     Target Date  11/04/19      PT LONG TERM GOAL #3   Title  pt to be able to return to playing basketball and bowling with no report of pain or limitations for QOL    Time  6    Period  Weeks    Status  New    Target Date  11/04/19      PT LONG TERM GOAL #4   Title  pt to be I with all HEP given to maintain and progress current level of function    Time  6    Period  Weeks    Status  New    Target Date  11/04/19            Plan - 10/19/19 0911    Clinical Impression Statement  pt arrived to today's session noting increased pain starting yesterday rated at 8/10. with increased pain today she demonstrates increased pain along the long head of the bicep brachii into the shoulder. continued shoulder AROM reaching overhead in sidelying, and shoulder strengthening monitoring for pain. cotinued one more application of the ionto to calm down inflammation.    PT Treatment/Interventions  ADLs/Self Care Home Management;Cryotherapy;Electrical Stimulation;Iontophoresis 25m/ml Dexamethasone;Ultrasound;Moist Heat;Therapeutic activities;Therapeutic exercise;Neuromuscular re-education;Patient/family education;Manual techniques;Dry needling;Passive range of motion;Taping    PT Next Visit Plan  review/ update HEP PRN, lifting and carrying activities, scapular stability and GHJ strengthening, STW and stretching for bicep brachii,  response to loading last session? how was bowling?    PT Home Exercise Plan  [id number] - upper trap/ levator scapulae stretching, shoulder IR/ER, rows, and money,    Consulted and Agree with Plan of Care  Patient       Patient will benefit from skilled therapeutic intervention  in order to improve the following deficits and impairments:  Improper body mechanics, Increased muscle spasms, Pain, Decreased activity tolerance  Visit Diagnosis: Chronic right shoulder pain     Problem List Patient Active Problem List   Diagnosis Date Noted  . Abscess 05/23/2018  . IUD  (intrauterine device)  09/04/2017  . Blood in stool 06/25/2014  . Acne vulgaris 06/25/2014  . 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 Muscle spasm of back 03/18/2013  . Chronic rhinitis 08/13/2011  . Recurrent headache 08/13/2011  . 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 CHEST PAIN 12/09/2008  . IRREGULAR MENSTRUAL CYCLE 07/31/2007  . ACNE VULGARIS 07/31/2007   Starr Lake PT, DPT, LAT, ATC  10/19/19  9:31 AM      [facility] [address] [place], [place], [address] Phone: [phone]   Fax:  [phone]  Name: FATOUMATA ALBAUGH MRN: [id number] Date of Birth: [birth year]

## 2019-10-21 ENCOUNTER — Other Ambulatory Visit: Payer: Self-pay

## 2019-10-21 ENCOUNTER — Encounter: Payer: Self-pay | Admitting: Family Medicine

## 2019-10-21 ENCOUNTER — Ambulatory Visit: Payer: 59 | Admitting: Physical Therapy

## 2019-10-21 ENCOUNTER — Encounter: Payer: Self-pay | Admitting: Physical Therapy

## 2019-10-21 ENCOUNTER — Ambulatory Visit (INDEPENDENT_AMBULATORY_CARE_PROVIDER_SITE_OTHER): Payer: 59

## 2019-10-21 ENCOUNTER — Ambulatory Visit (INDEPENDENT_AMBULATORY_CARE_PROVIDER_SITE_OTHER): Payer: 59 | Admitting: Family Medicine

## 2019-10-21 VITALS — BP 124/80 | HR 69 | Ht 60.0 in | Wt 180.0 lb

## 2019-10-21 DIAGNOSIS — M7551 Bursitis of right shoulder: Secondary | ICD-10-CM

## 2019-10-21 DIAGNOSIS — M25511 Pain in right shoulder: Secondary | ICD-10-CM

## 2019-10-21 NOTE — Patient Instructions (Signed)
Thank you for coming in today. Get xray today on your way out.  Plan for MRI.  Recheck after MRI.

## 2019-10-21 NOTE — Therapy (Addendum)
[place], [place], [address] Phone: [phone]   Fax:  [phone]  Physical Therapy Treatment / Discharge  Patient Details  Name: Cheryl Walters MRN: [id number] Date of Birth: October 16, 1990 Referring Provider (PT): Gregor Hams, MD    Encounter Date: 10/21/2019  PT End of Session - 10/21/19 1017    Visit Number  7    Number of Visits  13    Date for PT Re-Evaluation  11/04/19    PT Start Time  0932    PT Stop Time  1015    PT Time Calculation (min)  43 min    Activity Tolerance  Patient tolerated treatment well    Behavior During Therapy  [facility] for tasks assessed/performed       Past Medical History:  Diagnosis Date  . Acne   . Active preterm labor 10/23/2016  . Headache(784.0)   . Irregular periods   . Pilonidal cyst     Past Surgical History:  Procedure Laterality Date  . BIRTH CONTROL IMPLANT    . CESAREAN SECTION      There were no vitals filed for this visit.  Subjective Assessment - 10/21/19 0934    Subjective  " I feel like it is better but not back to where it was before the pain started last session"    Currently in Pain?  Yes    Pain Score  3     Pain Location  Shoulder    Pain Orientation  Right    Pain Descriptors / Indicators  Aching;Sore    Pain Type  Chronic pain         [facility] PT Assessment - 10/21/19 0001      Assessment   Medical Diagnosis  Acute pain of both shoulders M25.511, M25.512    Referring Provider (PT)  Gregor Hams, MD                     [facility] Adult PT Treatment/Exercise - 10/21/19 0001      Elbow Exercises   Elbow Flexion  Supine;10 reps;Right;Theraband   eccentric bicep curl with green theraband     Shoulder Exercises: Prone   Other Prone Exercises  quadruped bil serratus press 2 x 15   cues for proper form     Shoulder Exercises: Standing   External Rotation  Strengthening;20 reps;Theraband    Theraband Level (Shoulder  External Rotation)  Level 4 (Blue)    Internal Rotation  Strengthening;20 reps;Theraband    Theraband Level (Shoulder Internal Rotation)  Level 4 (Blue)    Flexion  20 reps;Strengthening;Right;Theraband    Theraband Level (Shoulder Flexion)  Level 2 (Red)    Flexion Limitations  scaption angle    Other Standing Exercises  lower trap wall y's 1 x 20    pulling yellow band   Other Standing Exercises  scapular retraction with ER 1 x 20 with blue theraband      Shoulder Exercises: ROM/Strengthening   UBE (Upper Arm Bike)  L2 x 6 min    FWD/BWD x 3 min      Manual Therapy   Manual Therapy  Soft tissue mobilization;Taping    Manual therapy comments  MTPR along the biceps brachii  x 2    Soft tissue mobilization  along the biceps brachii with XFM along the long head bicpes tendon    Kinesiotex  Inhibit Muscle      Kinesiotix   Inhibit Muscle  R bicep               PT Short Term Goals - 09/23/19 1604      PT SHORT TERM GOAL #1   Title  pt to be I inital HEP    Time  3    Period  Weeks    Status  New    Target Date  10/14/19      PT SHORT TERM GOAL #2   Title  pt to verbalize / demo efficient posture and lifting mechanics to reduce and prevent shoulder pain    Time  3    Period  Weeks    Status  New    Target Date  10/14/19        PT Long Term Goals - 09/23/19 1605      PT LONG TERM GOAL #1   Title  pt to maintain shoulder AROM in all planes with no report of pain for improvement in condition    Time  6    Period  Weeks    Status  New    Target Date  11/04/19      PT LONG TERM GOAL #2   Title  pt to be able to lift and lower >/= 10# to and from overhead shelf and push/pull >/= 15# for functional strength required for ADLS and work related activities    Time  6    Period  Weeks    Status  New    Target Date  11/04/19      PT LONG TERM GOAL #3   Title  pt to be able to return to playing basketball and bowling with no report of pain or limitations for QOL     Time  6    Period  Weeks    Status  New    Target Date  11/04/19      PT LONG TERM GOAL #4   Title  pt to be I with all HEP given to maintain and progress current level of function    Time  6    Period  Weeks    Status  New    Target Date  11/04/19            Plan - 10/21/19 1012    Clinical Impression Statement  pt notes contineus pain at 3/10 which is an improvement compared to previous session. She responded well to biep trigger point release and IASTM techniques. held off on ionto today due to pt having 4 doses over the last 4 visits, trialed inhibition KT taping today along the bicep brachii. continued shoulder strengthening which she did well with today.    PT Next Visit Plan  review/ update HEP PRN, lifting and carrying activities, scapular stability and GHJ strengthening, STW and stretching for bicep brachii,  response to loading last session? how was bowling? how was taping?    PT Home Exercise Plan  [id number] - upper trap/ levator scapulae stretching, shoulder IR/ER, rows, and money,    Consulted and Agree with Plan of Care  Patient       Patient will benefit from skilled therapeutic intervention in order to improve the following deficits and impairments:     Visit Diagnosis: Chronic right shoulder pain     Problem List Patient Active Problem List   Diagnosis Date Noted  . Abscess 05/23/2018  . IUD (intrauterine device)  09/04/2017  . Blood in stool 06/25/2014  . Acne vulgaris 06/25/2014  . Sleep  pattern disturbance 06/11/2014  . Muscle spasm of back 03/18/2013  . Chronic rhinitis 08/13/2011  . Recurrent headache 08/13/2011  . 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 CHEST PAIN 12/09/2008  . IRREGULAR MENSTRUAL CYCLE 07/31/2007  . ACNE VULGARIS 07/31/2007   Starr Lake PT, DPT, LAT, ATC  10/21/19  10:18 AM      [facility] [address] [place], [place], [address] Phone: [phone]   Fax:   [phone]  Name: Cheryl Walters MRN: [id number] Date of Birth: July 09, 1990      PHYSICAL THERAPY DISCHARGE SUMMARY  Visits from Start of Care: 7  Current functional level related to goals / functional outcomes: See goals   Remaining deficits: Pt called to cancel and discharge from PT. Continued pain with reaching overhead and lifting/ carrying activities.    Education / Equipment: HEP, theraband, posture, lifting mechanics.   Plan: Patient agrees to discharge.  Patient goals were partially met. Patient is being discharged due to the patient's request.  ?????         Starr Lake PT, DPT, LAT, ATC  10/26/19  2:59 PM

## 2019-10-21 NOTE — Progress Notes (Signed)
Cheryl Walters, am serving as a Education administrator for Dr. [name].  Cheryl Walters is a 29 y.o. female who presents to [place] at [facility] today for bilateral shoulder pain. Patient was last seen by Dr. Georgina Snell on 09/16/2019 where patient stated Pain is over anterior shoulder pain with flexion and horizontal abduction. Denies any radiating symptoms. No history of shoulder issues. Has not tried any therapies for her shoulder. Pain is achy and stiff in the morning.  No pain radiating below the level of the upper arm.  No weakness or numbness distally. No clicking or catching.  Right is worse than left.  She has not tried much treatment yet. Since last visit patient reports R shoulder is the only one hurting. States was getting better but now it is worse again but not as bad as it was when it first started.    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   MSK: Right shoulder normal-appearing Normal motion pain with abduction. Intact strength abduction external/internal rotation of her pain with resisted abduction. Positive empty can test. Negative Hawkins and Neer's test. Negative Yergason's and speeds test.    Lab and Radiology Results X-ray images right shoulder obtained today personally and independently reviewed Normal-appearing with no significant degenerative change or fractures. Await radiology review     Assessment and Plan: 29 y.o. female with right shoulder pain.  Left shoulder pain did improve with physical therapy and right shoulder pain changed a bit but is still obnoxious and painful and interfering with quality of life.  After  discussion with patient plan for MRI and recheck after MRI.  X-ray obtained today.   PDMP not reviewed this encounter. Orders Placed This Encounter  Procedures  . DG Shoulder Right    Standing Status:   Future    Standing Expiration Date:   10/20/2020    Order Specific Question:   Reason for Exam (SYMPTOM  OR DIAGNOSIS REQUIRED)    Answer:   eval shoulder pain r    Order Specific Question:   Is patient pregnant?    Answer:   No    Order Specific Question:   Preferred imaging location?    Answer:   [facility]    Order Specific Question:   Radiology Contrast Protocol - do NOT remove file path    Answer:   \\[email or web address]  . MR SHOULDER RIGHT WO CONTRAST    Standing Status:   Future    Standing Expiration Date:   10/20/2020    Order Specific Question:   What is the patient's sedation requirement?    Answer:   No Sedation    Order Specific Question:   Does the patient have a pacemaker or implanted devices?    Answer:   No    Order Specific Question:   Preferred imaging location?    Answer:   Product/process development scientist (table limit-350lbs)    Order Specific Question:   Radiology Contrast Protocol - do NOT remove file path    Answer:   \\[email or web address]\Radiant\[email or web address].PDF   No orders of the defined types were placed in this encounter.  Discussed warning signs or symptoms. Please see discharge instructions. Patient expresses understanding.   The above documentation has been reviewed and is accurate and complete [name], M.D.

## 2019-10-22 ENCOUNTER — Encounter: Payer: Self-pay | Admitting: Family Medicine

## 2019-10-23 NOTE — Progress Notes (Signed)
X-ray right shoulder looks pretty normal

## 2019-10-25 ENCOUNTER — Other Ambulatory Visit: Payer: Self-pay

## 2019-10-25 ENCOUNTER — Ambulatory Visit (INDEPENDENT_AMBULATORY_CARE_PROVIDER_SITE_OTHER): Payer: 59

## 2019-10-25 DIAGNOSIS — M7551 Bursitis of right shoulder: Secondary | ICD-10-CM

## 2019-10-25 DIAGNOSIS — M25511 Pain in right shoulder: Secondary | ICD-10-CM

## 2019-10-26 ENCOUNTER — Ambulatory Visit: Payer: 59 | Admitting: Physical Therapy

## 2019-10-26 NOTE — Progress Notes (Signed)
MRI right shoulder shows some mild tendinitis but no full-thickness rotator cuff tear.  Please schedule follow-up appoint with me in the near future to review MRI findings in full detail to discuss next steps.

## 2019-10-28 ENCOUNTER — Ambulatory Visit: Payer: Self-pay

## 2019-10-28 ENCOUNTER — Ambulatory Visit (INDEPENDENT_AMBULATORY_CARE_PROVIDER_SITE_OTHER): Payer: 59 | Admitting: Family Medicine

## 2019-10-28 ENCOUNTER — Encounter: Payer: Self-pay | Admitting: Family Medicine

## 2019-10-28 ENCOUNTER — Other Ambulatory Visit: Payer: Self-pay

## 2019-10-28 ENCOUNTER — Ambulatory Visit: Payer: 59 | Admitting: Physical Therapy

## 2019-10-28 VITALS — BP 112/84 | HR 70 | Ht 60.0 in | Wt 179.4 lb

## 2019-10-28 DIAGNOSIS — M25511 Pain in right shoulder: Secondary | ICD-10-CM | POA: Diagnosis not present

## 2019-10-28 NOTE — Progress Notes (Signed)
   I, Cheryl Walters, LAT, ATC, am serving as scribe for Dr. [name].  Cheryl Walters is a 29 y.o. female who presents to [place] at [facility] today for f/u of R shoulder pain and R shoulder MRI review.  She was last seen by Dr. Georgina Snell on 10/21/19 and was then referred for a R shoulder MRI that she had on 10/25/19.  She reported worsening pain w/ shoulder flexion and horizontal aDd.  Since her last visit, pt reports that her R shoulder feels about the same. She has been able to resume bowling.  She notes her pain is only minimal and intermittent.  She does not think it is bad enough to need an injection or surgery at this point.  Diagnostic testing: R shoulder XR- 10/21/19; R shoulder MRI- 10/25/19   Pertinent review of systems: No fevers or chills  Relevant historical information: Headache   Exam:  BP 112/84 (BP Location: Right Arm, Patient Position: Sitting, Cuff Size: Normal)   Pulse 70   Ht 5' (1.524 m)   Wt 179 lb 6.4 oz (81.4 kg)   SpO2 98%   BMI 35.04 kg/m  General: Well Developed, well nourished, and in no acute distress.   MSK: Right shoulder normal-appearing nontender normal motion normal strength negative impingement testing negative biceps tendinitis testing.    Lab and Radiology Results No results found for this or any previous visit (from the past 72 hour(s)). MR SHOULDER RIGHT WO CONTRAST  Result Date: 10/26/2019 CLINICAL DATA:  Right shoulder pain, reduced range of motion when raising the arm over the last 2 months EXAM: MRI OF THE RIGHT SHOULDER WITHOUT CONTRAST TECHNIQUE: Multiplanar, multisequence MR imaging of the shoulder was performed. No intravenous contrast was administered. COMPARISON:  Radiographs from 10/21/2019 FINDINGS: Rotator cuff:  Mild infraspinatus tendinopathy on image 7/7. Muscles:  Unremarkable Biceps long head:  Unremarkable Acromioclavicular Joint: Mild degenerative spurring and mild degenerative subcortical marrow edema.  Type II acromion. No significant bursitis. Glenohumeral Joint: Unremarkable Labrum:  Grossly unremarkable. Bones: Small degenerative subcortical cystic lesion posteriorly along the humeral head. Other: No supplemental non-categorized findings. IMPRESSION: 1. Mild infraspinatus tendinopathy. No rotator cuff tear identified. 2. Mild degenerative spurring and mild degenerative subcortical marrow edema along the acromioclavicular joint. Electronically Signed   By: Van Clines M.D.   On: 10/26/2019 08:38   I, [name], personally (independently) visualized and performed the interpretation of the images attached in this note. .     Assessment and Plan: 29 y.o. female with right shoulder pain due to [facility] degeneration and infraspinatus tendinopathy.  After discussion with patient her symptoms are pretty minimal and intermittent.  She is very reluctant to consider injection or surgery at this point.  Plan for resumption of activity as tolerated and recheck back if not improving.  Next step would probably be injection subacromial or AC joint.    Discussed warning signs or symptoms. Please see discharge instructions. Patient expresses understanding.   The above documentation has been reviewed and is accurate and complete [name], M.D. Total encounter time 20 minutes including charting time date of service. MRI findings and treatment plan

## 2019-10-28 NOTE — Patient Instructions (Signed)
Thank you for coming in today. We can keep an eye on this.  If bad enough next step is an injection.  OK to advance bowling.  Ok to take up to 2 aleve or 3-4 ibuprofen before bowling or pain.   Recheck with me as needed.

## 2019-11-04 ENCOUNTER — Encounter: Payer: 59 | Admitting: Physical Therapy

## 2019-11-06 ENCOUNTER — Ambulatory Visit: Payer: 59 | Admitting: Physical Therapy

## 2019-11-28 ENCOUNTER — Emergency Department (HOSPITAL_COMMUNITY): Payer: 59

## 2019-11-28 ENCOUNTER — Other Ambulatory Visit: Payer: Self-pay

## 2019-11-28 ENCOUNTER — Emergency Department (HOSPITAL_COMMUNITY)
Admission: EM | Admit: 2019-11-28 | Discharge: 2019-11-28 | Disposition: A | Discharge status: Home / Self Care | Payer: 59 | Attending: Emergency Medicine | Admitting: Emergency Medicine

## 2019-11-28 DIAGNOSIS — R1013 Epigastric pain: Secondary | ICD-10-CM | POA: Diagnosis not present

## 2019-11-28 DIAGNOSIS — R0789 Other chest pain: Secondary | ICD-10-CM | POA: Diagnosis not present

## 2019-11-28 LAB — BASIC METABOLIC PANEL
Anion gap: 9 (ref 5–15)
BUN: 6 mg/dL (ref 6–20)
CO2: 27 mmol/L (ref 22–32)
Calcium: 9.1 mg/dL (ref 8.9–10.3)
Chloride: 104 mmol/L (ref 98–111)
Creatinine, Ser: 0.79 mg/dL (ref 0.44–1.00)
GFR calc Af Amer: >60 mL/min (ref >60)
GFR calc non Af Amer: >60 mL/min (ref >60)
Glucose, Bld: 107 mg/dL — ABNORMAL HIGH (ref 70–99)
Potassium: 4.2 mmol/L (ref 3.5–5.1)
Sodium: 140 mmol/L (ref 135–145)

## 2019-11-28 LAB — CBC
HCT: 40.9 % (ref 36.0–46.0)
Hemoglobin: 12.9 g/dL (ref 12.0–15.0)
MCH: 26.2 pg (ref 26.0–34.0)
MCHC: 31.5 g/dL (ref 30.0–36.0)
MCV: 83.1 fL (ref 80.0–100.0)
Platelets: 360 10*3/uL (ref 150–400)
RBC: 4.92 MIL/uL (ref 3.87–5.11)
RDW: 12.7 % (ref 11.5–15.5)
WBC: 11 10*3/uL — ABNORMAL HIGH (ref 4.0–10.5)
nRBC: 0 % (ref 0.0–0.2)

## 2019-11-28 LAB — TROPONIN I (HIGH SENSITIVITY)
Troponin I (High Sensitivity): 2 ng/L (ref <18)
Troponin I (High Sensitivity): 3 ng/L (ref <18)

## 2019-11-28 MED ORDER — SUCRALFATE 1 GM/10ML PO SUSP
1.0000 g | Freq: Once | ORAL | Status: AC
  Administered 2019-11-28: 1 g via ORAL
  Filled 2019-11-28: qty 10

## 2019-11-28 MED ORDER — SUCRALFATE 1 G PO TABS: 1.0000 g | ORAL_TABLET | Freq: Three times a day (TID) | ORAL | 0 refills | Status: DC

## 2019-11-28 MED ORDER — PANTOPRAZOLE SODIUM 40 MG PO TBEC
40.0000 mg | DELAYED_RELEASE_TABLET | Freq: Every day | ORAL | Status: DC
  Administered 2019-11-28: 40 mg via ORAL
  Filled 2019-11-28: qty 1

## 2019-11-28 MED ORDER — OMEPRAZOLE 20 MG PO CPDR: 20.0000 mg | DELAYED_RELEASE_CAPSULE | Freq: Every day | ORAL | 0 refills | Status: DC

## 2019-11-28 NOTE — ED Provider Notes (Signed)
[facility] EMERGENCY DEPARTMENT Provider Note   CSN: [id number] Arrival date & time: 11/28/19  0118     History Chief Complaint  Patient presents with  . Chest Pain    Cheryl Walters is a 29 y.o. female.  HPI   Patient presents with chest pain, epigastric pain. Pain began yesterday, since onset has been intermittent, waxing, waning both in presentation and severity. Pain is in the epigastrium, left and right subcostal regions as well as the sternum.  Line pain is brief, severe, sharp at times. No associated vomiting, no diarrhea.  She does have a history of IBS, notes that this is different than her typical pain episodes. She has not taken any medication for pain relief since onset yesterday.   Past Medical History:  Diagnosis Date  . Acne   . Active preterm labor 10/23/2016  . Headache(784.0)   . Irregular periods   . Pilonidal cyst     Patient Active Problem List   Diagnosis Date Noted  . Abscess 05/23/2018  . IUD (intrauterine device)  09/04/2017  . Blood in stool 06/25/2014  . Acne vulgaris 06/25/2014  . 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 Muscle spasm of back 03/18/2013  . Chronic rhinitis 08/13/2011  . Recurrent headache 08/13/2011  . 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 CHEST PAIN 12/09/2008  . IRREGULAR MENSTRUAL CYCLE 07/31/2007  . ACNE VULGARIS 07/31/2007    Past Surgical History:  Procedure Laterality Date  . BIRTH CONTROL IMPLANT    . CESAREAN SECTION       OB History    Gravida  2   Para  2   Term  1   Preterm  1   AB      Living  2     SAB      TAB      Ectopic      Multiple  0   Live Births  1           Family History  Problem Relation Age of Onset  . Other Mother        irreg periods  . Other Sister        irreg periods  . Sleep apnea Father        on machine  . Breast cancer Maternal Aunt   . Brain cancer Paternal Aunt   . Colon cancer Neg Hx   . Throat cancer Neg Hx   .  Kidney disease Neg Hx   . Liver disease Neg Hx   . Diabetes Neg Hx   . Heart disease Neg Hx   . Stomach cancer Neg Hx     Social History   Tobacco Use  . Smoking status: Never Smoker  . Smokeless tobacco: Never Used  Vaping Use  . Vaping Use: Never used  Substance Use Topics  . Alcohol use: Yes    Alcohol/week: 0.0 standard drinks    Comment: rarely  . Drug use: No    Home Medications Prior to Admission medications   Medication Sig Start Date End Date Taking? Authorizing Provider  levonorgestrel (MIRENA) 20 MCG/24HR IUD 1 each by Intrauterine route once.    [provider]  mesalamine (LIALDA) 1.2 g EC tablet Take 4 tablets (4.8 g total) by mouth daily with breakfast. Patient not taking: Reported on 09/23/2019 08/05/19   Levin Erp, PA  mupirocin cream (BACTROBAN) 2 % Apply 1 application topically 2 (two) times daily. Patient not taking: Reported on 10/28/2019 10/08/19  Henderly, Britni A, PA-C  nortriptyline (PAMELOR) 10 MG capsule Take 1 capsule (10 mg total) by mouth at bedtime. Patient not taking: Reported on 09/23/2019 08/20/19   Pieter Partridge, DO  rizatriptan (MAXALT) 10 MG tablet Take 1 tablet earliest onset of headache.  May repeat in 2 hours if needed.  Maximum 2 tablets in 24 hours Patient not taking: Reported on 09/23/2019 08/20/19   Pieter Partridge, DO    Allergies    Patient has no known allergies.  Review of Systems   Review of Systems  Constitutional:       Per HPI, otherwise negative  HENT:       Per HPI, otherwise negative  Respiratory:       Per HPI, otherwise negative  Cardiovascular:       Per HPI, otherwise negative  Gastrointestinal: Negative for vomiting.  Endocrine:       Negative aside from HPI  Genitourinary:       Neg aside from HPI   Musculoskeletal:       Per HPI, otherwise negative  Skin: Negative.   Neurological: Negative for syncope.    Physical Exam Updated Vital Signs BP 103/74 (BP Location: Left Arm)   Pulse 75    Temp 98.3 F (36.8 C) (Oral)   Resp 15   Ht 5' (1.524 m)   Wt 83 kg   SpO2 99%   BMI 35.74 kg/m   Physical Exam Vitals and nursing note reviewed.  Constitutional:      General: She is not in acute distress.    Appearance: She is well-developed.  HENT:     Head: Normocephalic and atraumatic.  Eyes:     Conjunctiva/sclera: Conjunctivae normal.  Cardiovascular:     Rate and Rhythm: Normal rate and regular rhythm.  Pulmonary:     Effort: Pulmonary effort is normal. No respiratory distress.     Breath sounds: Normal breath sounds. No stridor.  Abdominal:     General: There is no distension.  Skin:    General: Skin is warm and dry.  Neurological:     Mental Status: She is alert and oriented to person, place, and time.     Cranial Nerves: No cranial nerve deficit.     ED Results / Procedures / Treatments   Labs (all labs ordered are listed, but only abnormal results are displayed) Labs Reviewed  BASIC METABOLIC PANEL - Abnormal; Notable for the following components:      Result Value   Glucose, Bld 107 (*)    All other components within normal limits  CBC - Abnormal; Notable for the following components:   WBC 11.0 (*)    All other components within normal limits  TROPONIN I (HIGH SENSITIVITY)  TROPONIN I (HIGH SENSITIVITY)    EKG EKG Interpretation  Date/Time:  Saturday November 28 2019 01:29:21 EDT Ventricular Rate:  70 PR Interval:  160 QRS Duration: 80 QT Interval:  380 QTC Calculation: 410 R Axis:   74 Text Interpretation: Normal sinus rhythm Normal ECG When compared with ECG of 08/14/2011 no significant change was found   Radiology DG Chest 2 View  Result Date: 11/28/2019 CLINICAL DATA:  29 year old female with chest pain. EXAM: CHEST - 2 VIEW COMPARISON:  Chest radiograph dated 08/15/2011. FINDINGS: The heart size and mediastinal contours are within normal limits. Both lungs are clear. The visualized skeletal structures are unremarkable. IMPRESSION: No  active cardiopulmonary disease. Electronically Signed   By: Anner Crete M.D.   On:  11/28/2019 02:22    Procedures Procedures (including critical care time)  Medications Ordered in ED Medications  sucralfate (CARAFATE) 1 GM/10ML suspension 1 g (has no administration in time range)  pantoprazole (PROTONIX) EC tablet 40 mg (has no administration in time range)    ED Course  I have reviewed the triage vital signs and the nursing notes.  Pertinent labs & imaging results that were available during my care of the patient were reviewed by me and considered in my medical decision making (see chart for details).    8:53 AM Patient resting in right lateral decubitus position, in no distress, hemodynamically unremarkable.  Young female with IBS presents with epigastric and sternal pain. She is awake, alert, speaking clearly, has no other substantial medical problems, does not smoke, has minimal risk profile for atypical ACS, and here her evaluation is reassuring, with unremarkable x-ray, EKG nonischemic, 2 normal troponin, labs reassuring. Patient is not vomiting, is in no distress on several examinations, and given her history, some suspicion for gastroesophageal etiology. No evidence for perforation, infection concurrently. Patient appropriate for initiation of meds here, will follow up with her GI team.  MDM Number of Diagnoses or Management Options Atypical chest pain: new, needed workup Epigastric pain: new, needed workup   Amount and/or Complexity of Data Reviewed Clinical lab tests: reviewed Tests in the radiology section of CPT: reviewed Tests in the medicine section of CPT: reviewed Independent visualization of images, tracings, or specimens: yes  Risk of Complications, Morbidity, and/or Mortality Presenting problems: high Diagnostic procedures: high Management options: high  Critical Care Total time providing critical care: < 30 minutes  Patient Progress Patient  progress: stable  Final Clinical Impression(s) / ED Diagnoses Final diagnoses:  Epigastric pain  Atypical chest pain    Rx / DC Orders ED Discharge Orders         Ordered    sucralfate (CARAFATE) 1 g tablet  3 times daily with meals & bedtime     Discontinue  Reprint    Note to Pharmacy: Take for one week   11/28/19 0852    omeprazole (PRILOSEC) 20 MG capsule  Daily     Discontinue  Reprint     11/28/19 0852           Carmin Muskrat, MD 11/28/19 [phone]

## 2019-11-28 NOTE — ED Notes (Signed)
Patient verbalizes understanding of discharge instructions. Opportunity for questioning and answers were provided. Armband removed by staff, pt discharged from ED.  

## 2019-11-28 NOTE — Discharge Instructions (Signed)
As discussed, your evaluation today has been largely reassuring.  But, it is important that you monitor your condition carefully, and do not hesitate to return to the ED if you develop new, or concerning changes in your condition. ? ?Otherwise, please follow-up with your physician for appropriate ongoing care. ? ?

## 2019-11-28 NOTE — ED Triage Notes (Signed)
Pt c/o CP that about 2 hours ago. Denies N/V and SOB.

## 2019-12-18 ENCOUNTER — Emergency Department (HOSPITAL_COMMUNITY): Payer: 59

## 2019-12-18 ENCOUNTER — Other Ambulatory Visit: Payer: Self-pay

## 2019-12-18 ENCOUNTER — Encounter (HOSPITAL_COMMUNITY): Payer: Self-pay | Admitting: Emergency Medicine

## 2019-12-18 ENCOUNTER — Emergency Department (HOSPITAL_COMMUNITY)
Admission: EM | Admit: 2019-12-18 | Discharge: 2019-12-18 | Disposition: A | Discharge status: Home / Self Care | Payer: 59 | Attending: Emergency Medicine | Admitting: Emergency Medicine

## 2019-12-18 DIAGNOSIS — R079 Chest pain, unspecified: Secondary | ICD-10-CM | POA: Diagnosis present

## 2019-12-18 DIAGNOSIS — R091 Pleurisy: Secondary | ICD-10-CM | POA: Insufficient documentation

## 2019-12-18 LAB — D-DIMER, QUANTITATIVE: D-Dimer, Quant: 0.29 ug/mL-FEU (ref 0.00–0.50)

## 2019-12-18 LAB — BASIC METABOLIC PANEL
Anion gap: 9 (ref 5–15)
BUN: 9 mg/dL (ref 6–20)
CO2: 23 mmol/L (ref 22–32)
Calcium: 9 mg/dL (ref 8.9–10.3)
Chloride: 105 mmol/L (ref 98–111)
Creatinine, Ser: 0.95 mg/dL (ref 0.44–1.00)
GFR calc Af Amer: >60 mL/min (ref >60)
GFR calc non Af Amer: >60 mL/min (ref >60)
Glucose, Bld: 96 mg/dL (ref 70–99)
Potassium: 3.8 mmol/L (ref 3.5–5.1)
Sodium: 137 mmol/L (ref 135–145)

## 2019-12-18 LAB — CBC
HCT: 40.1 % (ref 36.0–46.0)
Hemoglobin: 12.7 g/dL (ref 12.0–15.0)
MCH: 26.7 pg (ref 26.0–34.0)
MCHC: 31.7 g/dL (ref 30.0–36.0)
MCV: 84.4 fL (ref 80.0–100.0)
Platelets: 351 10*3/uL (ref 150–400)
RBC: 4.75 MIL/uL (ref 3.87–5.11)
RDW: 12.8 % (ref 11.5–15.5)
WBC: 8.3 10*3/uL (ref 4.0–10.5)
nRBC: 0 % (ref 0.0–0.2)

## 2019-12-18 LAB — TROPONIN I (HIGH SENSITIVITY)
Troponin I (High Sensitivity): <2 ng/L (ref <18)
Troponin I (High Sensitivity): <2 ng/L (ref <18)

## 2019-12-18 LAB — I-STAT BETA HCG BLOOD, ED (MC, WL, AP ONLY): I-stat hCG, quantitative: <5 m[IU]/mL (ref <5)

## 2019-12-18 MED ORDER — NAPROXEN 375 MG PO TABS
375.0000 mg | ORAL_TABLET | Freq: Two times a day (BID) | ORAL | 0 refills | Status: DC
Start: 2019-12-18 — End: 2020-01-05

## 2019-12-18 MED ORDER — OMEPRAZOLE 20 MG PO CPDR
20.0000 mg | DELAYED_RELEASE_CAPSULE | Freq: Every day | ORAL | 0 refills | Status: DC
Start: 2019-12-18 — End: 2020-01-05

## 2019-12-18 MED ORDER — SODIUM CHLORIDE 0.9% FLUSH: 3.0000 mL | Freq: Once | INTRAVENOUS | Status: DC

## 2019-12-18 NOTE — ED Notes (Signed)
Called pt x3 for vitals, no response. 

## 2019-12-18 NOTE — Discharge Instructions (Addendum)
1.  Continue to take omeprazole daily.  Take this medication 30 minutes before you eat anything in the morning.  After you have eaten a meal, take naproxen.  Take naproxen twice daily with food. 2.  You appear to have pleurisy.  Pleurisy is an inflammation of the coverings of the lungs.  There is no specific tests to confirm this.  This diagnosis is made when you have a typical type of pain and it is often thought to follow a viral illness. 3.  Follow-up with your family doctor.  Discussed your response to treatment.  Your doctor will determine if you need any further testing.  At this time, you have normal heart rate, blood pressure and temperature.  Your labs are normal. 4.  Return to the emergency department if you develop a fever, significantly worsening pain, shortness of breath, lightheadedness or passing out.

## 2019-12-18 NOTE — ED Provider Notes (Signed)
[facility] EMERGENCY DEPARTMENT Provider Note   CSN: [id number] Arrival date & time: 12/18/19  1250     History Chief Complaint  Patient presents with  . Chest Pain    Cheryl Walters is a 29 y.o. female.  HPI Patient reports she had about 3 weeks of chest pain.  She reports it happens every day.  It comes and goes.  Pain may be sharp and brief.  She has experienced it centrally, wrapping around to her left lower chest and occasionally on the right chest.  He has also experienced central burning pain.  No coughing, no shortness of breath.  No fever no chills no body aches.  No lower extremity swelling or calf pain.  Patient got seen in the emergency department about 3 weeks ago.  She reports that she took omeprazole for about a week and took Carafate but did not get any improvement.  She is no longer taking those medications.  She does not associate her symptoms with any particular activity or food.  Patient reports she had Covid in December.  Her symptoms lasted about 2 weeks.  She reports she has still not regained her sense of taste and smell but does not suffer from ongoing cough or shortness of breath.    Past Medical History:  Diagnosis Date  . Acne   . Active preterm labor 10/23/2016  . Headache(784.0)   . Irregular periods   . Pilonidal cyst     Patient Active Problem List   Diagnosis Date Noted  . Abscess 05/23/2018  . IUD (intrauterine device)  09/04/2017  . Blood in stool 06/25/2014  . Acne vulgaris 06/25/2014  . 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 Muscle spasm of back 03/18/2013  . Chronic rhinitis 08/13/2011  . Recurrent headache 08/13/2011  . 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 CHEST PAIN 12/09/2008  . IRREGULAR MENSTRUAL CYCLE 07/31/2007  . ACNE VULGARIS 07/31/2007    Past Surgical History:  Procedure Laterality Date  . BIRTH CONTROL IMPLANT    . CESAREAN SECTION       OB History    Gravida  2   Para  2   Term  1     Preterm  1   AB      Living  2     SAB      TAB      Ectopic      Multiple  0   Live Births  1           Family History  Problem Relation Age of Onset  . Other Mother        irreg periods  . Other Sister        irreg periods  . Sleep apnea Father        on machine  . Breast cancer Maternal Aunt   . Brain cancer Paternal Aunt   . Colon cancer Neg Hx   . Throat cancer Neg Hx   . Kidney disease Neg Hx   . Liver disease Neg Hx   . Diabetes Neg Hx   . Heart disease Neg Hx   . Stomach cancer Neg Hx     Social History   Tobacco Use  . Smoking status: Never Smoker  . Smokeless tobacco: Never Used  Vaping Use  . Vaping Use: Never used  Substance Use Topics  . Alcohol use: Yes    Alcohol/week: 0.0 standard drinks    Comment: rarely  . Drug use:  No    Home Medications Prior to Admission medications   Medication Sig Start Date End Date Taking? Authorizing Provider  levonorgestrel (MIRENA) 20 MCG/24HR IUD 1 each by Intrauterine route once.    [provider]  mesalamine (LIALDA) 1.2 g EC tablet Take 4 tablets (4.8 g total) by mouth daily with breakfast. Patient not taking: Reported on 09/23/2019 08/05/19   Levin Erp, PA  mupirocin cream (BACTROBAN) 2 % Apply 1 application topically 2 (two) times daily. Patient not taking: Reported on 10/28/2019 10/08/19   Henderly, Britni A, PA-C  naproxen (NAPROSYN) 375 MG tablet Take 1 tablet (375 mg total) by mouth 2 (two) times daily. 12/18/19   Charlesetta Shanks, MD  nortriptyline (PAMELOR) 10 MG capsule Take 1 capsule (10 mg total) by mouth at bedtime. Patient not taking: Reported on 09/23/2019 08/20/19   Pieter Partridge, DO  omeprazole (PRILOSEC) 20 MG capsule Take 1 capsule (20 mg total) by mouth daily. Take one tablet daily 11/28/19   Carmin Muskrat, MD  omeprazole (PRILOSEC) 20 MG capsule Take 1 capsule (20 mg total) by mouth daily. 12/18/19   Charlesetta Shanks, MD  rizatriptan (MAXALT) 10 MG tablet Take 1  tablet earliest onset of headache.  May repeat in 2 hours if needed.  Maximum 2 tablets in 24 hours Patient not taking: Reported on 09/23/2019 08/20/19   Pieter Partridge, DO  sucralfate (CARAFATE) 1 g tablet Take 1 tablet (1 g total) by mouth 4 (four) times daily -  with meals and at bedtime. 11/28/19   Carmin Muskrat, MD    Allergies    Patient has no known allergies.  Review of Systems   Review of Systems 10 systems reviewed and negative except as per HPI Physical Exam Updated Vital Signs BP 113/72 (BP Location: Right Arm)   Pulse 72   Temp 98.8 F (37.1 C) (Oral)   Resp 12   SpO2 100%   Physical Exam Constitutional:      Appearance: She is well-developed.  HENT:     Head: Normocephalic and atraumatic.  Eyes:     Extraocular Movements: Extraocular movements intact.     Conjunctiva/sclera: Conjunctivae normal.  Cardiovascular:     Rate and Rhythm: Normal rate and regular rhythm.     Pulses: Normal pulses.     Heart sounds: Normal heart sounds.  Pulmonary:     Effort: Pulmonary effort is normal.     Breath sounds: Normal breath sounds.  Abdominal:     General: Bowel sounds are normal. There is no distension.     Palpations: Abdomen is soft.     Tenderness: There is no abdominal tenderness.  Musculoskeletal:        General: No swelling or tenderness. Normal range of motion.     Cervical back: Neck supple.     Right lower leg: No edema.     Left lower leg: No edema.  Skin:    General: Skin is warm and dry.  Neurological:     Mental Status: She is alert and oriented to person, place, and time.     GCS: GCS eye subscore is 4. GCS verbal subscore is 5. GCS motor subscore is 6.     Coordination: Coordination normal.  Psychiatric:        Mood and Affect: Mood normal.     ED Results / Procedures / Treatments   Labs (all labs ordered are listed, but only abnormal results are displayed) Labs Reviewed  BASIC METABOLIC PANEL  CBC  D-DIMER, QUANTITATIVE (NOT AT [facility])    I-STAT BETA HCG BLOOD, ED ([facility], [facility], [facility] ONLY)  TROPONIN I (HIGH SENSITIVITY)  TROPONIN I (HIGH SENSITIVITY)    EKG EKG Interpretation  Date/Time:  Friday December 18 2019 13:10:36 EDT Ventricular Rate:  73 PR Interval:  156 QRS Duration: 76 QT Interval:  390 QTC Calculation: 429 R Axis:   60 Text Interpretation: Normal sinus rhythm with sinus arrhythmia Cannot rule out Anterior infarct , age undetermined Abnormal ECG no change from previous Confirmed by Charlesetta Shanks [phone]) on 12/18/2019 9:34:37 PM   Radiology No results found.  Procedures Procedures (including critical care time)  Medications Ordered in ED Medications - No data to display  ED Course  I have reviewed the triage vital signs and the nursing notes.  Pertinent labs & imaging results that were available during my care of the patient were reviewed by me and considered in my medical decision making (see chart for details).    MDM Rules/Calculators/A&P                         Patient has been experiencing sharp and migratory chest pains as well as pain with a burning quality.  She otherwise is well without fevers, chills or cough.  Patient reports she had Covid almost 8 months ago.  She reports she has not regained her sense of smell and taste.  She otherwise has been well.  Patient does not have risk factors for coronary artery disease.  EKG shows no ischemic changes, 2 sets of troponins are normal.  D-dimer normal, patient has normal vital signs without tachycardia hypoxia or tachypnea.  Low probability for PE.  At this time, I suspect pleurisy with the variable location of pain without other symptoms.  Patient reports she had tried PPI as prescribed for about a week but was not noticing improvement.  At this time plan will be to continue the PPI continuously and add naproxen for what appears consistent with pleuritic pain.  Return precautions reviewed and follow-up plan reviewed.  Final Clinical Impression(s) / ED  Diagnoses Final diagnoses:  Pleurisy    Rx / DC Orders ED Discharge Orders         Ordered    omeprazole (PRILOSEC) 20 MG capsule  Daily     Discontinue  Reprint     12/18/19 2258    naproxen (NAPROSYN) 375 MG tablet  2 times daily     Discontinue  Reprint     12/18/19 2258           Charlesetta Shanks, MD 12/21/19 1642

## 2019-12-18 NOTE — ED Triage Notes (Signed)
Pt states she was seen here 3 weeks ago for cp, told it was related to acid reflux and was given some meds but states they did not work and she still has chest pain.

## 2019-12-21 ENCOUNTER — Telehealth: Payer: Self-pay | Admitting: *Deleted

## 2019-12-21 NOTE — Telephone Encounter (Signed)
Please advise if okay to schedule in office or if virtual is okay or if you prefer for patient to continue with GI visit.

## 2019-12-21 NOTE — Telephone Encounter (Signed)
Patient spoke with triage nurse on 12/17/2019 at 11:18:36 AM  ---Caller states states she has had chest pains for a few wks, went to the ER 2 wks ago and EKG/x ray were normal, was told the chest pain is related to her IBD. States she was rxd 2 medications and the chest pains haven't been as bad but she is concerned because they are still occurring. Has appt with GI doctor but not until 8/24.  Patient advised to go to ED, patient seem in ED on 12/18/2019.

## 2019-12-22 NOTE — Telephone Encounter (Signed)
If she is concerned I would be happy to see her    Otherwise   See the gi doctor

## 2019-12-23 NOTE — Telephone Encounter (Signed)
Called patient and scheduled her for Monday, 12/28/19 at 9:30am. Patient stated that she would like to come to the office to be evaluated. Patient verbalized an understanding.

## 2019-12-23 NOTE — Telephone Encounter (Signed)
Called patient and she stated that she was going to wait until she saw the GI doctor and she stated that what she was given by the ER on both occassions have not really helped her. Patient stated that she is still having pain between her breasts, pain under left breast, and deep pain in right breast radiating into her back behind right breast. Patient is also having SOB.   Sending as Cheryl Walters, please advise if she needs to be seen.

## 2019-12-23 NOTE — Telephone Encounter (Signed)
So I can see her     But prefer in person    ? appt next week  . ( It is reassuring that  Her heart and blood work tests were  n ormal   At the ed visit . )

## 2019-12-28 ENCOUNTER — Ambulatory Visit: Payer: 59 | Admitting: Internal Medicine

## 2019-12-29 ENCOUNTER — Encounter: Payer: Self-pay | Admitting: Internal Medicine

## 2019-12-29 NOTE — Progress Notes (Signed)
patients  2 children are sicj with cough rhinorrhea  And low grade temp  ocass vomiitng Cheryl Walters  Began illness  Today  With same  ( children seen on video call this am )  NOt for  Work excuse  For illness and advised her to get covid tested   She has not had vaccine   And not planning on this.   Will get tested  , older child aws negative  Last week.

## 2020-01-05 ENCOUNTER — Other Ambulatory Visit (INDEPENDENT_AMBULATORY_CARE_PROVIDER_SITE_OTHER): Payer: 59

## 2020-01-05 ENCOUNTER — Ambulatory Visit (INDEPENDENT_AMBULATORY_CARE_PROVIDER_SITE_OTHER): Payer: 59 | Admitting: Physician Assistant

## 2020-01-05 ENCOUNTER — Encounter: Payer: Self-pay | Admitting: Physician Assistant

## 2020-01-05 VITALS — BP 109/68 | HR 92 | Ht 60.0 in | Wt 176.8 lb

## 2020-01-05 DIAGNOSIS — R079 Chest pain, unspecified: Secondary | ICD-10-CM

## 2020-01-05 DIAGNOSIS — K219 Gastro-esophageal reflux disease without esophagitis: Secondary | ICD-10-CM | POA: Diagnosis not present

## 2020-01-05 DIAGNOSIS — K5120 Ulcerative (chronic) proctitis without complications: Secondary | ICD-10-CM

## 2020-01-05 LAB — HEPATIC FUNCTION PANEL
ALT: 10 U/L (ref 0–35)
AST: 13 U/L (ref 0–37)
Albumin: 4.1 g/dL (ref 3.5–5.2)
Alkaline Phosphatase: 69 U/L (ref 39–117)
Bilirubin, Direct: 0.1 mg/dL (ref 0.0–0.3)
Total Bilirubin: 0.4 mg/dL (ref 0.2–1.2)
Total Protein: 7.6 g/dL (ref 6.0–8.3)

## 2020-01-05 LAB — SEDIMENTATION RATE: Sed Rate: 32 mm/hr — ABNORMAL HIGH (ref 0–20)

## 2020-01-05 LAB — C-REACTIVE PROTEIN: CRP: <1 mg/dL (ref 0.5–20.0)

## 2020-01-05 MED ORDER — PANTOPRAZOLE SODIUM 40 MG PO TBEC
40.0000 mg | DELAYED_RELEASE_TABLET | Freq: Every morning | ORAL | 6 refills | Status: DC
Start: 2020-01-05 — End: 2020-04-11

## 2020-01-05 MED ORDER — MESALAMINE ER 0.375 G PO CP24
1500.0000 mg | ORAL_CAPSULE | Freq: Every morning | ORAL | 11 refills | Status: DC
Start: 2020-01-05 — End: 2020-03-29

## 2020-01-05 NOTE — Patient Instructions (Addendum)
If you are age 31 or older, your body mass index should be between 23-30. Your Body mass index is 34.53 kg/m. If this is out of the aforementioned range listed, please consider follow up with your Primary Care Provider.  If you are age 28 or younger, your body mass index should be between 19-25. Your Body mass index is 34.53 kg/m. If this is out of the aformentioned range listed, please consider follow up with your Primary Care Provider.   Your provider has requested that you go to the basement level for lab work before leaving today. Press "B" on the elevator. The lab is located at the first door on the left as you exit the elevator.  You have been scheduled for an abdominal ultrasound at [facility] (1st floor of hospital) on 01/08/20 at 9:30 am. Please arrive 15 minutes prior to your appointment for registration. Make certain not to have anything to eat or drink starting at midnight prior to your appointment. Should you need to reschedule your appointment, please contact radiology at [phone]. This test typically takes about 30 minutes to perform.  Continue Pantoprazole 40 mg 1 tablet every morning START Apriso 4 capsules in the morning  You have been scheduled to follow up with Dr. Silverio Decamp February 23, 2020 at 3:00 pm

## 2020-01-05 NOTE — Progress Notes (Signed)
Subjective:    Patient ID: Cheryl Walters, female    DOB: 09/12/1990, 29 y.o.   MRN: [id number]  HPI [name] is a pleasant 29 year old female, established with Dr. Silverio Decamp,, she was last seen here in March 2021 by Ellouise Newer, PA-C for follow-up of ulcerative proctitis.  She also had hemorrhoidal complaints at that time.  She was continued on Lialda 4.8 g daily. She was initially diagnosed with proctitis in 2016 at colonoscopy per Dr. Olevia Perches with finding of proctitis from 0 to 5 cm diffusely.  Path showed chronic active inflammation with crypt distortion but no granulomas, and felt consistent with IBD. She comes in today after an ER visit July 2017 with epigastric pain and then returned to the emergency room on 8 6 with chest pain. She says she has been having intermittent chest pains over the past 1-1/2 months.  She was given a PPI by the emergency room but says that did not seem to make any difference and has not continued.  She had chest x-ray done at both ER visits and these were unremarkable.  She describes the pain as occurring randomly, does not seem to be associated with p.o. intake or activity.  And somewhat migratory and at times will radiate into her back.  She describes it as a burning sensation.  Seems to be very fleeting and brief in nature.  No associated nausea or vomiting.  No dysphagia or odynophagia.  Eating without difficulty no nausea.  She denies any regular aspirin or NSAID use, takes occasional Excedrin for headaches. Regarding her proctitis she says she is not been on any medication over the past year.  She will occasionally see a small amount of blood with her bowel movements, sometimes mixed in with the bowel movement.  Her bowel movements are generally somewhat loose though no diarrhea.  She has no complaints of rectal pain or discomfort.  She says she could not afford the Lialda  Review of Systems Pertinent positive and negative review of systems were noted in the  above HPI section.  All other review of systems was otherwise negative.  Outpatient Encounter Medications as of 01/05/2020  Medication Sig  . mesalamine (APRISO) 0.375 g 24 hr capsule Take 4 capsules (1.5 g total) by mouth in the morning.  . pantoprazole (PROTONIX) 40 MG tablet Take 1 tablet (40 mg total) by mouth in the morning.  . [DISCONTINUED] levonorgestrel (MIRENA) 20 MCG/24HR IUD 1 each by Intrauterine route once. (Patient not taking: Reported on 01/05/2020)  . [DISCONTINUED] mesalamine (LIALDA) 1.2 g EC tablet Take 4 tablets (4.8 g total) by mouth daily with breakfast. (Patient not taking: Reported on 09/23/2019)  . [DISCONTINUED] mupirocin cream (BACTROBAN) 2 % Apply 1 application topically 2 (two) times daily. (Patient not taking: Reported on 10/28/2019)  . [DISCONTINUED] naproxen (NAPROSYN) 375 MG tablet Take 1 tablet (375 mg total) by mouth 2 (two) times daily. (Patient not taking: Reported on 01/05/2020)  . [DISCONTINUED] nortriptyline (PAMELOR) 10 MG capsule Take 1 capsule (10 mg total) by mouth at bedtime. (Patient not taking: Reported on 09/23/2019)  . [DISCONTINUED] omeprazole (PRILOSEC) 20 MG capsule Take 1 capsule (20 mg total) by mouth daily. Take one tablet daily (Patient not taking: Reported on 01/05/2020)  . [DISCONTINUED] omeprazole (PRILOSEC) 20 MG capsule Take 1 capsule (20 mg total) by mouth daily. (Patient not taking: Reported on 01/05/2020)  . [DISCONTINUED] rizatriptan (MAXALT) 10 MG tablet Take 1 tablet earliest onset of headache.  May repeat in 2 hours if  needed.  Maximum 2 tablets in 24 hours (Patient not taking: Reported on 09/23/2019)  . [DISCONTINUED] sucralfate (CARAFATE) 1 g tablet Take 1 tablet (1 g total) by mouth 4 (four) times daily -  with meals and at bedtime. (Patient not taking: Reported on 01/05/2020)   No facility-administered encounter medications on file as of 01/05/2020.   No Known Allergies Patient Active Problem List   Diagnosis Date Noted  . Abscess  05/23/2018  . IUD (intrauterine device)  09/04/2017  . Blood in stool 06/25/2014  . Acne vulgaris 06/25/2014  . 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 Muscle spasm of back 03/18/2013  . Chronic rhinitis 08/13/2011  . Recurrent headache 08/13/2011  . 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 CHEST PAIN 12/09/2008  . IRREGULAR MENSTRUAL CYCLE 07/31/2007  . ACNE VULGARIS 07/31/2007   Social History   Socioeconomic History  . Marital status: Married    Spouse name: Not on file  . Number of children: Not on file  . Years of education: Not on file  . Highest education level: Not on file  Occupational History  . Not on file  Tobacco Use  . Smoking status: Never Smoker  . Smokeless tobacco: Never Used  Vaping Use  . Vaping Use: Never used  Substance and Sexual Activity  . Alcohol use: Yes    Alcohol/week: 0.0 standard drinks    Comment: rarely  . Drug use: No  . Sexual activity: Yes  Other Topics Concern  . Not on file  Social History Narrative   [place]    Was Living at home    [facility]   Played basketball in HS no CV pulm signs   Childbirth in January 2012   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[employer]   Not working out of home at this time    NOw living iwht in Pharmacist, hospital and 4 years okd husband    Small quarters at home all day with 32 yo    To go to pre k if gets accepted  Neg  ocass etoh         Social Determinants of Radio broadcast assistant Strain:   . Difficulty of Paying Living Expenses: Not on file  Food Insecurity:   . Worried About Charity fundraiser in the Last Year: Not on file  . Ran Out of Food in the Last Year: Not on file  Transportation Needs:   . Lack of Transportation (Medical): Not on file  . Lack of Transportation (Non-Medical): Not on file  Physical Activity:   . Days of Exercise per Week: Not on file  . Minutes of Exercise per Session: Not on file  Stress:   . Feeling of  Stress : Not on file  Social Connections:   . Frequency of Communication with Friends and Family: Not on file  . Frequency of Social Gatherings with Friends and Family: Not on file  . Attends Religious Services: Not on file  . Active Member of Clubs or Organizations: Not on file  . Attends Archivist Meetings: Not on file  . Marital Status: Not on file  Intimate Partner Violence:   . Fear of Current or Ex-Partner: Not on file  . Emotionally Abused: Not on file  . Physically Abused: Not on file  . Sexually Abused: Not on file    Ms. Delprado's  family history includes Brain cancer in her paternal aunt; Breast cancer in her maternal aunt; Other in her mother and sister; Sleep apnea in her father.      Objective:    Vitals:   01/05/20 1501  BP: 109/68  Pulse: 92  SpO2: 97%    Physical Exam Well-developed well-nourished young African-American female in no acute distress.  Height, Weight 176, BMI 34.5  HEENT; nontraumatic normocephalic, EOMI, PE RR R LA, sclera anicteric. Oropharynx; not done Neck; supple, no JVD Cardiovascular; regular rate and rhythm with S1-S2, no murmur rub or gallop Pulmonary; Clear bilaterally, no chest wall tenderness or costal margin tenderness Abdomen; soft, nontender, nondistended, no palpable mass or hepatosplenomegaly, bowel sounds are active Rectal; not done today Skin; benign exam, no jaundice rash or appreciable lesions Extremities; no clubbing cyanosis or edema skin warm and dry Neuro/Psych; alert and oriented x4, grossly nonfocal mood and affect appropriate       Assessment & Plan:   #79 30 year old African-American female with recent intermittent fleeting somewhat migratory chest pains.  Recent chest x-rays and baseline labs unremarkable in the emergency room Etiology is not clear, this may be GERD related, rule out gallbladder disease.  #2 history of ulcerative proctitis on no medications over the past several months due to  cost. She is not really symptomatic other than seeing intermittent small-volume hematochezia.  Plan Schedule upper abdominal ultrasound Start Protonix 40 mg p.o. every morning AC breakfast.  She was advised to continue the medication for 4 to 6 weeks prior to deciding whether or not it is helpful. If symptoms are persisting she will need EGD with Dr. Silverio Decamp . Start trial of Apriso 0.3754 p.o. every morning, hopefully this may be better covered by her insurance. Check sed rate, CRP and hepatic panel.   Neale Marzette S Yentl Verge PA-C 01/05/2020   Cc: Burnis Medin, MD

## 2020-01-06 ENCOUNTER — Encounter: Payer: Self-pay | Admitting: Physician Assistant

## 2020-01-07 ENCOUNTER — Ambulatory Visit (INDEPENDENT_AMBULATORY_CARE_PROVIDER_SITE_OTHER): Payer: 59 | Admitting: Neurology

## 2020-01-07 ENCOUNTER — Encounter: Payer: Self-pay | Admitting: Neurology

## 2020-01-07 ENCOUNTER — Other Ambulatory Visit: Payer: Self-pay

## 2020-01-07 VITALS — BP 108/72 | HR 72 | Ht 60.0 in | Wt 175.0 lb

## 2020-01-07 DIAGNOSIS — G43009 Migraine without aura, not intractable, without status migrainosus: Secondary | ICD-10-CM

## 2020-01-07 DIAGNOSIS — G44219 Episodic tension-type headache, not intractable: Secondary | ICD-10-CM | POA: Diagnosis not present

## 2020-01-07 NOTE — Patient Instructions (Signed)
  1. Follow up as needed. 2. Limit use of pain relievers to no more than 2 days out of the week.  These medications include acetaminophen, NSAIDs (ibuprofen/Advil/Motrin, naproxen/Aleve, triptans (Imitrex/sumatriptan), Excedrin, and narcotics.  This will help reduce risk of rebound headaches. 3. Be aware of common food triggers:  - Caffeine:  coffee, black tea, cola, Mt. Dew  - Chocolate  - Dairy:  aged cheeses (brie, blue, cheddar, gouda, [place], provolone, [place], Swiss, etc), chocolate milk, buttermilk, sour cream, limit eggs and yogurt  - Nuts, peanut butter  - Alcohol  - Cereals/grains:  FRESH breads (fresh bagels, sourdough, doughnuts), yeast productions  - Processed/canned/aged/cured meats (pre-packaged deli meats, hotdogs)  - MSG/glutamate:  soy sauce, flavor enhancer, pickled/preserved/marinated foods  - Sweeteners:  aspartame (Equal, Nutrasweet).  Sugar and Splenda are okay  - Vegetables:  legumes (lima beans, lentils, snow peas, fava beans, pinto peans, peas, garbanzo beans), sauerkraut, onions, olives, pickles  - Fruit:  avocados, bananas, citrus fruit (orange, lemon, grapefruit), mango  - Other:  Frozen meals, macaroni and cheese 4. Routine exercise 5. Stay adequately hydrated (aim for 64 oz water daily) 6. Keep headache diary 7. Maintain proper stress management 8. Maintain proper sleep hygiene 9. Do not skip meals 10. Consider supplements:  magnesium citrate 442m daily, riboflavin 4047mdaily, coenzyme Q10 10028mhree times daily.

## 2020-01-07 NOTE — Progress Notes (Signed)
NEUROLOGY FOLLOW UP OFFICE NOTE  Cheryl Walters [id number]  HISTORY OF PRESENT ILLNESS: Cheryl Walters is a 29 year old right-handed female who follows up for migraine.  UPDATE: Started nortriptyline in April and prescribed rizatriptan for rescue, however she never started it due to concern of potential side effects.  However, headaches have significantly improved.  She now only has a mild headache once in awhile lasting 20 minutes with Excedrin and only 1 migraine since April.  Current NSAIDS:  none Current analgesics:  Excedrin Current triptans: none Current ergotamine:  none Current anti-emetic:  none Current muscle relaxants:  none Current anti-anxiolytic:  none Current sleep aide:  none Current Antihypertensive medications:  none Current Antidepressant medications:  none Current Anticonvulsant medications:  none Current anti-CGRP:  none Current Vitamins/Herbal/Supplements:  none Current Antihistamines/Decongestants:  none Other therapy:  none Hormone/birth control:  Mirena  Caffeine:  Coffee 1 to 2 times a week.  1 Coke daily Diet:  Drinks 32 oz water daily.  1 Coke daily.  May skip breakfast if she is not hungry. Exercise:  no Depression:  no; Anxiety:  no Other pain:  no Sleep:  Poor.  Difficulty falling asleep.  5-6 hours a night.  HISTORY: Onset:  Her early to mid 52s Location:  holocephalic Quality:  pounding Initial Intensity:  Moderate to severe.  She denies new headache, thunderclap headache  Aura:  none Premonitory Phase:  none Postdrome:  none Associated symptoms:  When severe, there is photophobia, phonophobia and nausea but denies associated visual disturbance or unilateral numbness or weakness. Initial Duration:  30 minutes with Excedrin to all day Initial Frequency:  Daily headache (2 she would classify as migraine/severe) Initial Frequency of abortive medication: Excedrin daily Triggers:  When she feels hot Relieving factors:   Excedrin Activity:  Cannot function with severe migraines However, she went to the chiropractor 2 weeks ago and has since only had one headache in the past 2 weeks.  It lasted all day.  Labs from 08/05/2019 showed elevated WBC of 15.4 on CBC and CMP showed elevated glucose of 121 but she was on prednisone.     Past NSAIDS:  Naproxen; ibuprofen Past analgesics:  Tylenol Past abortive triptans:  Sumatriptan 160m Past abortive ergotamine:  none Past muscle relaxants:  none Past anti-emetic:  Zofran ODT 469mPast antihypertensive medications:  none Past antidepressant medications:  none Past anticonvulsant medications:  topiramate 5067mID (ineffective) Past anti-CGRP:  none Past vitamins/Herbal/Supplements:  none Past antihistamines/decongestants:  none Other past therapies:  none   Family history of headache:  Mom (migraines)  PAST MEDICAL HISTORY: Past Medical History:  Diagnosis Date  . Acne   . Active preterm labor 10/23/2016  . Headache(784.0)   . Inflammatory bowel diseases (IBD)   . Irregular periods   . Pilonidal cyst     MEDICATIONS: Current Outpatient Medications on File Prior to Visit  Medication Sig Dispense Refill  . mesalamine (APRISO) 0.375 g 24 hr capsule Take 4 capsules (1.5 g total) by mouth in the morning. 120 capsule 11  . pantoprazole (PROTONIX) 40 MG tablet Take 1 tablet (40 mg total) by mouth in the morning. 30 tablet 6   No current facility-administered medications on file prior to visit.    ALLERGIES: No Known Allergies  FAMILY HISTORY: Family History  Problem Relation Age of Onset  . Other Mother        irreg periods  . Other Sister        irreg  periods  . Sleep apnea Father        on machine  . Breast cancer Maternal Aunt   . Brain cancer Paternal Aunt   . Colon cancer Neg Hx   . Throat cancer Neg Hx   . Kidney disease Neg Hx   . Liver disease Neg Hx   . Diabetes Neg Hx   . Heart disease Neg Hx   . Stomach cancer Neg Hx      SOCIAL HISTORY: Social History   Socioeconomic History  . Marital status: Married    Spouse name: Not on file  . Number of children: Not on file  . Years of education: Not on file  . Highest education level: Not on file  Occupational History  . Not on file  Tobacco Use  . Smoking status: Never Smoker  . Smokeless tobacco: Never Used  Vaping Use  . Vaping Use: Never used  Substance and Sexual Activity  . Alcohol use: Yes    Alcohol/week: 0.0 standard drinks    Comment: rarely  . Drug use: No  . Sexual activity: Yes  Other Topics Concern  . 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 Difficulty of Paying Living Expenses: Not on file  Food Insecurity:   . Worried About Charity fundraiser in the Last Year: Not on file  . Ran Out of Food in the Last Year: Not on file  Transportation Needs:   . Lack of Transportation (Medical): Not on file  . Lack of Transportation (Non-Medical): Not on file  Physical Activity:   . Days of Exercise per Week: Not on file  . Minutes of Exercise per Session: Not on file  Stress:   . Feeling of Stress : Not on file  Social Connections:   . Frequency of Communication with Friends and Family: Not on file  . Frequency of Social Gatherings with Friends and Family: Not on file  . Attends Religious Services: Not on file  . Active Member of Clubs or Organizations: Not on file  . Attends Archivist Meetings: Not on file  . Marital Status: Not on file   Intimate Partner Violence:   . Fear of Current or Ex-Partner: Not on file  . Emotionally Abused: Not on file  . Physically Abused: Not on file  . Sexually Abused: Not on file   PHYSICAL EXAM: Blood pressure 108/72, pulse 72, height 5' (1.524 m), weight 175 lb (79.4 kg), SpO2 98 %. General: No acute distress.  Patient appears well-groomed.   Head:  Normocephalic/atraumatic Eyes:  Fundi examined but not visualized Neck: supple, no paraspinal tenderness, full range of motion Heart:  Regular rate and rhythm Lungs:  Clear to auscultation bilaterally Back: No  paraspinal tenderness Neurological Exam: alert and oriented to person, place, and time. Attention span and concentration intact, recent and remote memory intact, fund of knowledge intact.  Speech fluent and not dysarthric, language intact.  CN II-XII intact. Bulk and tone normal, muscle strength 5/5 throughout.  Sensation to light touch, temperature and vibration intact.  Deep tendon reflexes 2+ throughout, toes downgoing.  Finger to nose and heel to shin testing intact.  Gait normal, Romberg negative.  IMPRESSION: Migraine without aura Tension-type headache  Overall, all headaches have significantly improved off of medications.  PLAN: Follow up as needed.  Reminded patient to limit use of pain relievers to no more than 2 days out of week to prevent risk of rebound or medication-overuse headache.   Metta Clines, DO  CC: Shanon Ace, MD

## 2020-01-08 ENCOUNTER — Encounter (HOSPITAL_COMMUNITY): Payer: Self-pay

## 2020-01-08 ENCOUNTER — Ambulatory Visit (HOSPITAL_COMMUNITY)
Admission: RE | Admit: 2020-01-08 | Discharge: 2020-01-08 | Disposition: A | Discharge status: Home / Self Care | Payer: 59 | Source: Ambulatory Visit | Attending: Physician Assistant | Admitting: Physician Assistant

## 2020-01-08 DIAGNOSIS — K5120 Ulcerative (chronic) proctitis without complications: Secondary | ICD-10-CM

## 2020-01-08 DIAGNOSIS — K219 Gastro-esophageal reflux disease without esophagitis: Secondary | ICD-10-CM

## 2020-01-08 DIAGNOSIS — R079 Chest pain, unspecified: Secondary | ICD-10-CM

## 2020-01-08 NOTE — Progress Notes (Signed)
Reviewed and agree with documentation and assessment and plan. K. Veena Ayiana Winslett , MD   

## 2020-01-11 ENCOUNTER — Other Ambulatory Visit: Payer: Self-pay

## 2020-01-11 ENCOUNTER — Ambulatory Visit (HOSPITAL_COMMUNITY)
Admission: RE | Admit: 2020-01-11 | Discharge: 2020-01-11 | Disposition: A | Discharge status: Home / Self Care | Payer: 59 | Source: Ambulatory Visit | Attending: Physician Assistant | Admitting: Physician Assistant

## 2020-01-11 DIAGNOSIS — R079 Chest pain, unspecified: Secondary | ICD-10-CM | POA: Insufficient documentation

## 2020-01-11 DIAGNOSIS — K5120 Ulcerative (chronic) proctitis without complications: Secondary | ICD-10-CM | POA: Insufficient documentation

## 2020-01-11 DIAGNOSIS — K219 Gastro-esophageal reflux disease without esophagitis: Secondary | ICD-10-CM | POA: Insufficient documentation

## 2020-02-23 ENCOUNTER — Ambulatory Visit (INDEPENDENT_AMBULATORY_CARE_PROVIDER_SITE_OTHER): Payer: 59 | Admitting: Gastroenterology

## 2020-02-23 ENCOUNTER — Encounter: Payer: Self-pay | Admitting: Gastroenterology

## 2020-02-23 VITALS — BP 118/62 | HR 69 | Ht 60.0 in | Wt 172.0 lb

## 2020-02-23 DIAGNOSIS — K219 Gastro-esophageal reflux disease without esophagitis: Secondary | ICD-10-CM

## 2020-02-23 DIAGNOSIS — K51311 Ulcerative (chronic) rectosigmoiditis with rectal bleeding: Secondary | ICD-10-CM

## 2020-02-23 DIAGNOSIS — K51018 Ulcerative (chronic) pancolitis with other complication: Secondary | ICD-10-CM

## 2020-02-23 DIAGNOSIS — K625 Hemorrhage of anus and rectum: Secondary | ICD-10-CM

## 2020-02-23 MED ORDER — ONDANSETRON HCL 4 MG PO TABS
4.0000 mg | ORAL_TABLET | ORAL | 0 refills | Status: DC
Start: 2020-02-23 — End: 2020-04-11

## 2020-02-23 MED ORDER — SUPREP BOWEL PREP KIT 17.5-3.13-1.6 GM/177ML PO SOLN: 1.0000 | Freq: Once | ORAL | 0 refills | Status: AC

## 2020-02-23 NOTE — Patient Instructions (Signed)
You have been scheduled for a colonoscopy. Please follow written instructions given to you at your visit today.  Please pick up your prep supplies at the pharmacy within the next 1-3 days. If you use inhalers (even only as needed), please bring them with you on the day of your procedure.   You will be prescribed Zofran for nausea for you to take 1 30 minutes prior to the start of your procedure the evening before and also 1 30 minutes the day of your procedure before the start of your second dose   If you are age 18 or older, your body mass index should be between 23-30. Your Body mass index is 33.59 kg/m. If this is out of the aforementioned range listed, please consider follow up with your Primary Care Provider.  If you are age 52 or younger, your body mass index should be between 19-25. Your Body mass index is 33.59 kg/m. If this is out of the aformentioned range listed, please consider follow up with your Primary Care Provider.    Due to recent changes in healthcare laws, you may see the results of your imaging and laboratory studies on MyChart before your provider has had a chance to review them.  We understand that in some cases there may be results that are confusing or concerning to you. Not all laboratory results come back in the same time frame and the provider may be waiting for multiple results in order to interpret others.  Please give [place] 48 hours in order for your provider to thoroughly review all the results before contacting the office for clarification of your results.   I appreciate the  opportunity to care for you  Thank You   Harl Bowie , MD

## 2020-02-23 NOTE — Progress Notes (Signed)
Cheryl Walters    [id number]    12-15-1990  Primary Care Physician:Panosh, Standley Brooking, MD  Referring Physician: Burnis Medin, MD [place],  [place] [id number]   Chief complaint:  Rectal bleeding   HPI:  29 year old very pleasant female here for follow-up visit for ulcerative proctitis  She is having intermittent bright red blood per rectum but overall her symptoms have significantly improved.  Denies any constipation or diarrhea. She is no longer having reflux symptoms are atypical chest pain.  She was taking PPI but is no longer requiring it.  Abdominal ultrasound 01/11/20: Normal  She was initially diagnosed with proctitis in 2016 at colonoscopy per Dr. Olevia Perches with finding of proctitis from 0 to 5 cm diffusely.  Path showed chronic active inflammation with crypt distortion but no granulomas, and felt consistent with IBD.   Outpatient Encounter Medications as of 02/23/2020  Medication Sig  . mesalamine (APRISO) 0.375 g 24 hr capsule Take 4 capsules (1.5 g total) by mouth in the morning.  . pantoprazole (PROTONIX) 40 MG tablet Take 1 tablet (40 mg total) by mouth in the morning. (Patient not taking: Reported on 02/23/2020)   No facility-administered encounter medications on file as of 02/23/2020.    Allergies as of 02/23/2020  . (No Known Allergies)    Past Medical History:  Diagnosis Date  . Acne   . Active preterm labor 10/23/2016  . Headache(784.0)   . Inflammatory bowel diseases (IBD)   . Irregular periods   . Pilonidal cyst     Past Surgical History:  Procedure Laterality Date  . BIRTH CONTROL IMPLANT    . CESAREAN SECTION    . COLONOSCOPY      Family History  Problem Relation Age of Onset  . Other Mother        irreg periods  . Other Sister        irreg periods  . Sleep apnea Father        on machine  . Breast cancer Maternal Aunt   . Brain cancer Paternal Aunt   . Colon cancer Neg Hx   . Throat cancer Neg Hx    . Kidney disease Neg Hx   . Liver disease Neg Hx   . Diabetes Neg Hx   . Heart disease Neg Hx   . Stomach cancer Neg Hx     Social History   Socioeconomic History  . Marital status: Married    Spouse name: Not on file  . Number of children: Not on file  . Years of education: Not on file  . Highest education level: Not on file  Occupational History  . Not on file  Tobacco Use  . Smoking status: Never Smoker  . Smokeless tobacco: Never Used  Vaping Use  . Vaping Use: Never used  Substance and Sexual Activity  . Alcohol use: Yes    Alcohol/week: 0.0 standard drinks    Comment: occasionally  . Drug use: No  . Sexual activity: Yes  Other Topics Concern  . Not on file  Social History Narrative   [place]    Was Living at home    [facility] of 4   Played basketball in HS no CV pulm signs   Childbirth in January 2012   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[employer]  Not working out of home at this time    NOw living iwht in Pharmacist, hospital and 4 years okd husband    Small quarters at home all day with 31 yo    To go to pre k if gets accepted  Neg  ocass etoh   Right Handed   Drinks Caffeine    One Story Home         Social Determinants of Health   Financial Resource Strain:   . Difficulty of Paying Living Expenses: Not on file  Food Insecurity:   . Worried About Charity fundraiser in the Last Year: Not on file  . Ran Out of Food in the Last Year: Not on file  Transportation Needs:   . Lack of Transportation (Medical): Not on file  . Lack of Transportation (Non-Medical): Not on file  Physical Activity:   . Days of Exercise per Week: Not on file  . Minutes of Exercise per Session: Not on file  Stress:   . Feeling of Stress : Not on file  Social Connections:   . Frequency of Communication with Friends and Family: Not on file  . Frequency of Social Gatherings with Friends and Family: Not on file  . Attends Religious  Services: Not on file  . Active Member of Clubs or Organizations: Not on file  . Attends Archivist Meetings: Not on file  . Marital Status: Not on file  Intimate Partner Violence:   . Fear of Current or Ex-Partner: Not on file  . Emotionally Abused: Not on file  . Physically Abused: Not on file  . Sexually Abused: Not on file      Review of systems: All other review of systems negative except as mentioned in the HPI.   Physical Exam: Vitals:   02/23/20 1448  BP: 118/62  Pulse: 69  SpO2: 99%   Body mass index is 33.59 kg/m. Gen:      No acute distress HEENT:  sclera anicteric Abd:      soft, non-tender; no palpable masses, no distension Ext:    No edema Neuro: alert and oriented x 3 Psych: normal mood and affect  Data Reviewed:  Reviewed labs, radiology imaging, old records and pertinent past GI work up   Assessment and Plan/Recommendations:  29 year old very pleasant female here for follow-up visit for ulcerative proctitis and GERD  GERD: Symptoms are stable Use PPI as needed Continue antireflux measures and lifestyle modification  Small volume intermittent rectal bleeding, concerning for persistent proctitis, recently started on Apriso for maintenance of IBD Will schedule for colonoscopy for further evaluation The risks and benefits as well as alternatives of endoscopic procedure(s) have been discussed and reviewed. All questions answered. The patient agrees to proceed.    The patient was provided an opportunity to ask questions and all were answered. The patient agreed with the plan and demonstrated an understanding of the instructions.  Damaris Hippo , MD    CC: Panosh, Standley Brooking, MD

## 2020-02-26 ENCOUNTER — Encounter: Payer: Self-pay | Admitting: Gastroenterology

## 2020-03-07 ENCOUNTER — Telehealth: Payer: Self-pay | Admitting: Gastroenterology

## 2020-03-07 NOTE — Telephone Encounter (Signed)
Spoke with the patient. She does not know of anything that would have caused a return of her symptoms. She is complaining of the chest pain she had before she went on Pantoprazole. She has had it for 2 days. She has not eaten spicy foods, been taking new medications or had any vomiting. She has not tried any OTC either. Should she refill the pantoprazole?

## 2020-03-07 NOTE — Telephone Encounter (Signed)
Pt is requesting a call back from a nurse to discuss her chest pains she continues to have. Pt would like some advice on what to do.

## 2020-03-10 NOTE — Telephone Encounter (Signed)
Tried calling the patient back. Her phone rings then turns to a fast busy signal. No voicemail.

## 2020-03-10 NOTE — Telephone Encounter (Signed)
Agree with restarting Protonix 33m daily, follow antireflux measures. If continues to have persistent symptoms, use carafate suspension 1gm TID prn. Follow up in office visit. Thanks

## 2020-03-11 NOTE — Telephone Encounter (Signed)
Tried calling the patient back. Her phone rings then turns to a fast busy signal. No voicemail.

## 2020-03-22 NOTE — Telephone Encounter (Signed)
Patient is scheduled for an [facility] colonoscopy procedure 03/29/20.

## 2020-03-25 ENCOUNTER — Other Ambulatory Visit: Payer: Self-pay | Admitting: Gastroenterology

## 2020-03-25 LAB — SARS CORONAVIRUS 2 (TAT 6-24 HRS): SARS Coronavirus 2: NEGATIVE

## 2020-03-29 ENCOUNTER — Other Ambulatory Visit: Payer: Self-pay

## 2020-03-29 ENCOUNTER — Encounter: Payer: Self-pay | Admitting: Gastroenterology

## 2020-03-29 ENCOUNTER — Ambulatory Visit (AMBULATORY_SURGERY_CENTER): Payer: 59 | Admitting: Gastroenterology

## 2020-03-29 VITALS — BP 113/80 | HR 54 | Temp 97.0°F | Resp 13 | Ht 60.0 in | Wt 172.0 lb

## 2020-03-29 DIAGNOSIS — K51311 Ulcerative (chronic) rectosigmoiditis with rectal bleeding: Secondary | ICD-10-CM

## 2020-03-29 HISTORY — PX: COLONOSCOPY: SHX174

## 2020-03-29 MED ORDER — MESALAMINE 1000 MG RE SUPP: 1000.0000 mg | Freq: Every day | RECTAL | 0 refills | Status: DC

## 2020-03-29 MED ORDER — SODIUM CHLORIDE 0.9 % IV SOLN: 500.0000 mL | Freq: Once | INTRAVENOUS | Status: DC

## 2020-03-29 NOTE — Progress Notes (Signed)
Called to room to assist during endoscopic procedure.  Patient ID and intended procedure confirmed with present staff. Received instructions for my participation in the procedure from the performing physician.  

## 2020-03-29 NOTE — Patient Instructions (Addendum)
HANDOUTS PROVIDED ON:  hemorrhoids  The biopsies taken today have been sent for pathology.  The results can take 1-3 weeks to receive.  When your next colonoscopy should occur will be based on the pathology results.    You may resume your previous diet and medication schedule. Benefiber one teaspoon by mouth 3 times a day.  Change in prescription from by mouth Canasa to Canasa 1062m suppository 1 per rectum at bedtime for 4 weeks.  Thank you for allowing [facility] for you today!!!   YOU HAD AN ENDOSCOPIC PROCEDURE TODAY AT [facility]   Refer to the procedure report that was given to you for any specific questions about what was found during the examination.  If the procedure report does not answer your questions, please call your gastroenterologist to clarify.  If you requested that your care partner not be given the details of your procedure findings, then the procedure report has been included in a sealed envelope for you to review at your convenience later.  YOU SHOULD EXPECT: Some feelings of bloating in the abdomen. Passage of more gas than usual.  Walking can help get rid of the air that was put into your GI tract during the procedure and reduce the bloating. If you had a lower endoscopy (such as a colonoscopy or flexible sigmoidoscopy) you may notice spotting of blood in your stool or on the toilet paper. If you underwent a bowel prep for your procedure, you may not have a normal bowel movement for a few days.  Please Note:  You might notice some irritation and congestion in your nose or some drainage.  This is from the oxygen used during your procedure.  There is no need for concern and it should clear up in a day or so.  SYMPTOMS TO REPORT IMMEDIATELY:   Following lower endoscopy (colonoscopy or flexible sigmoidoscopy):  Excessive amounts of blood in the stool  Significant tenderness or worsening of abdominal pains  Swelling of the abdomen that is new, acute  Fever  of 100F or higher   For urgent or emergent issues, a gastroenterologist can be reached at any hour by calling ([phone] Do not use MyChart messaging for urgent concerns.    DIET:  We do recommend a small meal at first, but then you may proceed to your regular diet.  Drink plenty of fluids but you should avoid alcoholic beverages for 24 hours.  ACTIVITY:  You should plan to take it easy for the rest of today and you should NOT DRIVE or use heavy machinery until tomorrow (because of the sedation medicines used during the test).    FOLLOW UP: Our staff will call the number listed on your records 48-72 hours following your procedure to check on you and address any questions or concerns that you may have regarding the information given to you following your procedure. If we do not reach you, we will leave a message.  We will attempt to reach you two times.  During this call, we will ask if you have developed any symptoms of COVID 19. If you develop any symptoms (ie: fever, flu-like symptoms, shortness of breath, cough etc.) before then, please call [phone]  If you test positive for Covid 19 in the 2 weeks post procedure, please call and report this information to [facility]    If any biopsies were taken you will be contacted by phone or by letter within the next 1-3 weeks.  Please call [facility]  at [phone] if you have not heard about the biopsies in 3 weeks.    SIGNATURES/CONFIDENTIALITY: You and/or your care partner have signed paperwork which will be entered into your electronic medical record.  These signatures attest to the fact that that the information above on your After Visit Summary has been reviewed and is understood.  Full responsibility of the confidentiality of this discharge information lies with you and/or your care-partner.

## 2020-03-29 NOTE — Op Note (Addendum)
[facility] Patient Name: Cheryl Walters Procedure Date: 03/29/2020 2:05 PM MRN: [id number] Endoscopist: Mauri Pole , MD Age: 29 Referring MD:  Date of Birth: Feb 14, 1991 Gender: Female Account #: [id number] Procedure:                Colonoscopy Indications:              Determine extent and severity of inflammatory bowel                            disease, Obtain more precise diagnosis of                            inflammatory bowel disease, h/o ulcerative                            proctitis with persistent rectal bleeding. Medicines:                Monitored Anesthesia Care Procedure:                Pre-Anesthesia Assessment:                           - Prior to the procedure, a History and Physical                            was performed, and patient medications and                            allergies were reviewed. The patient's tolerance of                            previous anesthesia was also reviewed. The risks                            and benefits of the procedure and the sedation                            options and risks were discussed with the patient.                            All questions were answered, and informed consent                            was obtained. Prior Anticoagulants: The patient has                            taken no previous anticoagulant or antiplatelet                            agents. ASA Grade Assessment: II - A patient with                            mild systemic disease. After reviewing the risks  and benefits, the patient was deemed in                            satisfactory condition to undergo the procedure.                           After obtaining informed consent, the colonoscope                            was passed under direct vision. Throughout the                            procedure, the patient's blood pressure, pulse, and                            oxygen saturations were  monitored continuously. The                            Colonoscope was introduced through the anus and                            advanced to the the terminal ileum, with                            identification of the appendiceal orifice and IC                            valve. The colonoscopy was performed without                            difficulty. The patient tolerated the procedure                            well. The quality of the bowel preparation was                            excellent. The terminal ileum, ileocecal valve,                            appendiceal orifice, and rectum were photographed. Scope In: 3:08:47 PM Scope Out: 3:24:37 PM Scope Withdrawal Time: 0 hours 11 minutes 27 seconds  Total Procedure Duration: 0 hours 15 minutes 50 seconds  Findings:                 The perianal and digital rectal examinations were                            normal.                           Normal mucosa was found in the sigmoid colon, in                            the descending colon, in the transverse colon, in  the ascending colon, in the cecum and at the                            ileocecal valve. Biopsies were taken with a cold                            forceps for histology.                           The terminal ileum appeared normal.                           A continuous area of ulcerated mucosa with stigmata                            of recent bleeding was present in the rectum.                            Biopsies were taken with a cold forceps for                            histology.                           Non-bleeding internal hemorrhoids were found during                            retroflexion. The hemorrhoids were medium-sized. Complications:            No immediate complications. Estimated Blood Loss:     Estimated blood loss was minimal. Impression:               - Normal mucosa in the sigmoid colon, in the                             descending colon, in the transverse colon, in the                            ascending colon, in the cecum and at the ileocecal                            valve. Biopsied.                           - The examined portion of the ileum was normal.                           - Mucosal ulceration. Biopsied.                           - Non-bleeding internal hemorrhoids. Recommendation:           - Patient has a contact number available for                            emergencies. The signs and symptoms of  potential                            delayed complications were discussed with the                            patient. Return to normal activities tomorrow.                            Written discharge instructions were provided to the                            patient.                           - Resume previous diet.                           - Continue present medications.                           - Await pathology results.                           - Repeat colonoscopy in 5 years for surveillance                            based on pathology results.                           - Use Benefiber one teaspoon PO TID.                           - Use Canasa 1000 mg suppository 1 per rectum QHS                            for 4 weeks.                           - Follow up in office visit in 4-6 weeks with APP                            or Dr Silverio Decamp for GERD, atypical chest pain (may                            consider EGD with 48hr pH Bravo study for further                            evaluation) and ulcerive proctitis. Please call to                            schedule appointment Mauri Pole, MD 03/29/2020 3:32:00 PM This report has been signed electronically.

## 2020-03-29 NOTE — Progress Notes (Signed)
Pt's states no medical or surgical changes since previsit or office visit.  ° °Vitals CW °

## 2020-03-29 NOTE — Progress Notes (Signed)
Report given to PACU, vss 

## 2020-03-31 ENCOUNTER — Other Ambulatory Visit: Payer: Self-pay

## 2020-03-31 ENCOUNTER — Telehealth: Payer: Self-pay | Admitting: *Deleted

## 2020-03-31 ENCOUNTER — Telehealth: Payer: Self-pay

## 2020-03-31 MED ORDER — DICYCLOMINE HCL 20 MG PO TABS: 20.0000 mg | ORAL_TABLET | Freq: Three times a day (TID) | ORAL | 1 refills | Status: DC | PRN

## 2020-03-31 NOTE — Telephone Encounter (Signed)
Attempted f/u phone call. No answer. Voicemail box not set up, unable to leave message.

## 2020-03-31 NOTE — Telephone Encounter (Signed)
Called patient and she was not able to pick-up the [employer]. On 03/29/20 because the pharmacy was out of them. They have them in now and she states she will pick the suppositories and Dicyclomine after work. I gave instructions for taking both. She seemed ok when I spoke to her.

## 2020-03-31 NOTE — Telephone Encounter (Signed)
Dr. Silverio Decamp,  This pt called back today and states she is having abdominal pain, rating as a "5."  She describes it as "rumbling in my stomach, like I have to use the bathroom but can't."  No fever, abdominal distention noted.  She states, "I had a little blood in my urine last night."  No actual rectal bleeding or clots noted.  I told her might still be some trapped air from the procedure. I encouraged her to ambulated as much as possible and drink warm fluids. She also c/o chest and said that she mentioned that when she was here on Tuesday.  It has not changed or gotten worse; she has been seen in the ED previously for this. I encouraged her to call her PCP and see who they would like to refer her to, or if they would like to see her.   I asked her if she needed to go to the ED and she states no, it's not "bad right now.  I will call her back later to see how she is.  Is there anything else you advise?  Thanks, [employer]

## 2020-03-31 NOTE — Telephone Encounter (Signed)
Pt having stomach pain since procedure, please call to advise

## 2020-03-31 NOTE — Telephone Encounter (Signed)
Mailbox full

## 2020-03-31 NOTE — Telephone Encounter (Signed)
Called patient back, didn't reach and couldn't even leave a voicemail. Beth, can you please call her back this afternoon to check? Small volume bleeding from biopsies and proctitis. Please advise her to use Canasa suppositories, Rx sent after procedure Use Dicyclomine 34m Q8h prn for abdominal cramping and bloating as needed. Please send Rx for 90 tabs with 1 refill.  Atypical chest pain, multiple ER visits. No clear etiology. Advised patient to follow up with PMD and consider further work up with Cardiology as needed. Thanks

## 2020-04-01 ENCOUNTER — Telehealth: Payer: Self-pay

## 2020-04-01 DIAGNOSIS — R079 Chest pain, unspecified: Secondary | ICD-10-CM

## 2020-04-01 NOTE — Telephone Encounter (Signed)
Okay for referral?

## 2020-04-01 NOTE — Telephone Encounter (Signed)
I have no information about this  Get info about reason for referral   Is it for the  recurrent chest pain for which she has evaluation in ed  And other ?  If so   Go ahead and refer  To cardiology for atypical chest pain   If a new problem and concern  Make a visit  ( last visit with me was  4 2021)

## 2020-04-01 NOTE — Telephone Encounter (Signed)
Patient called in stating that the GI doctor that she went and seen has told her that her PCP needs to send a referral to a cardiologist ASAP    Please call and advise  And let her know if a office visit needs to be done prior to referral sent out

## 2020-04-05 NOTE — Telephone Encounter (Signed)
Also tried calling pt, unable to leave voicemail. Mychart message sent.

## 2020-04-10 ENCOUNTER — Encounter: Payer: Self-pay | Admitting: Cardiovascular Disease

## 2020-04-10 NOTE — Progress Notes (Signed)
Cardiology Office Note:    Date:  04/11/2020   ID:  Cheryl Walters, DOB 1990-06-09, MRN [id number]  PCP:  Cheryl Medin, MD  [facility] Cardiologist:  No primary care provider on file.  [facility] Electrophysiologist:  None   Referring MD: Cheryl Medin, MD   Chief Complaint  Patient presents with  . Chest Pain    Nov. 29, 2021:    Cheryl Walters is a 29 y.o. female with a hx of  Obesity,  Chest pain . We were asked to see her today for further evaluation of this chest pain by Dr. Regis Walters.  CP occurs spontaneously  Off and on for months  Will last 15 - 20 min Not related to exercise, not related to eating or drinking.  Not related to twisting or turning her torso. No related to belching   Feels like a sharp jab,   Resolves on its own.   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 Eats fried foods,  Has IBS,  colonsocopy for IBS several weeks ago,  Looked ok   +/-  pleuretic,    But not always .   Past Medical History:  Diagnosis Date  . Acne   . Active preterm labor 10/23/2016  . Headache(784.0)   . Inflammatory bowel diseases (IBD)   . Irregular periods   . Pilonidal cyst     Past Surgical History:  Procedure Laterality Date  . BIRTH CONTROL IMPLANT    . CESAREAN SECTION    . COLONOSCOPY  03/29/2020   2016    Current Medications: Current Meds  Medication Sig  . dicyclomine (BENTYL) 20 MG tablet Take 1 tablet (20 mg total) by mouth every 8 (eight) hours as needed for spasms (abdominal cramping/bloating).  . mesalamine (CANASA) 1000 MG suppository Place 1 suppository (1,000 mg total) rectally at bedtime.  . [DISCONTINUED] ondansetron (ZOFRAN) 4 MG tablet Take 1 tablet (4 mg total) by mouth as directed. Take 1 tablet 30 minutes prior to the start of colonoscopy prep and 1 before 2nd dose of prep  Use the other 2 tablets as needed  . [DISCONTINUED]  pantoprazole (PROTONIX) 40 MG tablet Take 1 tablet (40 mg total) by mouth in the morning.   Current Facility-Administered Medications for the 04/11/20 encounter (Office Visit) with Cheryl Walters, Cheryl Cheng, MD  Medication  . 0.9 %  sodium chloride infusion     Allergies:   Patient has no known allergies.   Social History   Socioeconomic History  . Marital status: Married    Spouse name: Not on file  . Number of children: Not on file  . Years of education: Not on file  . Highest education level: Not on file  Occupational History  . Not on file  Tobacco Use  . Smoking status: Never Smoker  . Smokeless tobacco: Never Used  Vaping Use  . Vaping Use: Never used  Substance and Sexual Activity  . Alcohol use: Yes    Alcohol/week: 0.0 standard drinks    Comment: occasionally  . Drug use: No  . Sexual activity: Yes  Other Topics Concern  . Not on file  Social History Narrative   [place]    Was Living at home    [facility] of [facility]   Played basketball in HS no CV pulm signs   Childbirth in January 2012  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[employer]   Not working out of home at this time    NOw living iwht in Pharmacist, hospital and 4 years okd husband    Small quarters at home all day with 31 yo    To go to pre k if gets accepted  Neg  ocass etoh   Right Handed   Drinks Caffeine    One Story Home         Social Determinants of Health   Financial Resource Strain:   . Difficulty of Paying Living Expenses: Not on file  Food Insecurity:   . Worried About Charity fundraiser in the Last Year: Not on file  . Ran Out of Food in the Last Year: Not on file  Transportation Needs:   . Lack of Transportation (Medical): Not on file  . Lack of Transportation (Non-Medical): Not on file  Physical Activity:   . Days of Exercise per Week: Not on file  . Minutes of Exercise per Session: Not on file  Stress:   . Feeling of Stress : Not on file  Social  Connections:   . Frequency of Communication with Friends and Family: Not on file  . Frequency of Social Gatherings with Friends and Family: Not on file  . Attends Religious Services: Not on file  . Active Member of Clubs or Organizations: Not on file  . Attends Archivist Meetings: Not on file  . Marital Status: Not on file     Family History: The patient's family history includes Brain cancer in her paternal aunt; Breast cancer in her maternal aunt; Other in her mother and sister; Sleep apnea in her father. There is no history of Colon cancer, Throat cancer, Kidney disease, Liver disease, Diabetes, Heart disease, or Stomach cancer.  ROS:   Please see the history of present illness.     All other systems reviewed and are negative.  EKGs/Labs/Other Studies Reviewed:    The following studies were reviewed today:   EKG:   April 11, 2020: Normal sinus rhythm at 65.  No ST or T wave abnormalities.  Recent Labs: 12/18/2019: BUN 9; Creatinine, Ser 0.95; Hemoglobin 12.7; Platelets 351; Potassium 3.8; Sodium 137 01/05/2020: ALT 10  Recent Lipid Panel    Component Value Date/Time   CHOL 192 12/03/2007 1039   TRIG 37 12/03/2007 1039   HDL 83.2 12/03/2007 1039   CHOLHDL 2.3 CALC 12/03/2007 1039   VLDL 7 12/03/2007 1039   LDLCALC 101 (H) 12/03/2007 1039     Risk Assessment/Calculations:       Physical Exam:    VS:  BP (!) 88/62   Pulse 65   Ht 5' (1.524 m)   Wt 170 lb (77.1 kg)   SpO2 99%   BMI 33.20 kg/m     Wt Readings from Last 3 Encounters:  04/11/20 170 lb (77.1 kg)  03/29/20 172 lb (78 kg)  02/23/20 172 lb (78 kg)     GEN:  Young, moderately obese female,  NAD  HEENT: Normal NECK: No JVD; No carotid bruits LYMPHATICS: No lymphadenopathy CARDIAC: RRR, no murmurs, rubs, gallops, no chest wall tenderness  RESPIRATORY:  Clear to auscultation without rales, wheezing or rhonchi  ABDOMEN: Soft, non-tender, non-distended MUSCULOSKELETAL:  No edema; No  deformity  SKIN: Warm and dry NEUROLOGIC:  Alert and oriented x 3 PSYCHIATRIC:  Normal affect  ASSESSMENT:    1. Atypical chest pain    PLAN:    In order of problems listed above:  1. Chest pain: Cheryl Walters  of presents with episodes of atypical chest pain.  The pain is not exertional.  It is described as a sharp pain but might last anywhere from 10 to 20 minutes.  If not related to food, bending or twisting or taking a deep breath.  She occasionally will have some worsening of her pain if she is already has pain and takes a deep breath.  I suspect that her symptoms will improve as she improves her diet.  She eats a fair amount of greasy foods.  We will have her return for graded exercise test.  We will plan on seeing her on an as-needed basis unless the GXT is abnormal .   We will have her follow-up with Dr. Regis Walters.    Shared Decision Making/Informed Consent    The risks [chest pain, shortness of breath, cardiac arrhythmias, dizziness, blood pressure fluctuations, myocardial infarction, stroke/transient ischemic attack, and life-threatening complications (estimated to be 1 in 10,000)], benefits (risk stratification, diagnosing coronary artery disease, treatment guidance) and alternatives of an exercise tolerance test were discussed in detail with Ms. Waldren and she agrees to proceed.      Medication Adjustments/Labs and Tests Ordered: Current medicines are reviewed at length with the patient today.  Concerns regarding medicines are outlined above.  Orders Placed This Encounter  Procedures  . Exercise Tolerance Test  . EKG 12-Lead   No orders of the defined types were placed in this encounter.   Patient Instructions  Medication Instructions:  Your physician recommends that you continue on your current medications as directed. Please refer to the Current Medication list given to you today.  *If you need a refill on your cardiac medications before your next appointment,  please call your pharmacy*   Lab Work: None If you have labs (blood work) drawn today and your tests are completely normal, you will receive your results only by: Marland Kitchen MyChart Message (if you have MyChart) OR . A paper copy in the mail If you have any lab test that is abnormal or we need to change your treatment, we will call you to review the results.   Testing/Procedures: Your physician has requested that you have an exercise tolerance test. For further information please visit [email or web address]. Please also follow instruction sheet, as given.   Follow-Up: At [facility], you and your health needs are our priority.  As part of our continuing mission to provide you with exceptional heart care, we have created designated Provider Care Teams.  These Care Teams include your primary Cardiologist (physician) and Advanced Practice Providers (APPs -  Physician Assistants and Nurse Practitioners) who all work together to provide you with the care you need, when you need it.  We recommend signing up for the patient portal called "MyChart".  Sign up information is provided on this After Visit Summary.  MyChart is used to connect with patients for Virtual Visits (Telemedicine).  Patients are able to view lab/test results, encounter notes, upcoming appointments, etc.  Non-urgent messages can be sent to your provider as well.   To learn more about what you can do with MyChart, go to [email or web address].    Your next appointment:   As needed  The format for your next appointment:   In Person  Provider:   You may see Dr. Mertie Moores or one of the following Advanced Practice Providers on  your designated Care Team:    Richardson Dopp, PA-C  Robbie Lis, [place]    Other Instructions      Signed, Mertie Moores, MD  04/11/2020 5:21 PM    [place]

## 2020-04-11 ENCOUNTER — Encounter: Payer: Self-pay | Admitting: Cardiovascular Disease

## 2020-04-11 ENCOUNTER — Other Ambulatory Visit: Payer: Self-pay

## 2020-04-11 ENCOUNTER — Ambulatory Visit (INDEPENDENT_AMBULATORY_CARE_PROVIDER_SITE_OTHER): Payer: 59 | Admitting: Cardiovascular Disease

## 2020-04-11 VITALS — BP 88/62 | HR 65 | Ht 60.0 in | Wt 170.0 lb

## 2020-04-11 DIAGNOSIS — R079 Chest pain, unspecified: Secondary | ICD-10-CM | POA: Diagnosis not present

## 2020-04-11 DIAGNOSIS — R0789 Other chest pain: Secondary | ICD-10-CM | POA: Diagnosis not present

## 2020-04-11 NOTE — Patient Instructions (Signed)
Medication Instructions:  Your physician recommends that you continue on your current medications as directed. Please refer to the Current Medication list given to you today.  *If you need a refill on your cardiac medications before your next appointment, please call your pharmacy*   Lab Work: None If you have labs (blood work) drawn today and your tests are completely normal, you will receive your results only by: Marland Kitchen MyChart Message (if you have MyChart) OR . A paper copy in the mail If you have any lab test that is abnormal or we need to change your treatment, we will call you to review the results.   Testing/Procedures: Your physician has requested that you have an exercise tolerance test. For further information please visit [email or web address]. Please also follow instruction sheet, as given.   Follow-Up: At [facility], you and your health needs are our priority.  As part of our continuing mission to provide you with exceptional heart care, we have created designated Provider Care Teams.  These Care Teams include your primary Cardiologist (physician) and Advanced Practice Providers (APPs -  Physician Assistants and Nurse Practitioners) who all work together to provide you with the care you need, when you need it.  We recommend signing up for the patient portal called "MyChart".  Sign up information is provided on this After Visit Summary.  MyChart is used to connect with patients for Virtual Visits (Telemedicine).  Patients are able to view lab/test results, encounter notes, upcoming appointments, etc.  Non-urgent messages can be sent to your provider as well.   To learn more about what you can do with MyChart, go to [email or web address].    Your next appointment:   As needed  The format for your next appointment:   In Person  Provider:   You may see Dr. Mertie Moores or one of the following Advanced Practice Providers on your designated Care Team:    Richardson Dopp,  PA-C  Robbie Lis, [place]    Other Instructions

## 2020-04-18 ENCOUNTER — Encounter: Payer: Self-pay | Admitting: Gastroenterology

## 2020-05-20 ENCOUNTER — Other Ambulatory Visit (HOSPITAL_COMMUNITY): Payer: 59

## 2020-05-23 ENCOUNTER — Telehealth: Payer: Self-pay | Admitting: Cardiovascular Disease

## 2020-05-23 NOTE — Telephone Encounter (Signed)
Patient was schedule for 05-24-20 - called cancel the appointment on 05-19-20.  Patient stated she is feeling better and don't  need the test at this time.  Order will be cancel.

## 2020-05-23 NOTE — Telephone Encounter (Signed)
Will forward this message as a general FYI to pts Primary and ordering Cardiologist, Dr. Acie Fredrickson.

## 2020-06-02 NOTE — Progress Notes (Deleted)
   I, Peterson Lombard, LAT, ATC acting as a scribe for [name], MD.  Cheryl Walters is a 30 y.o. female who presents to [place] at [facility] today for R shoulder pain due to [facility] degeneration and infraspinatus tendinopathy. Pt was last seen by Dr. Georgina Snell on 10/28/19 for f/u and to review her R shoulder MRI and advised to con't watchful waiting.  She completed a course of PT and was d/c from PT on 10/21/19.  Today, pt reports  Dx imaging: R shoulder XR - 10/21/19  R shoulder MRI - 10/25/19  Pertinent review of systems: ***  Relevant historical information: ***   Exam:  There were no vitals taken for this visit. General: Well Developed, well nourished, and in no acute distress.   MSK: ***    Lab and Radiology Results No results found for this or any previous visit (from the past 72 hour(s)). No results found.     Assessment and Plan: 30 y.o. female with ***   PDMP not reviewed this encounter. No orders of the defined types were placed in this encounter.  No orders of the defined types were placed in this encounter.    Discussed warning signs or symptoms. Please see discharge instructions. Patient expresses understanding.   ***

## 2020-06-03 ENCOUNTER — Ambulatory Visit: Payer: 59 | Admitting: Family Medicine

## 2020-06-09 NOTE — Progress Notes (Signed)
   I, Peterson Lombard, LAT, ATC acting as a scribe for [name], MD.  Cheryl Walters is a 30 y.o. 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 Pt was last seen by Dr. Georgina Snell on 10/28/19 and was advised to resume activity as tolerated. Today, pt reports increased shoulder pain. Pt locates pain to anterior aspect of shoulder. Of note, pt works at a bowling alley and bowls competitively.  Dx imaging: 10/25/19 R shoulder MRI  10/21/19 R shoulder XR  Pertinent review of systems: No fevers or chills  Relevant historical information: History of sleep disturbance.   Exam:  BP 120/76 (BP Location: Right Arm, Patient Position: Sitting, Cuff Size: Normal)   Pulse 80   Ht 5' (1.524 m)   Wt 169 lb 3.2 oz (76.7 kg)   SpO2 97%   BMI 33.04 kg/m  General: Well Developed, well nourished, and in no acute distress.   MSK: Right shoulder normal-appearing Nontender. Range of motion normal abduction and external rotation. Internal rotation limited to lumbar spine. Strength intact abduction external and internal rotation. Negative Hawkins and Neer's test.  Positively painful internal rotation press off test. Negative Yergason's and speeds test.    Lab and Radiology Results  Diagnostic Limited MSK Ultrasound of: Right shoulder Biceps tendon intact normal. Subscapularis tendon is normal. Supraspinatus tendon is intact and normal. Subacromial bursa slightly thick indicating subacromial bursitis. Infraspinatus tendon intact normal. AC joint mild effusion. Impression: Mild subacromial bursitis     Assessment and Plan: 30 y.o. female with mild subacromial bursitis mostly affecting internal rotation.. This seems to be caused by her bowling. Discussed activity modification. Plan for physical therapy. Recheck if not improving.   PDMP not reviewed this  encounter. Orders Placed This Encounter  Procedures  . [place] LIMITED JOINT SPACE STRUCTURES UP RIGHT(NO LINKED CHARGES)    Standing Status:   Future    Number of Occurrences:   1    Standing Expiration Date:   12/08/2020    Order Specific Question:   Reason for Exam (SYMPTOM  OR DIAGNOSIS REQUIRED)    Answer:   chronic right shoulder pain    Order Specific Question:   Preferred imaging location?    Answer:   [place]  . Ambulatory referral to Physical Therapy    Referral Priority:   Routine    Referral Type:   Physical Medicine    Referral Reason:   Specialty Services Required    Requested Specialty:   Physical Therapy   No orders of the defined types were placed in this encounter.    Discussed warning signs or symptoms. Please see discharge instructions. Patient expresses understanding.   The above documentation has been reviewed and is accurate and complete [name], M.D.

## 2020-06-10 ENCOUNTER — Ambulatory Visit (INDEPENDENT_AMBULATORY_CARE_PROVIDER_SITE_OTHER): Payer: 59 | Admitting: Family Medicine

## 2020-06-10 ENCOUNTER — Other Ambulatory Visit: Payer: Self-pay

## 2020-06-10 ENCOUNTER — Ambulatory Visit: Payer: Self-pay

## 2020-06-10 VITALS — BP 120/76 | HR 80 | Ht 60.0 in | Wt 169.2 lb

## 2020-06-10 DIAGNOSIS — M25511 Pain in right shoulder: Secondary | ICD-10-CM | POA: Diagnosis not present

## 2020-06-10 DIAGNOSIS — M25512 Pain in left shoulder: Secondary | ICD-10-CM

## 2020-06-10 NOTE — Patient Instructions (Signed)
Thank you for coming in today.  I've referred you to Physical Therapy.  Let us know if you don't hear from them in one week.  Recheck with me if not improved with PT.   Ok to bowl if you are not hurting a lot.

## 2020-06-29 ENCOUNTER — Ambulatory Visit: Payer: 59 | Admitting: Physical Therapy

## 2020-07-08 IMAGING — MR MR SHOULDER*R* W/O CM
5 series · 40 of 40 positions shown · non-contrast
Comparison: Radiographs from 10/21/2019

CLINICAL DATA: Right shoulder pain, reduced range of motion when
raising the arm over the last 2 months

EXAM:
MRI OF THE RIGHT SHOULDER WITHOUT CONTRAST
TECHNIQUE: Multiplanar, multisequence MR imaging of the shoulder was performed.
No intravenous contrast was administered.

[Series 6: PD fat-sat · axial · 4.0mm · 0.59mm/px · z∈[-11,+68]mm · 8 of 20 slices shown (1 of 2)]
[im 1/20]
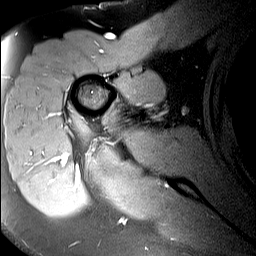
[im 3/20]
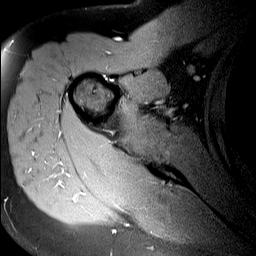
[im 6/20]
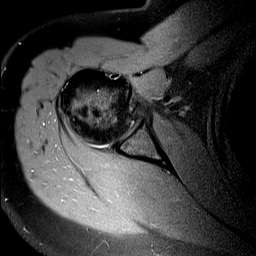
[im 9/20]
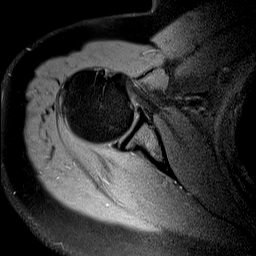
[im 11/20]
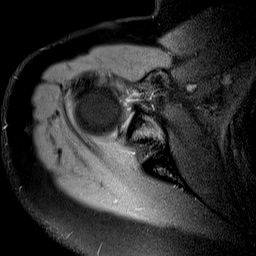
[im 14/20]
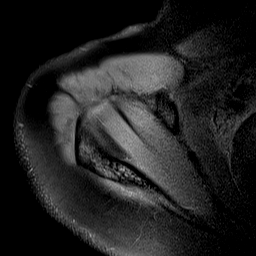
[im 17/20]
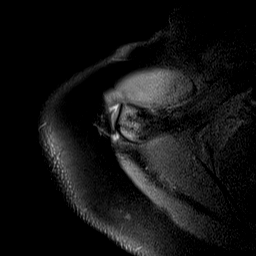
[im 20/20]
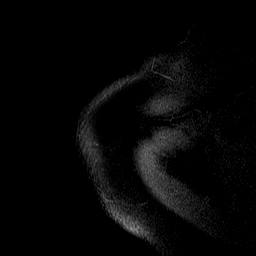

[Series 7: T2 fat-sat · oblique · 4.0mm · 0.59mm/px · 7 of 19 slices shown (1 of 2)]
[im 1/19]
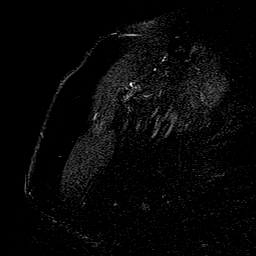
[im 4/19]
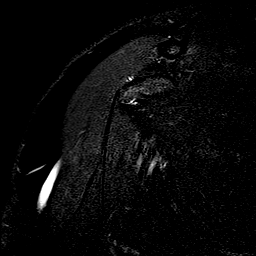
[im 7/19]
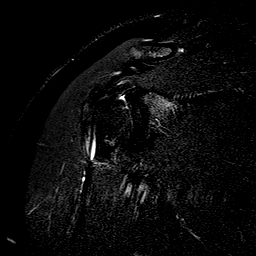
[im 10/19]
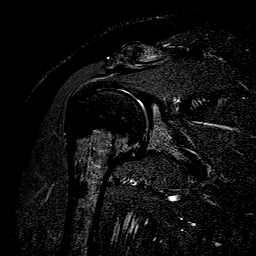
[im 13/19]
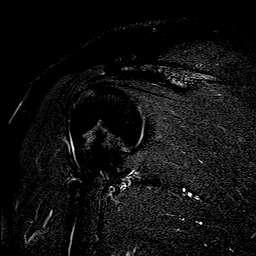
[im 16/19]
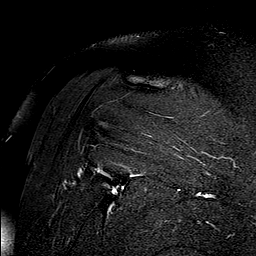
[im 19/19]
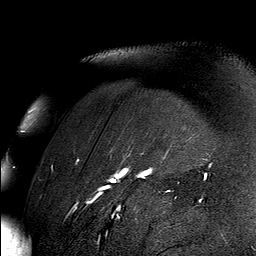

[Series 8: PD fat-sat · oblique · 4.0mm · 0.59mm/px · 7 of 19 slices shown (2 of 2)]
[im 1/19]
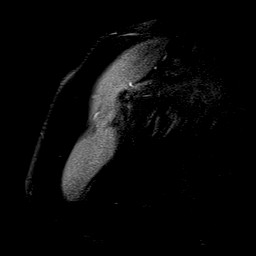
[im 4/19]
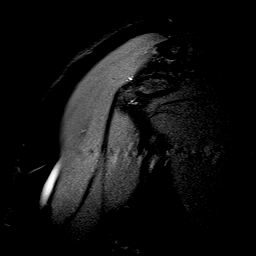
[im 7/19]
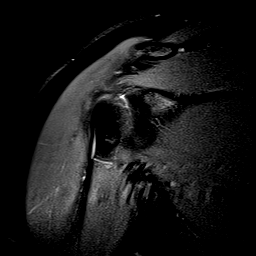
[im 10/19]
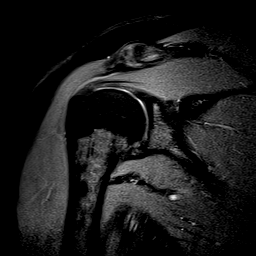
[im 13/19]
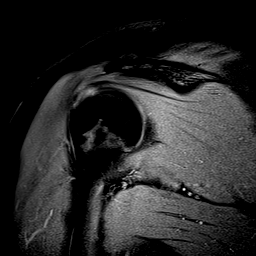
[im 16/19]
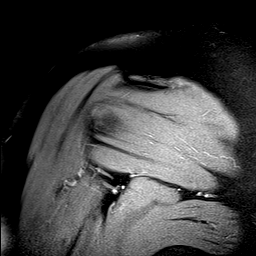
[im 19/19]
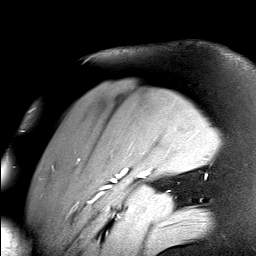

[Series 9: T1 · coronal · 4.0mm · 0.59mm/px · 9 of 24 slices shown]
[im 1/24]
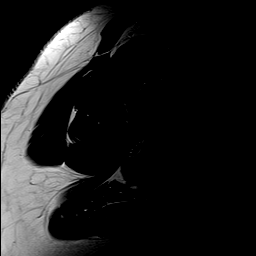
[im 3/24]
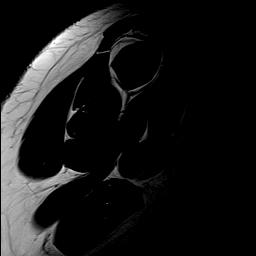
[im 6/24]
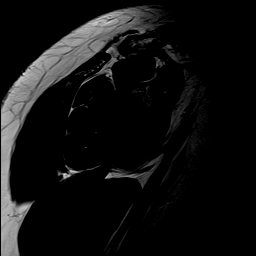
[im 9/24]
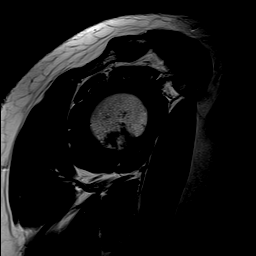
[im 12/24]
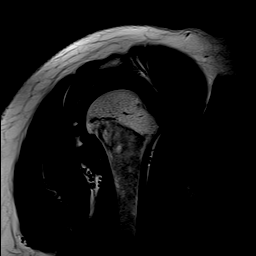
[im 15/24]
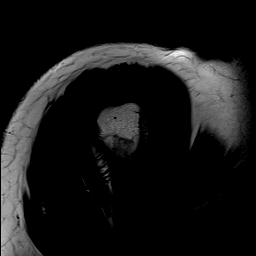
[im 18/24]
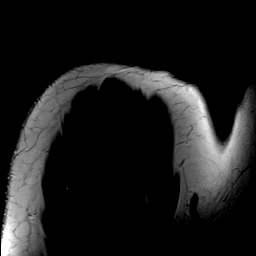
[im 21/24]
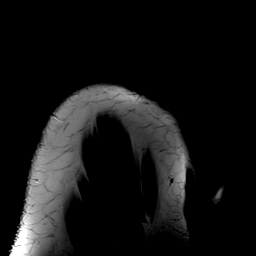
[im 24/24]
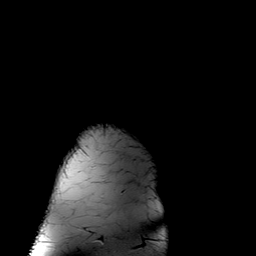

[Series 10: T2 fat-sat · coronal · 4.0mm · 0.59mm/px · 9 of 24 slices shown (2 of 2)]
[im 1/24]
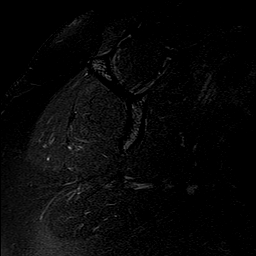
[im 3/24]
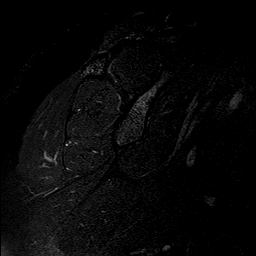
[im 6/24]
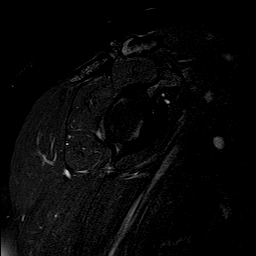
[im 9/24]
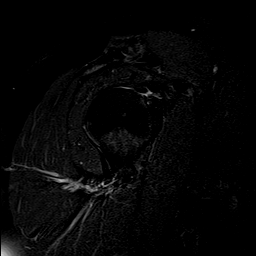
[im 12/24]
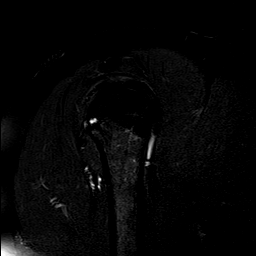
[im 15/24]
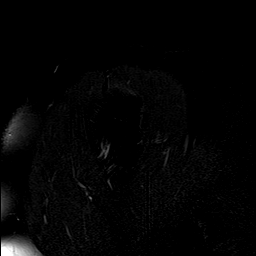
[im 18/24]
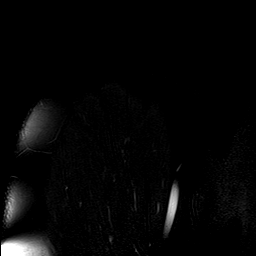
[im 21/24]
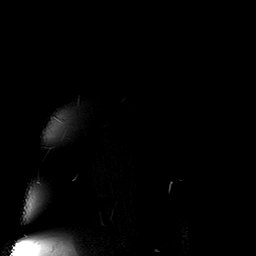
[im 24/24]
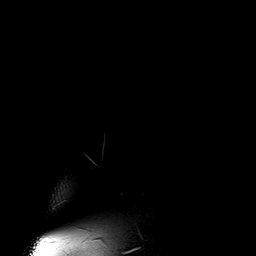

[40 of 40 positions shown; findings below may reference images not displayed]

FINDINGS: Rotator cuff:  Mild infraspinatus tendinopathy on image [DATE].

Muscles:  Unremarkable

Biceps long head:  Unremarkable

Acromioclavicular Joint: Mild degenerative spurring and mild
degenerative subcortical marrow edema. Type II acromion. No
significant bursitis.

Glenohumeral Joint: Unremarkable

Labrum:  Grossly unremarkable.

Bones: Small degenerative subcortical cystic lesion posteriorly
along the humeral head.

Other: No supplemental non-categorized findings.
IMPRESSION: 1. Mild infraspinatus tendinopathy. No rotator cuff tear identified.
2. Mild degenerative spurring and mild degenerative subcortical
marrow edema along the acromioclavicular joint.

## 2020-09-24 IMAGING — US US ABDOMEN COMPLETE
1 series · 14 of 25 positions shown · non-contrast
Comparison: July 07, 2014.

CLINICAL DATA: Gastroesophageal reflux disease. Ulcerative
proctitis.

EXAM:
ABDOMEN ULTRASOUND COMPLETE

[Series 1: us abdomen complete · 14 of 82 slices shown]
[im 1/82]
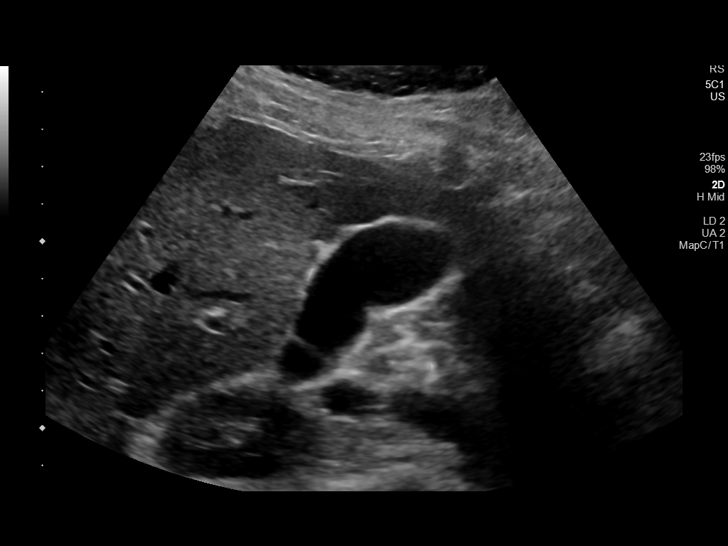
[im 7/82]
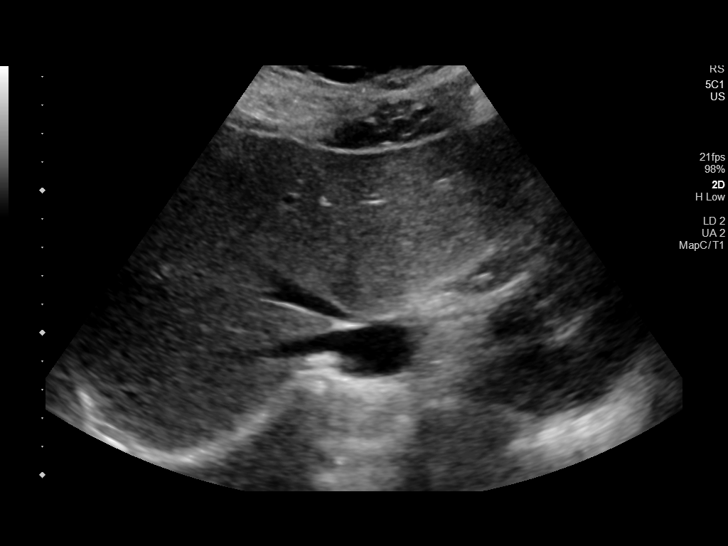
[im 14/82]
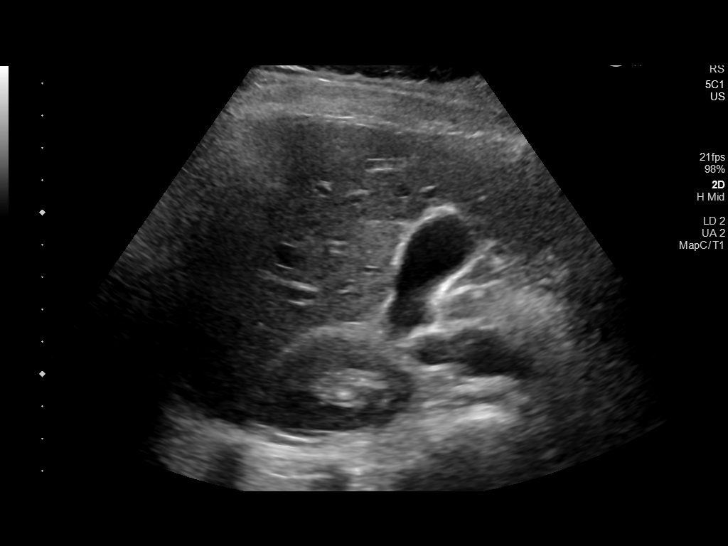
[im 21/82]
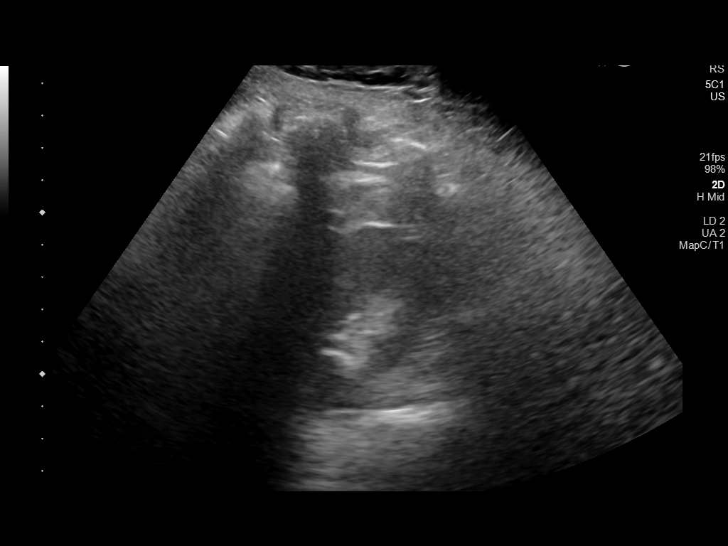
[im 28/82]
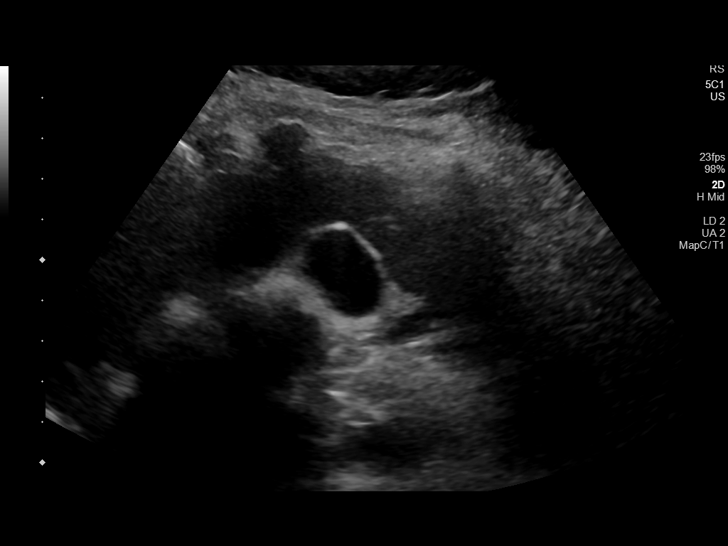
[im 31/82]
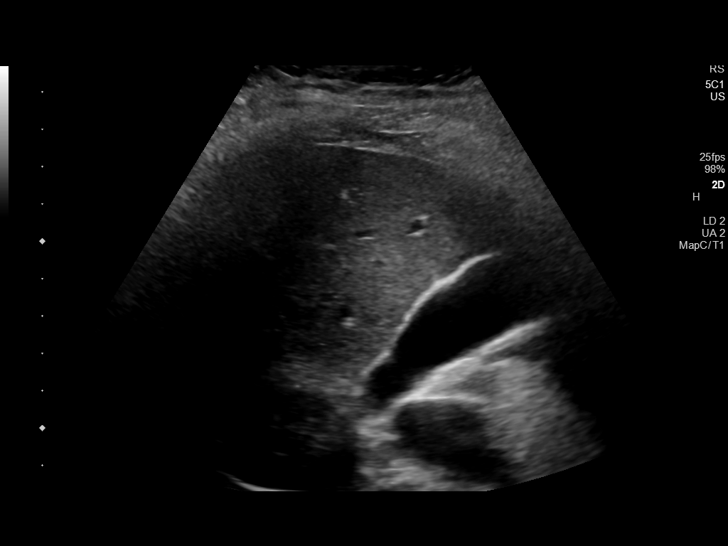
[im 38/82]
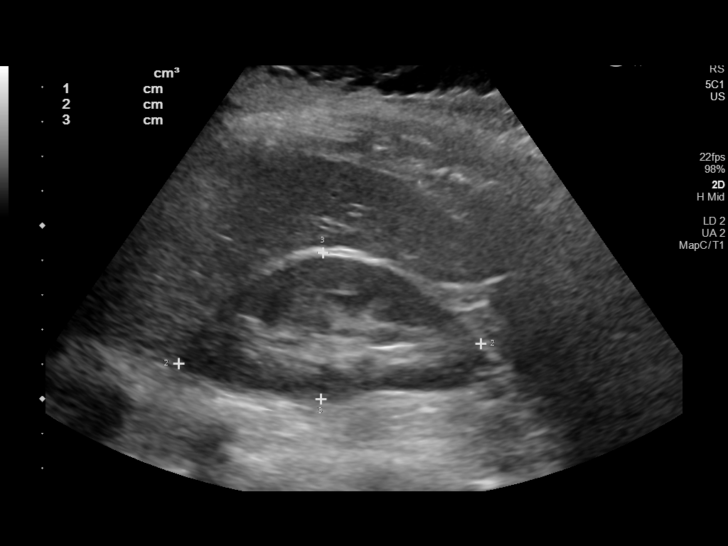
[im 44/82]
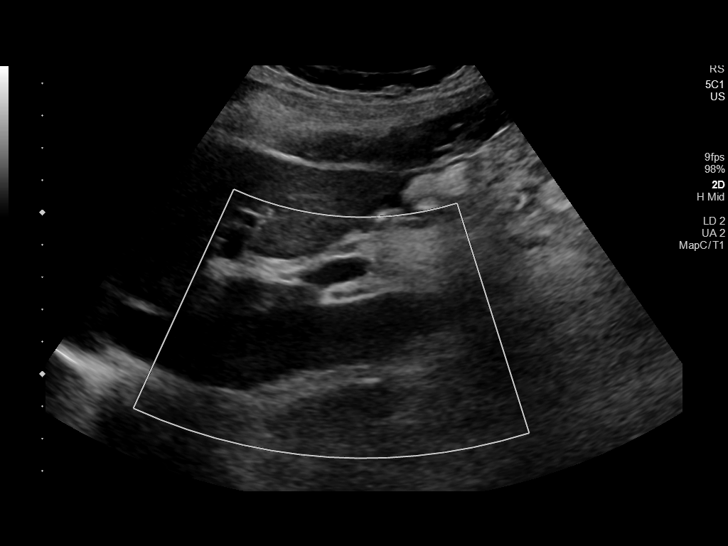
[im 51/82]
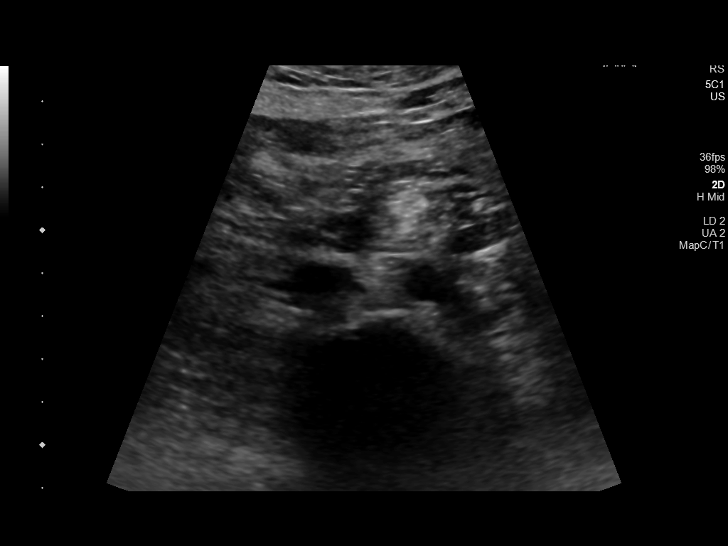
[im 55/82]
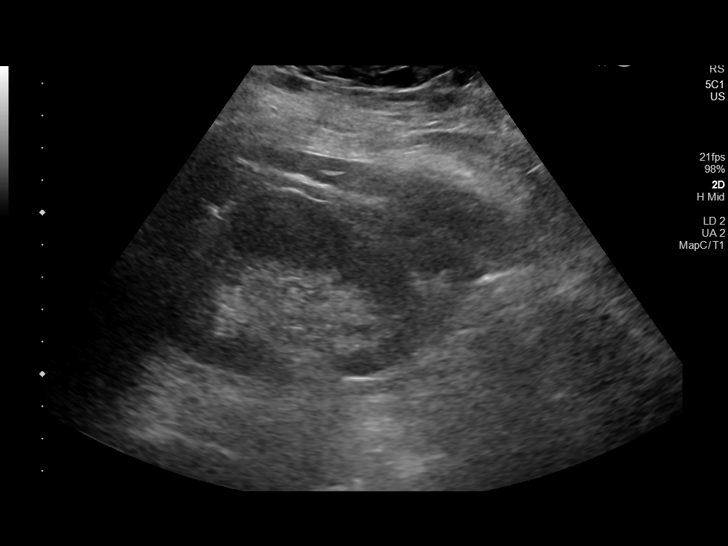
[im 61/82]
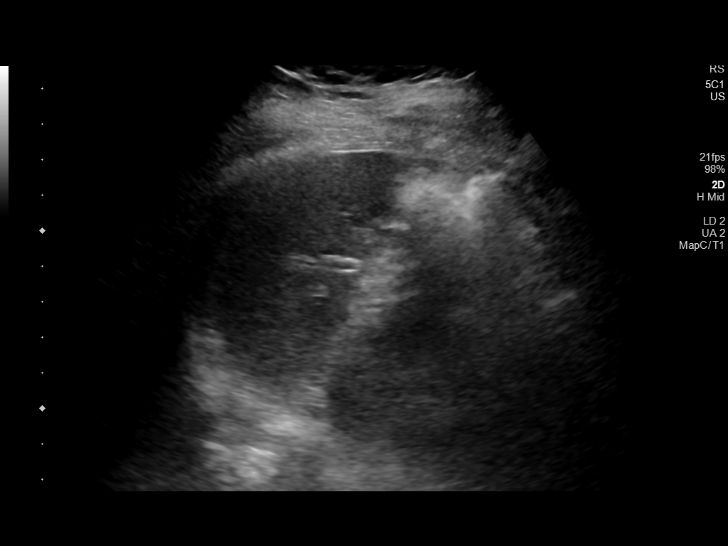
[im 68/82]
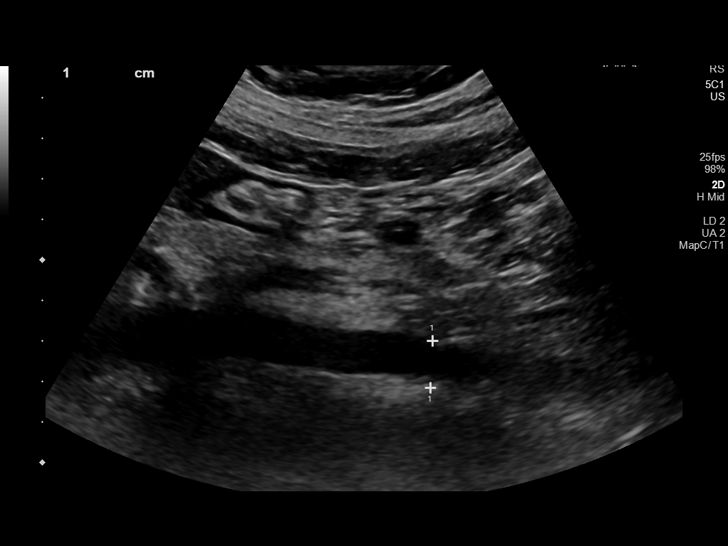
[im 75/82]
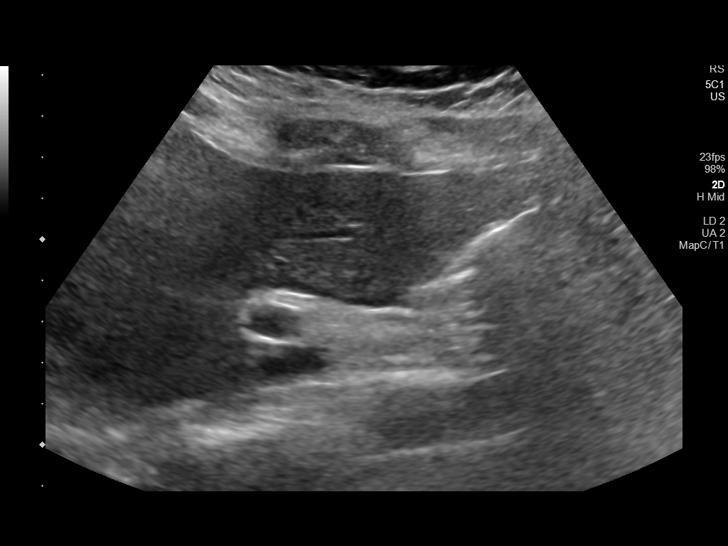
[im 82/82]
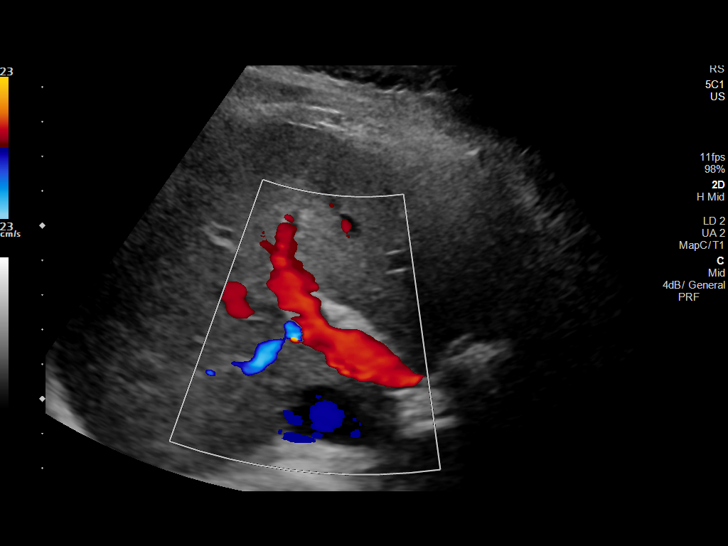

[14 of 25 positions shown; findings below may reference images not displayed]

FINDINGS: Gallbladder: No gallstones or wall thickening visualized. No
sonographic Murphy sign noted by sonographer.

Common bile duct: Diameter: 2 mm which is within normal limits.

Liver: No focal lesion identified. Within normal limits in
parenchymal echogenicity. Portal vein is patent on color Doppler
imaging with normal direction of blood flow towards the liver.

IVC: No abnormality visualized.

Pancreas: Visualized portion unremarkable.

Spleen: Size and appearance within normal limits.

Right Kidney: Length: 8.7 cm. Echogenicity within normal limits. No
mass or hydronephrosis visualized.

Left Kidney: Length: 9.9 cm. Echogenicity within normal limits. No
mass or hydronephrosis visualized.

Abdominal aorta: No aneurysm visualized.

Other findings: None.
IMPRESSION: No abnormality seen in the abdomen.

## 2020-12-22 ENCOUNTER — Other Ambulatory Visit: Payer: Self-pay

## 2020-12-23 ENCOUNTER — Encounter: Payer: Self-pay | Admitting: Adult Health

## 2020-12-23 ENCOUNTER — Ambulatory Visit (INDEPENDENT_AMBULATORY_CARE_PROVIDER_SITE_OTHER): Payer: 59 | Admitting: Adult Health

## 2020-12-23 VITALS — BP 94/60 | HR 89 | Temp 98.6°F | Ht 60.0 in | Wt 188.0 lb

## 2020-12-23 DIAGNOSIS — G5601 Carpal tunnel syndrome, right upper limb: Secondary | ICD-10-CM | POA: Diagnosis not present

## 2020-12-23 DIAGNOSIS — M79671 Pain in right foot: Secondary | ICD-10-CM | POA: Diagnosis not present

## 2020-12-23 MED ORDER — PREDNISONE 10 MG PO TABS: ORAL_TABLET | ORAL | 0 refills | Status: DC

## 2020-12-23 NOTE — Progress Notes (Signed)
Subjective:    Patient ID: Rudell Cobb, female    DOB: May 09, 1991, 30 y.o.   MRN: [id number]  HPI 30 year old female who  has a past medical history of Acne, Active preterm labor (10/23/2016), Headache(784.0), Inflammatory bowel diseases (IBD), Irregular periods, and Pilonidal cyst.  She presents to the office today for acute issues of numbness and tingling in her right hand and wrist as well as pain in her right foot  Right Wrist -he reports that at least over the last 2 weeks she has had numbness and tingling and all of her fingers on her right hand except for her right thumb as well as numbness in her right wrist.  Numbness and tingling was intermittent until about 3 days ago when it started to become more consistent.  It is worse in the early morning when she feels as though her hand is asleep.  No history of carpal tunnel syndrome in the past.  She denies injury or trauma  Right Foot -reports a constant aching on the ball of her right.  Pain with ambulation as well as with rest.  Noticeable with flexion and dorsiflexion of her right foot   Review of Systems See HPI   Past Medical History:  Diagnosis Date   Acne    Active preterm labor 10/23/2016   Headache(784.0)    Inflammatory bowel diseases (IBD)    Irregular periods    Pilonidal cyst     Social History   Socioeconomic History   Marital status: Married    Spouse name: Not on file   Number of children: Not on file   Years of education: Not on file   Highest education level: Not on file  Occupational History   Not on file  Tobacco Use   Smoking status: Never   Smokeless tobacco: Never  Vaping Use   Vaping Use: Never used  Substance and Sexual Activity   Alcohol use: Yes    Alcohol/week: 0.0 standard drinks    Comment: occasionally   Drug use: No   Sexual activity: Yes  Other Topics Concern   Not on file  Social History Narrative   [place]    Was Living at home    [facility] of 4   Played  basketball in HS no CV pulm signs   Childbirth in January 2012   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[employer]   Not working out of home at this time    NOw living iwht in Pharmacist, hospital and 4 years okd husband    Small quarters at home all day with 32 yo    To go to pre k if gets accepted  Neg  ocass etoh   Right Handed   Drinks Caffeine    One Story Home         Social Determinants of Health   Financial Resource Strain: Not on file  Food Insecurity: Not on file  Transportation Needs: Not on file  Physical Activity: Not on file  Stress: Not on file  Social Connections: Not on file  Intimate Partner Violence: Not on file    Past Surgical History:  Procedure Laterality Date   BIRTH CONTROL IMPLANT     CESAREAN SECTION     COLONOSCOPY  03/29/2020   2016    Family History  Problem Relation Age of Onset   Other Mother  irreg periods   Other Sister        irreg periods   Sleep apnea Father        on machine   Breast cancer Maternal Aunt    Brain cancer Paternal Aunt    Colon cancer Neg Hx    Throat cancer Neg Hx    Kidney disease Neg Hx    Liver disease Neg Hx    Diabetes Neg Hx    Heart disease Neg Hx    Stomach cancer Neg Hx     No Known Allergies  Current Outpatient Medications on File Prior to Visit  Medication Sig Dispense Refill   dicyclomine (BENTYL) 20 MG tablet Take 1 tablet (20 mg total) by mouth every 8 (eight) hours as needed for spasms (abdominal cramping/bloating). 60 tablet 1   mesalamine (CANASA) 1000 MG suppository Place 1 suppository (1,000 mg total) rectally at bedtime. 32 suppository 0   Current Facility-Administered Medications on File Prior to Visit  Medication Dose Route Frequency Provider Last Rate Last Admin   0.9 %  sodium chloride infusion  500 mL Intravenous Once Gatha Mayer, MD        BP 94/60   Pulse 89   Temp 98.6 F (37 C) (Oral)   Ht 5' (1.524 m)   Wt 188 lb (85.3 kg)    SpO2 96%   BMI 36.72 kg/m       Objective:   Physical Exam Vitals and nursing note reviewed.  Constitutional:      Appearance: Normal appearance.  Cardiovascular:     Rate and Rhythm: Normal rate and regular rhythm.     Pulses: Normal pulses.     Heart sounds: Normal heart sounds.  Pulmonary:     Effort: Pulmonary effort is normal.     Breath sounds: Normal breath sounds.  Musculoskeletal:        General: No tenderness. Normal range of motion.     Comments: No tenderness to right foot with palpation, palpation actually made pain improved.  Has full range of motion but reports discomfort with flexion and dorsiflexion.  No nodules at the ball of the foot felt.  No pain along plantar fascia.  Neurological:     General: No focal deficit present.     Mental Status: She is alert and oriented to person, place, and time.     Comments: Positive Tinel's and Phalen sign to right hand  Psychiatric:        Mood and Affect: Mood normal.        Behavior: Behavior normal.        Thought Content: Thought content normal.        Judgment: Judgment normal.       Assessment & Plan:   1. Carpal tunnel syndrome of right wrist -Treat with prednisone therapy.  Advise follow-up if no improvement - predniSONE (DELTASONE) 10 MG tablet; Take two tabs x 7 days and then 1 day x 7 days  Dispense: 21 tablet; Refill: 0  2. Right foot pain -Encouraged icing and anti-inflammatory use.  Follow-up if no improvement   Dorothyann Peng, NP

## 2021-01-05 ENCOUNTER — Telehealth: Payer: Self-pay

## 2021-01-05 DIAGNOSIS — G5601 Carpal tunnel syndrome, right upper limb: Secondary | ICD-10-CM

## 2021-01-05 NOTE — Telephone Encounter (Signed)
Patient called stating she still has pain in her wrist and it goes numb occasionally patient would like to know if a different Rx can be sent to the pharmacy or if she needs another visit

## 2021-01-06 NOTE — Addendum Note (Signed)
Addended by: Nilda Riggs on: 01/06/2021 12:41 PM   Modules accepted: Orders

## 2021-01-06 NOTE — Telephone Encounter (Signed)
Referral has been placed and pt is aware.

## 2021-04-27 ENCOUNTER — Telehealth (INDEPENDENT_AMBULATORY_CARE_PROVIDER_SITE_OTHER): Payer: 59 | Admitting: Family Medicine

## 2021-04-27 ENCOUNTER — Encounter: Payer: Self-pay | Admitting: Family Medicine

## 2021-04-27 DIAGNOSIS — R059 Cough, unspecified: Secondary | ICD-10-CM | POA: Diagnosis not present

## 2021-04-27 DIAGNOSIS — R0981 Nasal congestion: Secondary | ICD-10-CM | POA: Diagnosis not present

## 2021-04-27 MED ORDER — BENZONATATE 100 MG PO CAPS: ORAL_CAPSULE | ORAL | 0 refills | Status: DC

## 2021-04-27 NOTE — Progress Notes (Signed)
Virtual Visit via Video Note  I connected with Cheryl Walters  on 04/27/21 at  1:00 PM EST by a video enabled telemedicine application and verified that I am speaking with the correct person using two identifiers.  Location patient: home, [place] Location provider:work or home office Persons participating in the virtual visit: patient, provider  I discussed the limitations of evaluation and management by telemedicine and the availability of in person appointments. The patient expressed understanding and agreed to proceed.   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  Past Medical History:  Diagnosis Date   Acne    Active preterm labor 10/23/2016   Headache(784.0)    Inflammatory bowel diseases (IBD)    Irregular periods    Pilonidal cyst     Past Surgical History:  Procedure Laterality Date   BIRTH CONTROL IMPLANT     CESAREAN SECTION     COLONOSCOPY  03/29/2020   2016     Current Outpatient Medications:    benzonatate (TESSALON PERLES) 100 MG capsule, 1-2 capsules up to twice daily as needed for cough, Disp: 40 capsule, Rfl: 0   levonorgestrel (MIRENA) 20 MCG/DAY IUD, 1 each by Intrauterine route once., Disp: , Rfl:   Current Facility-Administered Medications:    0.9 %  sodium chloride infusion, 500 mL, Intravenous, Once, Gatha Mayer, MD  EXAM:  VITALS per patient if applicable:  GENERAL: alert, oriented,  appears well and in no acute distress  HEENT: atraumatic, conjunttiva clear, no obvious abnormalities on inspection of external nose and ears  NECK: normal movements of the head and neck  LUNGS: on inspection no signs of respiratory distress, breathing rate appears normal, no obvious gross SOB, gasping or wheezing  CV: no obvious cyanosis  MS: moves all visible extremities without noticeable abnormality  PSYCH/NEURO: pleasant and cooperative, no obvious depression or anxiety, speech and thought processing grossly intact  ASSESSMENT AND PLAN:  Discussed the following assessment and plan:  Nasal congestion  Cough, unspecified type  -we discussed possible serious and likely etiologies, options for evaluation and workup, limitations of telemedicine visit vs in person visit, treatment, treatment risks and precautions. Pt is agreeable to treatment via telemedicine at this moment. Query viral resp illness, possibly flu or covid vs other. She opted against tamiflu. Discussed covid testing and she plans to home test. Sent Tessalon rx for cough and symptomatic care measures summarized in patient instructions.  Work/School slipped offered: provided in patient instructions   Advised to seek prompt in person care if worsening, new symptoms arise, or if is not improving with treatment. Discussed options for inperson care if PCP office not available. Did let this patient know that I only do telemedicine on Tuesdays and Thursdays for [place]. Advised to schedule follow up visit with PCP or [facility] if any further questions or concerns to avoid delays in care.   I discussed the assessment and treatment plan with the patient. The patient was provided an opportunity to ask questions and all were answered. The  patient agreed with the plan and demonstrated an understanding of the instructions.     Lucretia Kern, DO

## 2021-04-27 NOTE — Patient Instructions (Addendum)
° ° ° °--------------------------------------------------------------------------------------------------------------------------- ° ° ° °  WORK SLIP:  Patient Cheryl Walters,  [birth year], was seen for a medical visit today, 04/27/21 . Please excuse from work for a COVID/flu like illness. If Covid19 testing is positive advise 10 days minimum from the onset of symptoms (04/26/21) PLUS 1 day of no fever and improved symptoms. Will defer to employer for a sooner return to work if patient has 2 negative covid tests 48 hours apart and is feeling better, or if symptoms have resolved, it is greater than 5 days since the positive test and the patient can wear a high-quality, tight fitting mask such as N95 or KN95 at all times for an additional 5 days.   Sincerely: E-signature: Dr. Colin Benton, DO [place] Ph: [phone]   ------------------------------------------------------------------------------------------------------------------------------    HOME CARE TIPS:  -COVID19 testing information: ForwardDrop.[email or web address]  Most pharmacies also offer testing and home test kits. If the Covid19 test is positive and you desire antiviral treatment, please contact a [facility] or schedule a follow up virtual visit through your primary care office or through the [facility].  Other test to treat options: ConnectRV.is?[email or web address]  -I sent the medication(s) we discussed to your pharmacy: Meds ordered this encounter  Medications   benzonatate (TESSALON PERLES) 100 MG capsule    Sig: 1-2 capsules up to twice daily as needed for cough    Dispense:  40 capsule    Refill:  0     -can use tylenol or aleve if needed for fevers, aches and pains per instructions  -can use nasal saline a few times per day if you have nasal congestion; sometimes  a short course of Afrin nasal spray for 3 days can help with symptoms as  well  -stay hydrated, drink plenty of fluids and eat small healthy meals - avoid dairy  -can take 1000 IU (33mg) Vit D3 and 100-500 mg of Vit C daily per instructions  -If the Covid test is positive, check out the CDC website for more information on home care, transmission and treatment for COVID19  -follow up with your doctor in 2-3 days unless improving and feeling better  -stay home while sick, except to seek medical care. If you have COVID19, you will likely be contagious for 7-10 days. Flu or Influenza is likely contagious for about 7 days. Other respiratory viral infections remain contagious for 5-10+ days depending on the virus and many other factors. Wear a good mask that fits snugly (such as N95 or KN95) if around others to reduce the risk of transmission.  It was nice to meet you today, and I really hope you are feeling better soon. I help [facility] out with telemedicine visits on Tuesdays and Thursdays and am happy to help if you need a follow up virtual visit on those days. Otherwise, if you have any concerns or questions following this visit please schedule a follow up visit with your Primary Care doctor or seek care at a local urgent care clinic to avoid delays in care.    Seek in person care or schedule a follow up video visit promptly if your symptoms worsen, new concerns arise or you are not improving with treatment. Call 911 and/or seek emergency care if your symptoms are severe or life threatening.

## 2021-05-16 ENCOUNTER — Telehealth: Payer: Self-pay | Admitting: Internal Medicine

## 2021-05-16 ENCOUNTER — Encounter: Payer: Self-pay | Admitting: Internal Medicine

## 2021-05-16 DIAGNOSIS — G5601 Carpal tunnel syndrome, right upper limb: Secondary | ICD-10-CM

## 2021-05-16 DIAGNOSIS — M79641 Pain in right hand: Secondary | ICD-10-CM

## 2021-05-16 DIAGNOSIS — S63601A Unspecified sprain of right thumb, initial encounter: Secondary | ICD-10-CM

## 2021-05-16 NOTE — Telephone Encounter (Signed)
Sorry things are not going well. WE can do a referral to [facility]  Dx  hand pain, CTS ,thumb sprain You may need to get records  to go with  you.

## 2021-05-16 NOTE — Telephone Encounter (Signed)
Already answered  Please do referral or make sure it is placed

## 2021-05-16 NOTE — Telephone Encounter (Signed)
Patient called because she is unhappy with her Hand specialist that she is currently seeing. Patient would like a new referral to [facility] on [address], [place]. Patient states that she has seen the specialist twice and her hand has not gotten better.     Good callback number is [phone]    Please advise

## 2021-05-17 NOTE — Telephone Encounter (Signed)
This referral has been placed.

## 2021-06-22 ENCOUNTER — Ambulatory Visit (INDEPENDENT_AMBULATORY_CARE_PROVIDER_SITE_OTHER): Payer: 59 | Admitting: Family Medicine

## 2021-06-22 ENCOUNTER — Encounter: Payer: Self-pay | Admitting: Family Medicine

## 2021-06-22 VITALS — BP 110/68 | HR 79 | Temp 98.2°F | Wt 189.8 lb

## 2021-06-22 DIAGNOSIS — R8290 Unspecified abnormal findings in urine: Secondary | ICD-10-CM

## 2021-06-22 LAB — POCT URINALYSIS DIPSTICK
Bilirubin, UA: POSITIVE
Blood, UA: POSITIVE
Glucose, UA: NEGATIVE
Ketones, UA: NEGATIVE
Nitrite, UA: POSITIVE
Protein, UA: POSITIVE — AB
Spec Grav, UA: 1.015 (ref 1.010–1.025)
Urobilinogen, UA: 0.2 E.U./dL
pH, UA: 7 (ref 5.0–8.0)

## 2021-06-22 NOTE — Progress Notes (Signed)
Subjective:    Patient ID: Cheryl Walters, female    DOB: [birth year], 31 y.o.   MRN: [id number]  Chief Complaint  Patient presents with   Urinary Tract Infection    Cloudy urine, strong odor, no other symptoms    HPI Patient was seen today for ongoing concern.  Patient endorses cloudy malodorous urine x2 weeks.  Patient denies dysuria, suprapubic pain, nausea, vomiting, back pain constipation, vaginal discharge or irritation.  Patient has been trying to increase p.o. intake of water x1 month.  Drinking less soda.  Past Medical History:  Diagnosis Date   Acne    Active preterm labor 10/23/2016   Headache(784.0)    Inflammatory bowel diseases (IBD)    Irregular periods    Pilonidal cyst     No Known Allergies  ROS General: Denies fever, chills, night sweats, changes in weight, changes in appetite HEENT: Denies headaches, ear pain, changes in vision, rhinorrhea, sore throat CV: Denies CP, palpitations, SOB, orthopnea Pulm: Denies SOB, cough, wheezing GI: Denies abdominal pain, nausea, vomiting, diarrhea, constipation GU: Denies dysuria, hematuria, frequency, vaginal discharge  + malodorous, cloudy urine Msk: Denies muscle cramps, joint pains Neuro: Denies weakness, numbness, tingling Skin: Denies rashes, bruising Psych: Denies depression, anxiety, hallucinations      Objective:    Blood pressure 110/68, pulse 79, temperature 98.2 F (36.8 C), temperature source Oral, weight 189 lb 12.8 oz (86.1 kg), SpO2 98 %.   Gen. Pleasant, well-nourished, in no distress, normal affect   HEENT: [place]/AT, face symmetric, conjunctiva clear, no scleral icterus, PERRLA, EOMI, nares patent without drainage Lungs: no accessory muscle use Cardiovascular: RRR, no peripheral edema Musculoskeletal: No deformities, no cyanosis or clubbing, normal tone Neuro:  A&Ox3, CN II-XII intact, normal gait Skin:  Warm, no lesions/ rash   Wt Readings from Last 3 Encounters:  06/22/21 189 lb 12.8 oz  (86.1 kg)  12/23/20 188 lb (85.3 kg)  06/10/20 169 lb 3.2 oz (76.7 kg)    Lab Results  Component Value Date   WBC 8.3 12/18/2019   HGB 12.7 12/18/2019   HCT 40.1 12/18/2019   PLT 351 12/18/2019   GLUCOSE 96 12/18/2019   CHOL 192 12/03/2007   TRIG 37 12/03/2007   HDL 83.2 12/03/2007   LDLCALC 101 (H) 12/03/2007   ALT 10 01/05/2020   AST 13 01/05/2020   NA 137 12/18/2019   K 3.8 12/18/2019   CL 105 12/18/2019   CREATININE 0.95 12/18/2019   BUN 9 12/18/2019   CO2 23 12/18/2019   TSH 2.502 06/11/2014   HGBA1C 5.3 06/11/2014    Assessment/Plan:  Cloudy urine  - Plan: POCT urinalysis dipstick, Culture, Urine, Urinalysis with Reflex Microscopic  Urine malodor  - Plan: Culture, Urine, Urinalysis with Reflex Microscopic  Discussed possible causes of cloudy malodorous urine including UTI foods, medications, dehydration.  POC UA with trace leuks, protein, bilirubin, RBCs, and nitrites.  Will obtain U CX and urinalysis with reflex microscopic.  Discussed increasing p.o. hydration.  Will await culture results and start antibiotic if needed based on results as patient is otherwise asymptomatic.  Given strict precautions  F/u as needed  Grier Mitts, MD

## 2021-06-23 LAB — URINALYSIS, ROUTINE W REFLEX MICROSCOPIC
Bilirubin Urine: NEGATIVE
Ketones, ur: NEGATIVE
Leukocytes,Ua: NEGATIVE
Nitrite: POSITIVE — AB
Specific Gravity, Urine: 1.02 (ref 1.000–1.030)
Urine Glucose: NEGATIVE
Urobilinogen, UA: 0.2 (ref 0.0–1.0)
pH: 7 (ref 5.0–8.0)

## 2021-06-24 LAB — URINE CULTURE
MICRO NUMBER:: 12987288
SPECIMEN QUALITY:: ADEQUATE

## 2021-06-26 ENCOUNTER — Other Ambulatory Visit: Payer: Self-pay | Admitting: Family Medicine

## 2021-06-26 DIAGNOSIS — N3001 Acute cystitis with hematuria: Secondary | ICD-10-CM

## 2021-06-26 MED ORDER — NITROFURANTOIN MONOHYD MACRO 100 MG PO CAPS: 100.0000 mg | ORAL_CAPSULE | Freq: Two times a day (BID) | ORAL | 0 refills | Status: AC

## 2021-07-11 ENCOUNTER — Other Ambulatory Visit (INDEPENDENT_AMBULATORY_CARE_PROVIDER_SITE_OTHER): Payer: 59

## 2021-07-11 ENCOUNTER — Encounter: Payer: Self-pay | Admitting: Gastroenterology

## 2021-07-11 ENCOUNTER — Ambulatory Visit (INDEPENDENT_AMBULATORY_CARE_PROVIDER_SITE_OTHER): Payer: 59 | Admitting: Gastroenterology

## 2021-07-11 VITALS — BP 100/64 | HR 78 | Ht 60.0 in | Wt 191.4 lb

## 2021-07-11 DIAGNOSIS — K51211 Ulcerative (chronic) proctitis with rectal bleeding: Secondary | ICD-10-CM

## 2021-07-11 LAB — COMPREHENSIVE METABOLIC PANEL
ALT: 16 U/L (ref 0–35)
AST: 16 U/L (ref 0–37)
Albumin: 3.9 g/dL (ref 3.5–5.2)
Alkaline Phosphatase: 57 U/L (ref 39–117)
BUN: 9 mg/dL (ref 6–23)
CO2: 29 mEq/L (ref 19–32)
Calcium: 9.1 mg/dL (ref 8.4–10.5)
Chloride: 104 mEq/L (ref 96–112)
Creatinine, Ser: 0.87 mg/dL (ref 0.40–1.20)
GFR: 88.06 mL/min (ref >60.00)
Glucose, Bld: 106 mg/dL — ABNORMAL HIGH (ref 70–99)
Potassium: 4.1 mEq/L (ref 3.5–5.1)
Sodium: 139 mEq/L (ref 135–145)
Total Bilirubin: 0.4 mg/dL (ref 0.2–1.2)
Total Protein: 7.5 g/dL (ref 6.0–8.3)

## 2021-07-11 LAB — CBC WITH DIFFERENTIAL/PLATELET
Basophils Absolute: 0 10*3/uL (ref 0.0–0.1)
Basophils Relative: 0.6 % (ref 0.0–3.0)
Eosinophils Absolute: 0.2 10*3/uL (ref 0.0–0.7)
Eosinophils Relative: 3.4 % (ref 0.0–5.0)
HCT: 39.4 % (ref 36.0–46.0)
Hemoglobin: 12.6 g/dL (ref 12.0–15.0)
Lymphocytes Relative: 39.4 % (ref 12.0–46.0)
Lymphs Abs: 2.6 10*3/uL (ref 0.7–4.0)
MCHC: 32 g/dL (ref 30.0–36.0)
MCV: 81.4 fl (ref 78.0–100.0)
Monocytes Absolute: 0.5 10*3/uL (ref 0.1–1.0)
Monocytes Relative: 7.9 % (ref 3.0–12.0)
Neutro Abs: 3.2 10*3/uL (ref 1.4–7.7)
Neutrophils Relative %: 48.7 % (ref 43.0–77.0)
Platelets: 345 10*3/uL (ref 150.0–400.0)
RBC: 4.85 Mil/uL (ref 3.87–5.11)
RDW: 13.8 % (ref 11.5–15.5)
WBC: 6.5 10*3/uL (ref 4.0–10.5)

## 2021-07-11 LAB — HIGH SENSITIVITY CRP: CRP, High Sensitivity: 4.19 mg/L (ref 0.000–5.000)

## 2021-07-11 LAB — IBC + FERRITIN
Ferritin: 54.3 ng/mL (ref 10.0–291.0)
Iron: 105 ug/dL (ref 42–145)
Saturation Ratios: 32.2 % (ref 20.0–50.0)
TIBC: 326.2 ug/dL (ref 250.0–450.0)
Transferrin: 233 mg/dL (ref 212.0–360.0)

## 2021-07-11 LAB — B12 AND FOLATE PANEL
Folate: 12.1 ng/mL (ref >5.9)
Vitamin B-12: 353 pg/mL (ref 211–911)

## 2021-07-11 MED ORDER — MESALAMINE 1000 MG RE SUPP: 1000.0000 mg | Freq: Every day | RECTAL | 1 refills | Status: DC

## 2021-07-11 MED ORDER — MESALAMINE 1.2 G PO TBEC: 2.4000 g | DELAYED_RELEASE_TABLET | Freq: Every day | ORAL | 2 refills | Status: DC

## 2021-07-11 NOTE — Patient Instructions (Signed)
Your provider has requested that you go to the basement level for lab work before leaving today. Press "B" on the elevator. The lab is located at the first door on the left as you exit the elevator.   We have sent the following medications to your pharmacy for you to pick up at your convenience:  Lialda and Canasa   Increase water intake to 8-10 cups per day  Use Benefiber 1 tablespoon twice a day  Due to recent changes in healthcare laws, you may see the results of your imaging and laboratory studies on MyChart before your provider has had a chance to review them.  We understand that in some cases there may be results that are confusing or concerning to you. Not all laboratory results come back in the same time frame and the provider may be waiting for multiple results in order to interpret others.  Please give [place] 48 hours in order for your provider to thoroughly review all the results before contacting the office for clarification of your results.    If you are age 65 or older, your body mass index should be between 23-30. Your Body mass index is 37.38 kg/m. If this is out of the aforementioned range listed, please consider follow up with your Primary Care Provider.  If you are age 67 or younger, your body mass index should be between 19-25. Your Body mass index is 37.38 kg/m. If this is out of the aformentioned range listed, please consider follow up with your Primary Care Provider.   ________________________________________________________  The [facility] providers would like to encourage you to use [facility] to communicate with providers for non-urgent requests or questions.  Due to long hold times on the telephone, sending your provider a message by [facility] may be a faster and more efficient way to get a response.  Please allow 48 business hours for a response.  Please remember that this is for non-urgent requests.  _______________________________________________________   I appreciate the   opportunity to care for you  Thank You   Harl Bowie , MD

## 2021-07-11 NOTE — Progress Notes (Signed)
Cheryl Walters    [id number]    Aug 04, 1990  Primary Care Physician:Panosh, Standley Brooking, MD  Referring Physician: Burnis Medin, MD [place],   [id number]   Chief complaint: Rectal bleeding  HPI:  31 year old very pleasant female here for follow-up visit for ulcerative proctitis   She is having intermittent bright red blood per rectum, she is having on average 1-2 formed bowel movements daily.  Abdominal ultrasound 01/11/20: Normal   She was initially diagnosed with proctitis in 2016 at colonoscopy per Dr. Olevia Perches with finding of proctitis from 0 to 5 cm diffusely.  Path showed chronic active inflammation with crypt distortion but no granulomas, and felt consistent with IBD.    Colonoscopy March 29, 2020 - The perianal and digital rectal examinations were normal. - Normal mucosa was found in the sigmoid colon, in the descending colon, in the transverse colon, in the ascending colon, in the cecum and at the ileocecal valve. Biopsies were taken with a cold forceps for histology. - The terminal ileum appeared normal. - A continuous area of ulcerated mucosa with stigmata of recent bleeding was present in the rectum. Biopsies were taken with a cold forceps for histology. - Non-bleeding internal hemorrhoids were found during retroflexion. The hemorrhoids were medium-sized. 1. Surgical [P], random colon sites - BENIGN COLONIC MUCOSA. - NO ACTIVE INFLAMMATION. - NO DYSPLASIA OR MALIGNANCY. 2. Surgical [P], colon, rectum - CHRONIC MILDLY ACTIVE COLITIS. - NO DYSPLASIA OR MALIGNANCY  Outpatient Encounter Medications as of 07/11/2021  Medication Sig   levonorgestrel (MIRENA) 20 MCG/DAY IUD 1 each by Intrauterine route once.   [DISCONTINUED] benzonatate (TESSALON PERLES) 100 MG capsule 1-2 capsules up to twice daily as needed for cough (Patient not taking: Reported on 07/11/2021)   Facility-Administered Encounter Medications as of 07/11/2021   Medication   0.9 %  sodium chloride infusion    Allergies as of 07/11/2021   (No Known Allergies)    Past Medical History:  Diagnosis Date   Acne    Active preterm labor 10/23/2016   Headache(784.0)    Inflammatory bowel diseases (IBD)    Irregular periods    Pilonidal cyst     Past Surgical History:  Procedure Laterality Date   BIRTH CONTROL IMPLANT     CESAREAN SECTION     COLONOSCOPY  03/29/2020   2016    Family History  Problem Relation Age of Onset   Other Mother        irreg periods   Sleep apnea Father        on machine   Other Sister        irreg periods   Breast cancer Maternal Aunt    Brain cancer Paternal Aunt    Colon cancer Neg Hx    Throat cancer Neg Hx    Kidney disease Neg Hx    Liver disease Neg Hx    Diabetes Neg Hx    Heart disease Neg Hx    Stomach cancer Neg Hx    Pancreatic cancer Neg Hx     Social History   Socioeconomic History   Marital status: Married    Spouse name: Not on file   Number of children: Not on file   Years of education: Not on file   Highest education level: Not on file  Occupational History   Not on file  Tobacco Use   Smoking status: Never   Smokeless tobacco: Never  Vaping Use   Vaping Use: Never used  Substance and Sexual Activity   Alcohol use: Yes    Alcohol/week: 0.0 standard drinks    Comment: occasionally   Drug use: No   Sexual activity: Yes  Other Topics Concern   Not on file  Social History Narrative   [place]    Was Living at home    [facility] of 4   Played basketball in HS no CV pulm signs   Childbirth in January 2012   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[employer]   Not working out of home at this time    NOw living iwht in [facility], hospital and 4 years okd husband    Small quarters at home all day with 33 yo    To go to pre k if gets accepted  Neg  ocass etoh   Right Handed   Drinks Caffeine    [employer] Home         Social  Determinants of Health   Financial Resource Strain: Not on file  Food Insecurity: Not on file  Transportation Needs: Not on file  Physical Activity: Not on file  Stress: Not on file  Social Connections: Not on file  Intimate Partner Violence: Not on file      Review of systems: All other review of systems negative except as mentioned in the HPI.   Physical Exam: Vitals:   07/11/21 0929  BP: 100/64  Pulse: 78   Body mass index is 37.38 kg/m. Gen:      No acute distress HEENT:  sclera anicteric Abd:      soft, non-tender; no palpable masses, no distension Ext:    No edema Neuro: alert and oriented x 3 Psych: normal mood and affect  Data Reviewed:  Reviewed labs, radiology imaging, old records and pertinent past GI work up   Assessment and Plan/Recommendations:  31 year old very pleasant female here for follow-up visit for ulcerative proctitis with complaints of intermittent bright red blood per rectum   Small volume intermittent rectal bleeding, concerning for persistent proctitis, Use Canasa suppository daily at bedtime for 2 to 3 weeks Continue mesalamine 2.4 g daily Follow-up CBC, CMP, CRP, iron panel, B12 and folate If continues to have persistent symptoms, will need to consider starting steroids for bridging and chronic immunosuppressive therapy /Biologics  Return in 4 to 6 weeks   The patient was provided an opportunity to ask questions and all were answered. The patient agreed with the plan and demonstrated an understanding of the instructions.  Damaris Hippo , MD    CC: Panosh, Standley Brooking, MD

## 2021-07-24 ENCOUNTER — Encounter: Payer: Self-pay | Admitting: Gastroenterology

## 2021-07-27 ENCOUNTER — Telehealth: Payer: Self-pay | Admitting: Gastroenterology

## 2021-07-27 NOTE — Telephone Encounter (Signed)
Dr Silverio Decamp, The Canasa suppositories for her is $300. She wants something else in place of Canasa   ?

## 2021-07-27 NOTE — Telephone Encounter (Signed)
Inbound call from patient stating suppositories are over $300 and is requesting something more affordable.  Please advise.  ?

## 2021-07-28 MED ORDER — HYDROCORTISONE ACETATE 25 MG RE SUPP: 25.0000 mg | Freq: Every day | RECTAL | 1 refills | Status: DC

## 2021-07-28 MED ORDER — MESALAMINE 1.2 G PO TBEC: 2.4000 g | DELAYED_RELEASE_TABLET | Freq: Every day | ORAL | 2 refills | Status: DC

## 2021-07-28 NOTE — Telephone Encounter (Signed)
Called patient to inform we are sending in Anusol [facility] for her ?

## 2021-07-28 NOTE — Telephone Encounter (Signed)
Please check if Anusol suppositories are covered if not she will have to use OTC Preparation H suppository with Anusol cream at bedtime for 2 weeks.  Thank you ?

## 2021-09-13 ENCOUNTER — Encounter: Payer: Self-pay | Admitting: Gastroenterology

## 2021-09-13 ENCOUNTER — Ambulatory Visit (INDEPENDENT_AMBULATORY_CARE_PROVIDER_SITE_OTHER): Payer: 59 | Admitting: Gastroenterology

## 2021-09-13 VITALS — BP 90/56 | HR 87 | Ht 60.0 in | Wt 193.0 lb

## 2021-09-13 DIAGNOSIS — K51211 Ulcerative (chronic) proctitis with rectal bleeding: Secondary | ICD-10-CM

## 2021-09-13 MED ORDER — MESALAMINE 1.2 G PO TBEC: 4.8000 g | DELAYED_RELEASE_TABLET | Freq: Every day | ORAL | 3 refills | Status: DC

## 2021-09-13 NOTE — Patient Instructions (Addendum)
If you are age 33 or older, your body mass index should be between 23-30. Your Body mass index is 37.69 kg/m?Marland Kitchen If this is out of the aforementioned range listed, please consider follow up with your Primary Care Provider. ? ?If you are age 29 or younger, your body mass index should be between 19-25. Your Body mass index is 37.69 kg/m?Marland Kitchen If this is out of the aformentioned range listed, please consider follow up with your Primary Care Provider.  ? ?________________________________________________________ ? ?The [facility] providers would like to encourage you to use [facility] to communicate with providers for non-urgent requests or questions.  Due to long hold times on the telephone, sending your provider a message by [facility] may be a faster and more efficient way to get a response.  Please allow 48 business hours for a response.  Please remember that this is for non-urgent requests.  ?_______________________________________________________ ? ?Increase Mesalamine to 4.8 g (4 tablets) daily. Prescription sent to Mail order pharmacy due to significant cost savings. ? ?Take a daily prenatal Vitamin. ? ?Please follow up on Tuesday, 7-25 at 11:00am. ? ?I appreciate the opportunity to care for you. Thank you for choosing me and [facility], ? ?Dr. Harl Bowie  ?

## 2021-09-13 NOTE — Progress Notes (Signed)
?       ? ?       ? ?Cheryl Walters    [id number]    July 09, 1990 ? ?Primary Care Physician:Panosh, Standley Brooking, MD ? ?Referring Physician: Burnis Medin, MD ?[address],  [place] [address] ? ? ?Chief complaint: Ulcerative proctitis ? ?HPI: ? 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  ?Overall she is doing well.  On average she has 2-3 bowel movements per day.  She is noticing decreased rectal bleeding but continues to have occasional small-volume bright red blood after bowel movement. ?  ?Abdominal ultrasound 01/11/20: Normal ?  ?She was initially diagnosed with proctitis in 2016 at colonoscopy per Dr. Olevia Perches with finding of proctitis from 0 to 5 cm diffusely.  Path showed chronic active inflammation with crypt distortion but no granulomas, and felt consistent with IBD. ?  ?  ?Colonoscopy March 29, 2020 ?- The perianal and digital rectal examinations were normal. ?- Normal mucosa was found in the sigmoid colon, in the descending colon, in the transverse ?colon, in the ascending colon, in the cecum and at the ileocecal valve. Biopsies were taken ?with a cold forceps for histology. ?- The terminal ileum appeared normal. ?- A continuous area of ulcerated mucosa with stigmata of recent bleeding was present in the ?rectum. Biopsies were taken with a cold forceps for histology. ?- Non-bleeding internal hemorrhoids were found during retroflexion. The hemorrhoids were ?medium-sized. ?1. Surgical [P], random colon sites ?- BENIGN COLONIC MUCOSA. ?- NO ACTIVE INFLAMMATION. ?- NO DYSPLASIA OR MALIGNANCY. ?2. Surgical [P], colon, rectum ?- CHRONIC MILDLY ACTIVE COLITIS. ?- NO DYSPLASIA OR MALIGNANCY ?  ? ? ?Outpatient Encounter Medications as of 09/13/2021  ?Medication Sig  ? levonorgestrel (MIRENA) 20 MCG/DAY IUD 1 each by Intrauterine route once.  ? mesalamine (LIALDA) 1.2 g EC tablet Take 2 tablets (2.4 g total) by mouth daily with breakfast.  ? hydrocortisone  (ANUSOL-HC) 25 MG suppository Place 1 suppository (25 mg total) rectally at bedtime. (Patient not taking: Reported on 09/13/2021)  ? [DISCONTINUED] 0.9 %  sodium chloride infusion   ? ?No facility-administered encounter medications on file as of 09/13/2021.  ? ? ?Allergies as of 09/13/2021  ? (No Known Allergies)  ? ? ?Past Medical History:  ?Diagnosis Date  ? Acne   ? Active preterm labor 10/23/2016  ? Carpal tunnel syndrome of right wrist   ? Headache(784.0)   ? Inflammatory bowel diseases (IBD)   ? Irregular periods   ? Pilonidal cyst   ? ? ?Past Surgical History:  ?Procedure Laterality Date  ? BIRTH CONTROL IMPLANT    ? CESAREAN SECTION    ? COLONOSCOPY  03/29/2020  ? 2016  ? ? ?Family History  ?Problem Relation Age of Onset  ? Other Mother   ?     irreg periods  ? Sleep apnea Father   ?     on machine  ? Other Sister   ?     irreg periods  ? Breast cancer Maternal Aunt   ? Brain cancer Paternal Aunt   ? Colon cancer Neg Hx   ? Throat cancer Neg Hx   ? Kidney disease Neg Hx   ? Liver disease Neg Hx   ? Diabetes Neg Hx   ? Heart disease Neg Hx   ? Stomach cancer Neg Hx   ? Pancreatic cancer Neg Hx   ? ? ?Social History  ? ?Socioeconomic History  ? Marital status:  Married  ?  Spouse name: Not on file  ? Number of children: Not on file  ? Years of education: Not on file  ? Highest education level: Not on file  ?Occupational History  ? Not on file  ?Tobacco Use  ? Smoking status: Never  ? Smokeless tobacco: Never  ?Vaping Use  ? Vaping Use: Never used  ?Substance and Sexual Activity  ? Alcohol use: Yes  ?  Alcohol/week: 0.0 standard drinks  ?  Comment: occasionally  ? Drug use: No  ? Sexual activity: Yes  ?Other Topics Concern  ? Not on file  ?Social History Narrative  ? [place]   ? Was Living at home   ? [facility] of 4  ? Played basketball in HS no CV pulm signs  ? Childbirth in January 2012  ? 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   Night shift   ?  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 Not working out  of home at this time   ? NOw living iwht in laws and 4 years okd husband   ? Small quarters at home all day with 33 yo   ? To go to pre k if gets accepted  Neg  ocass etoh  ? Right Handed  ? Drinks Caffeine   ? [employer] Home  ?   ?   ? ?Social Determinants of Health  ? ?Financial Resource Strain: Not on file  ?Food Insecurity: Not on file  ?Transportation Needs: Not on file  ?Physical Activity: Not on file  ?Stress: Not on file  ?Social Connections: Not on file  ?Intimate Partner Violence: Not on file  ? ? ? ? ?Review of systems: ?All other review of systems negative except as mentioned in the HPI. ? ? ?Physical Exam: ?Vitals:  ? 09/13/21 1359  ?BP: (!) 90/56  ?Pulse: 87  ?SpO2: 97%  ? ?Body mass index is 37.69 kg/m?. ?Gen:      No acute distress ?HEENT:  sclera anicteric ?Abd:      soft, non-tender; no palpable masses, no distension ?Ext:    No edema ?Neuro: alert and oriented x 3 ?Psych: normal mood and affect ? ?Data Reviewed: ? ?Reviewed labs, radiology imaging, old records and pertinent past GI work up ? ? ?Assessment and Plan/Recommendations: ? 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  ?Small volume intermittent rectal bleeding, concerning for persistent proctitis, ?Increase mesalamine 4.8 g daily ?Advised patient to start taking prenatal multivitamin given she has borderline low B12, folate and ferritin ? ?Return in 3 months ? ? The patient was provided an opportunity to ask questions and all were answered. The patient agreed with the plan and demonstrated an understanding of the instructions. ? ?K. Denzil Magnuson , MD ?  ? ?CC: Panosh, Standley Brooking, MD ? ? ?

## 2021-09-14 ENCOUNTER — Ambulatory Visit (INDEPENDENT_AMBULATORY_CARE_PROVIDER_SITE_OTHER): Payer: 59 | Admitting: Family Medicine

## 2021-09-14 ENCOUNTER — Encounter: Payer: Self-pay | Admitting: Family Medicine

## 2021-09-14 VITALS — BP 112/67 | HR 62 | Temp 98.5°F | Wt 193.8 lb

## 2021-09-14 DIAGNOSIS — R319 Hematuria, unspecified: Secondary | ICD-10-CM | POA: Diagnosis not present

## 2021-09-14 DIAGNOSIS — M545 Low back pain, unspecified: Secondary | ICD-10-CM

## 2021-09-14 LAB — POCT URINALYSIS DIPSTICK
Bilirubin, UA: NEGATIVE
Glucose, UA: NEGATIVE
Ketones, UA: NEGATIVE
Leukocytes, UA: NEGATIVE
Nitrite, UA: NEGATIVE
Protein, UA: NEGATIVE
Spec Grav, UA: 1.025 (ref 1.010–1.025)
Urobilinogen, UA: NEGATIVE E.U./dL — AB
pH, UA: 6 (ref 5.0–8.0)

## 2021-09-14 MED ORDER — CYCLOBENZAPRINE HCL 5 MG PO TABS: 5.0000 mg | ORAL_TABLET | Freq: Every evening | ORAL | 0 refills | Status: DC | PRN

## 2021-09-14 NOTE — Progress Notes (Signed)
Subjective:  ? ? Patient ID: Cheryl Walters, female    DOB: [birth year], 31 y.o.   MRN: [id number] ? ?Chief Complaint  ?Patient presents with  ? Back Pain  ?  Low back pain on rt side, about 5 days. Started all of a sudden. No pain or burning when urinates. Took tylenol once, did not help. Pain level 8, sharp pain.   ? ? ?HPI ?Patient was seen today for acute concern.  Pt endorses right-sided low back pain times several days.  Pt does not recall injury.  Denies heavy lifting, pushing pulling.  Pain is sharp, 8/10, and does not move.  Tosses and turns at night.  Pt denies LE weakness, numbness, tingling, rash, dysuria, nausea, vomiting.  Pt tried Tylenol which did not help.  Pt notes discomfort with getting up from a seated position and with certain movements. ? ?Past Medical History:  ?Diagnosis Date  ? Acne   ? Active preterm labor 10/23/2016  ? Carpal tunnel syndrome of right wrist   ? Headache(784.0)   ? Inflammatory bowel diseases (IBD)   ? Irregular periods   ? Pilonidal cyst   ? ? ?No Known Allergies ? ?ROS ?General: Denies fever, chills, night sweats, changes in weight, changes in appetite ?HEENT: Denies headaches, ear pain, changes in vision, rhinorrhea, sore throat ?CV: Denies CP, palpitations, SOB, orthopnea ?Pulm: Denies SOB, cough, wheezing ?GI: Denies abdominal pain, nausea, vomiting, diarrhea, constipation ?GU: Denies dysuria, hematuria, frequency, vaginal discharge ?Msk: Denies muscle cramps, joint pains  +R sided low back pain ?Neuro: Denies weakness, numbness, tingling ?Skin: Denies rashes, bruising ?Psych: Denies depression, anxiety, hallucinations ? ?   ?Objective:  ?  ?Blood pressure 112/67, pulse 62, temperature 98.5 ?F (36.9 ?C), temperature source Oral, weight 193 lb 12.8 oz (87.9 kg), SpO2 99 %. ? ?Gen. Pleasant, well-nourished, sitting straight up in chair, in no distress, normal affect   ?HEENT: [place]/AT, face symmetric, conjunctiva clear, no scleral icterus, PERRLA, EOMI, nares patent  without drainage ?Lungs: no accessory muscle use ?Cardiovascular: RRR, no peripheral edema ?Musculoskeletal: No TTP of cervical, thoracic, lumbar, or paraspinal muscles.  Discomfort with flexion and extension of spine.  No LE weakness.  Patient feels comfortable with getting up from seated position.  No deformities, no cyanosis or clubbing, normal tone ?Neuro:  A&Ox3, CN II-XII intact, normal gait ?Skin:  Warm, no lesions/ rash ? ? ?Wt Readings from Last 3 Encounters:  ?09/14/21 193 lb 12.8 oz (87.9 kg)  ?09/13/21 193 lb (87.5 kg)  ?07/11/21 191 lb 6 oz (86.8 kg)  ? ? ?Lab Results  ?Component Value Date  ? WBC 6.5 07/11/2021  ? HGB 12.6 07/11/2021  ? HCT 39.4 07/11/2021  ? PLT 345.0 07/11/2021  ? GLUCOSE 106 (H) 07/11/2021  ? CHOL 192 12/03/2007  ? TRIG 37 12/03/2007  ? HDL 83.2 12/03/2007  ? LDLCALC 101 (H) 12/03/2007  ? ALT 16 07/11/2021  ? AST 16 07/11/2021  ? NA 139 07/11/2021  ? K 4.1 07/11/2021  ? CL 104 07/11/2021  ? CREATININE 0.87 07/11/2021  ? BUN 9 07/11/2021  ? CO2 29 07/11/2021  ? TSH 2.502 06/11/2014  ? HGBA1C 5.3 06/11/2014  ? ? ?Assessment/Plan: ? ?Acute right-sided low back pain without sciatica  ?-Likely 2/2 muscle strain from tossing and turning at night.  Also consider UTI, radicular pain due to bone spurs or other foraminal narrowing. ?-Discussed expectant management including heat, ice, stretching, massage, topical analgesics, NSAIDs or Tylenol as needed ?-Start Flexeril nightly as needed ?-  Given stretching exercises ?-UA with SG 1.020 and 1+ RBCs. ?- Plan: cyclobenzaprine (FLEXERIL) 5 MG tablet, POCT urinalysis dipstick ? ?Hematuria, unspecified type  ?- Plan: Culture, Urine ? ?F/u prn ? ?Grier Mitts, MD ?

## 2021-09-14 NOTE — Addendum Note (Signed)
Addended by: Anderson Malta on: 09/14/2021 02:14 PM ? ? Modules accepted: Orders ? ?

## 2021-09-15 LAB — URINE CULTURE
MICRO NUMBER:: 13352866
SPECIMEN QUALITY:: ADEQUATE

## 2021-09-22 ENCOUNTER — Encounter: Payer: Self-pay | Admitting: Gastroenterology

## 2021-09-28 ENCOUNTER — Encounter: Payer: Self-pay | Admitting: Adult Health

## 2021-09-28 ENCOUNTER — Ambulatory Visit (INDEPENDENT_AMBULATORY_CARE_PROVIDER_SITE_OTHER): Payer: 59 | Admitting: Adult Health

## 2021-09-28 VITALS — BP 102/60 | HR 85 | Temp 98.5°F | Ht 60.0 in | Wt 191.0 lb

## 2021-09-28 DIAGNOSIS — L02412 Cutaneous abscess of left axilla: Secondary | ICD-10-CM

## 2021-09-28 MED ORDER — DOXYCYCLINE HYCLATE 100 MG PO CAPS: 100.0000 mg | ORAL_CAPSULE | Freq: Two times a day (BID) | ORAL | 0 refills | Status: DC

## 2021-09-28 NOTE — Progress Notes (Signed)
Subjective:    Patient ID: Cheryl Walters, female    DOB: 1991-04-18, 31 y.o.   MRN: [id number]  HPI  31 year old female who  has a past medical history of Acne, Active preterm labor (10/23/2016), Carpal tunnel syndrome of right wrist, Headache(784.0), Inflammatory bowel diseases (IBD), Irregular periods, and Pilonidal cyst.  She presets to the office today for an acute on chronic issue of left swollen and painful armpit.  She has had this happen in the past but they usually go away.  This instance started roughly 1 to 2 days ago.  She has not noticed any drainage.  Is tender to touch and swollen.  Has not been using anything over-the-counter   Review of Systems See HPI   Past Medical History:  Diagnosis Date   Acne    Active preterm labor 10/23/2016   Carpal tunnel syndrome of right wrist    Headache(784.0)    Inflammatory bowel diseases (IBD)    Irregular periods    Pilonidal cyst     Social History   Socioeconomic History   Marital status: Married    Spouse name: Not on file   Number of children: Not on file   Years of education: Not on file   Highest education level: Not on file  Occupational History   Not on file  Tobacco Use   Smoking status: Never   Smokeless tobacco: Never  Vaping Use   Vaping Use: Never used  Substance and Sexual Activity   Alcohol use: Yes    Alcohol/week: 0.0 standard drinks    Comment: occasionally   Drug use: No   Sexual activity: Yes  Other Topics Concern   Not on file  Social History Narrative   [place]    Was Living at home    [facility] of 4   Played basketball in HS no CV pulm signs   Childbirth in January 2012   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[employer]   Not working out of home at this time    NOw living iwht in [employer] and 4 years okd husband    Small quarters at home all day with 34 yo    To go to pre k if gets accepted  Neg  ocass etoh   Right Handed    Drinks Caffeine    [employer] Home         Social Determinants of Health   Financial Resource Strain: Not on file  Food Insecurity: Not on file  Transportation Needs: Not on file  Physical Activity: Not on file  Stress: Not on file  Social Connections: Not on file  Intimate Partner Violence: Not on file    Past Surgical History:  Procedure Laterality Date   BIRTH CONTROL IMPLANT     CESAREAN SECTION     COLONOSCOPY  03/29/2020   2016    Family History  Problem Relation Age of Onset   Other Mother        irreg periods   Sleep apnea Father        on machine   Other Sister        irreg periods   Breast cancer Maternal Aunt    Brain cancer Paternal Aunt    Colon cancer Neg Hx    Throat cancer Neg Hx    Kidney disease Neg Hx  Liver disease Neg Hx    Diabetes Neg Hx    Heart disease Neg Hx    Stomach cancer Neg Hx    Pancreatic cancer Neg Hx     No Known Allergies  Current Outpatient Medications on File Prior to Visit  Medication Sig Dispense Refill   cyclobenzaprine (FLEXERIL) 5 MG tablet Take 1 tablet (5 mg total) by mouth at bedtime as needed for muscle spasms. 20 tablet 0   hydrocortisone (ANUSOL-HC) 25 MG suppository Place 1 suppository (25 mg total) rectally at bedtime. (Patient taking differently: Place 25 mg rectally as needed.) 14 suppository 1   levonorgestrel (MIRENA) 20 MCG/DAY IUD 1 each by Intrauterine route once.     mesalamine (LIALDA) 1.2 g EC tablet Take 4 tablets (4.8 g total) by mouth daily with breakfast. 360 tablet 3   No current facility-administered medications on file prior to visit.    BP 102/60   Pulse 85   Temp 98.5 F (36.9 C) (Oral)   Ht 5' (1.524 m)   Wt 191 lb (86.6 kg)   SpO2 98%   BMI 37.30 kg/m       Objective:   Physical Exam Vitals and nursing note reviewed.  Constitutional:      Appearance: Normal appearance.  Skin:    General: Skin is warm and dry.     Capillary Refill: Capillary refill takes less than 2  seconds.     Comments: Quarter sized nonfluctuant abscess noted in the left axilla.  Neurological:     General: No focal deficit present.     Mental Status: She is alert and oriented to person, place, and time.  Psychiatric:        Mood and Affect: Mood normal.        Behavior: Behavior normal.        Thought Content: Thought content normal.      Assessment & Plan:  1. Abscess of left axilla - doxycycline (VIBRAMYCIN) 100 MG capsule; Take 1 capsule (100 mg total) by mouth 2 (two) times daily.  Dispense: 20 capsule; Refill: 0  Dorothyann Peng, NP

## 2021-10-30 ENCOUNTER — Encounter: Payer: Self-pay | Admitting: Internal Medicine

## 2021-10-30 ENCOUNTER — Ambulatory Visit (INDEPENDENT_AMBULATORY_CARE_PROVIDER_SITE_OTHER): Payer: 59 | Admitting: Internal Medicine

## 2021-10-30 VITALS — BP 120/80 | HR 78 | Temp 98.5°F | Ht 60.0 in | Wt 188.6 lb

## 2021-10-30 DIAGNOSIS — K51219 Ulcerative (chronic) proctitis with unspecified complications: Secondary | ICD-10-CM | POA: Diagnosis not present

## 2021-10-30 DIAGNOSIS — L02412 Cutaneous abscess of left axilla: Secondary | ICD-10-CM | POA: Diagnosis not present

## 2021-10-30 MED ORDER — CEPHALEXIN 500 MG PO CAPS
500.0000 mg | ORAL_CAPSULE | Freq: Four times a day (QID) | ORAL | 0 refills | Status: DC
Start: 2021-10-30 — End: 2022-03-21

## 2021-10-30 NOTE — Progress Notes (Signed)
Chief Complaint  Patient presents with   Recurrent Skin Infections    Boil under left armpit    HPI: TANISIA YOKLEY 31 y.o. come in for on going poblem  seen  cory  5 18  and given doxy  then went away .  Better  but could still feel area   Now for a few weeks  increasing  and very tender and drains some.   Has hx of a few years  of tender  lumps infections  over the last 4 + years  . Only left  axilla at this time.  Hx of tracking drianaging No fever .  Used to get boils in gu areas  not now.   Had uc    on mesalamine  stable  ocass blood .  Not sure  doxy helped  ROS: See pertinent positives and negatives per HPI.  Past Medical History:  Diagnosis Date   Acne    Active preterm labor 10/23/2016   Carpal tunnel syndrome of right wrist    Headache(784.0)    Inflammatory bowel diseases (IBD)    Irregular periods    Pilonidal cyst     Family History  Problem Relation Age of Onset   Other Mother        irreg periods   Sleep apnea Father        on machine   Other Sister        irreg periods   Breast cancer Maternal Aunt    Brain cancer Paternal Aunt    Colon cancer Neg Hx    Throat cancer Neg Hx    Kidney disease Neg Hx    Liver disease Neg Hx    Diabetes Neg Hx    Heart disease Neg Hx    Stomach cancer Neg Hx    Pancreatic cancer Neg Hx     Social History   Socioeconomic History   Marital status: Married    Spouse name: Not on file   Number of children: Not on file   Years of education: Not on file   Highest education level: Not on file  Occupational History   Not on file  Tobacco Use   Smoking status: Never   Smokeless tobacco: Never  Vaping Use   Vaping Use: Never used  Substance and Sexual Activity   Alcohol use: Yes    Alcohol/week: 0.0 standard drinks of alcohol    Comment: occasionally   Drug use: No   Sexual activity: Yes  Other Topics Concern   Not on file  Social History Narrative   [place]    Was Living at home     [employer] of [facility]   Played basketball in HS no CV pulm signs   Childbirth in January 2012   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[employer]   Not working out of home at this time    NOw living iwht in [employer], [facility] and 4 years okd husband    Small quarters at home all day with 33 yo    To go to pre k if gets accepted  Neg  ocass etoh   Right Handed   Drinks Caffeine    [employer]         Social Determinants of Health   Financial Resource Strain: Not on file  Food Insecurity: Not on file  Transportation Needs:  Not on file  Physical Activity: Not on file  Stress: Not on file  Social Connections: Not on file    Outpatient Medications Prior to Visit  Medication Sig Dispense Refill   cyclobenzaprine (FLEXERIL) 5 MG tablet Take 1 tablet (5 mg total) by mouth at bedtime as needed for muscle spasms. 20 tablet 0   doxycycline (VIBRAMYCIN) 100 MG capsule Take 1 capsule (100 mg total) by mouth 2 (two) times daily. 20 capsule 0   hydrocortisone (ANUSOL-HC) 25 MG suppository Place 1 suppository (25 mg total) rectally at bedtime. (Patient taking differently: Place 25 mg rectally as needed.) 14 suppository 1   levonorgestrel (MIRENA) 20 MCG/DAY IUD 1 each by Intrauterine route once.     mesalamine (LIALDA) 1.2 g EC tablet Take 4 tablets (4.8 g total) by mouth daily with breakfast. 360 tablet 3   No facility-administered medications prior to visit.     EXAM:  BP 120/80 (BP Location: Right Arm, Patient Position: Sitting, Cuff Size: Normal)   Pulse 78   Temp 98.5 F (36.9 C) (Oral)   Ht 5' (1.524 m)   Wt 188 lb 9.6 oz (85.5 kg)   SpO2 99%   BMI 36.83 kg/m   Body mass index is 36.83 kg/m.  GENERAL: vitals reviewed and listed above, alert, oriented, appears well hydrated and in no acute distress HEENT: atraumatic, conjunctiva  clear, no obvious abnormalities on inspection of external nose and ears  NECK: no obvious masses on inspection palpation    Left axilla  larger indurated area  about 4+cm with centeral 1 cm drainage area   not expressed cause of sever tenderness     r aill ai s clear  PSYCH: pleasant and cooperative, no obvious depression or anxiety Lab Results  Component Value Date   WBC 6.5 07/11/2021   HGB 12.6 07/11/2021   HCT 39.4 07/11/2021   PLT 345.0 07/11/2021   GLUCOSE 106 (H) 07/11/2021   CHOL 192 12/03/2007   TRIG 37 12/03/2007   HDL 83.2 12/03/2007   LDLCALC 101 (H) 12/03/2007   ALT 16 07/11/2021   AST 16 07/11/2021   NA 139 07/11/2021   K 4.1 07/11/2021   CL 104 07/11/2021   CREATININE 0.87 07/11/2021   BUN 9 07/11/2021   CO2 29 07/11/2021   TSH 2.502 06/11/2014   HGBA1C 5.3 06/11/2014   BP Readings from Last 3 Encounters:  10/30/21 120/80  09/28/21 102/60  09/14/21 112/67    ASSESSMENT AND PLAN:  Discussed the following assessment and plan:  Abscess of left axilla recurrent  - recurrent ? if HS a cause :  advise surgery  referral for  complex drainage plan  add antibiotic in interim  - Plan: Ambulatory referral to General Surgery  Ulcerative proctitis with complication [facility]) - Plan: Ambulatory referral to General Surgery Gen surgery referral  for  help.  Complex? I and D .  Recurrent problem  consider dx of HS and help for management and suppression in future .   ]avoiding clindamycin today cause of hs of  UC.   Rx kelfex  -Patient advised to return or notify health care team  if  new concerns arise.  Patient Instructions  Warm compresses ,  add antibiotic. Surgery referral for  better I and d . Prob advise dermatology fu  could have hidradeniitis supurativa  .   Contact med team if fever worsening in interim    Standley Brooking. Kimmora Risenhoover M.D.

## 2021-10-30 NOTE — Patient Instructions (Signed)
Warm compresses ,  add antibiotic. Surgery referral for  better I and d . Prob advise dermatology fu  could have hidradeniitis supurativa  .   Contact med team if fever worsening in interim

## 2021-12-05 ENCOUNTER — Ambulatory Visit: Payer: 59 | Admitting: Gastroenterology

## 2021-12-22 ENCOUNTER — Ambulatory Visit (INDEPENDENT_AMBULATORY_CARE_PROVIDER_SITE_OTHER): Payer: 59 | Admitting: Family Medicine

## 2021-12-22 ENCOUNTER — Encounter: Payer: Self-pay | Admitting: Family Medicine

## 2021-12-22 VITALS — BP 116/82 | HR 79 | Temp 98.5°F | Wt 189.0 lb

## 2021-12-22 DIAGNOSIS — N644 Mastodynia: Secondary | ICD-10-CM

## 2021-12-22 NOTE — Progress Notes (Signed)
Subjective:    Patient ID: Cheryl Walters, female    DOB: 02/10/1991, 31 y.o.   MRN: [id number]  Chief Complaint  Patient presents with   Breast Pain    Pain when moves it a certain way. More pain when its pushed up, has gotten better. Has not felt any lumps or masses.    HPI Patient is a 30 year old female followed by Dr. Regis Bill who was seen today for lower right breast pain x2 weeks.  States he notices if pushes breast upward.  Denies skin changes, edema, rash, discharge, masses.  Patient denies changes in medications.  Has IUD in place, due for removal in the next month.  Past Medical History:  Diagnosis Date   Acne    Active preterm labor 10/23/2016   Carpal tunnel syndrome of right wrist    Headache(784.0)    Inflammatory bowel diseases (IBD)    Irregular periods    Pilonidal cyst     No Known Allergies  ROS General: Denies fever, chills, night sweats, changes in weight, changes in appetite HEENT: Denies headaches, ear pain, changes in vision, rhinorrhea, sore throat CV: Denies CP, palpitations, SOB, orthopnea Pulm: Denies SOB, cough, wheezing GI: Denies abdominal pain, nausea, vomiting, diarrhea, constipation GU: Denies dysuria, hematuria, frequency, vaginal discharge +R breast pain Msk: Denies muscle cramps, joint pains Neuro: Denies weakness, numbness, tingling Skin: Denies rashes, bruising Psych: Denies depression, anxiety, hallucinations     Objective:    Blood pressure 116/82, pulse 79, temperature 98.5 F (36.9 C), temperature source Oral, weight 189 lb (85.7 kg), SpO2 98 %.  Gen. Pleasant, well-nourished, in no distress, normal affect   HEENT: [place]/AT, face symmetric, conjunctiva clear, no scleral icterus, PERRLA, EOMI, nares patent without drainage Lungs: no accessory muscle use Cardiovascular: RRR, no peripheral edema Breast: normal appearing breast b/l.  No erythema, edema, peau d'orange.  No masses appreciated in axilla.  Right breast with 6 mm round  cyst proximal areolar at 5 o'clock position. Neuro:  A&Ox3, CN II-XII intact, normal gait Skin:  Warm, no lesions/ rash   Wt Readings from Last 3 Encounters:  12/22/21 189 lb (85.7 kg)  10/30/21 188 lb 9.6 oz (85.5 kg)  09/28/21 191 lb (86.6 kg)    Lab Results  Component Value Date   WBC 6.5 07/11/2021   HGB 12.6 07/11/2021   HCT 39.4 07/11/2021   PLT 345.0 07/11/2021   GLUCOSE 106 (H) 07/11/2021   CHOL 192 12/03/2007   TRIG 37 12/03/2007   HDL 83.2 12/03/2007   LDLCALC 101 (H) 12/03/2007   ALT 16 07/11/2021   AST 16 07/11/2021   NA 139 07/11/2021   K 4.1 07/11/2021   CL 104 07/11/2021   CREATININE 0.87 07/11/2021   BUN 9 07/11/2021   CO2 29 07/11/2021   TSH 2.502 06/11/2014   HGBA1C 5.3 06/11/2014    Assessment/Plan:  Mastalgia  -Discussed various causes.  Decrease in level of hormones from Mirena IUD may be contributing as it is due for removal. -Discussed expectant management including ice, heat, Tylenol as needed -Given handout - Plan: US BREAST COMPLETE UNI RIGHT INC AXILLA  F/u as needed  Grier Mitts, MD

## 2021-12-28 ENCOUNTER — Other Ambulatory Visit: Payer: Self-pay | Admitting: Family Medicine

## 2021-12-28 DIAGNOSIS — N644 Mastodynia: Secondary | ICD-10-CM

## 2022-01-05 ENCOUNTER — Telehealth: Payer: Self-pay

## 2022-01-05 NOTE — Telephone Encounter (Signed)
Pt states she had shingles (cannot remember dates) & wanted to know if she should get shingles vaccination. Pt aware that PCP out of office until after labor day & won't hear back until then. Pt verb understanding.

## 2022-01-11 ENCOUNTER — Ambulatory Visit
Admission: RE | Admit: 2022-01-11 | Discharge: 2022-01-11 | Disposition: A | Discharge status: Home / Self Care | Payer: 59 | Source: Ambulatory Visit | Attending: Family Medicine | Admitting: Family Medicine

## 2022-01-11 ENCOUNTER — Ambulatory Visit: Admission: RE | Admit: 2022-01-11 | Payer: 59 | Source: Ambulatory Visit

## 2022-01-11 DIAGNOSIS — N644 Mastodynia: Secondary | ICD-10-CM

## 2022-01-19 NOTE — Telephone Encounter (Signed)
Inform patient of provider' advise on shingle vaccine. Patient verbalized understanding.

## 2022-03-21 ENCOUNTER — Ambulatory Visit: Payer: 59 | Admitting: Family Medicine

## 2022-03-21 ENCOUNTER — Ambulatory Visit (INDEPENDENT_AMBULATORY_CARE_PROVIDER_SITE_OTHER): Payer: 59 | Admitting: Internal Medicine

## 2022-03-21 ENCOUNTER — Encounter: Payer: Self-pay | Admitting: Internal Medicine

## 2022-03-21 VITALS — BP 98/78 | HR 66 | Temp 98.2°F | Wt 187.4 lb

## 2022-03-21 DIAGNOSIS — R519 Headache, unspecified: Secondary | ICD-10-CM

## 2022-03-21 DIAGNOSIS — Z8669 Personal history of other diseases of the nervous system and sense organs: Secondary | ICD-10-CM

## 2022-03-21 NOTE — Progress Notes (Signed)
Chief Complaint  Patient presents with   Headache    Pt reports the sx started 4 days ago. Intermittent. Pt states the pain located all over her head.     HPI: Cheryl Walters 31 y.o. come in for  Headache sda with son who is having abdominal pain She had a fever few weeks ago but not recently no cough congestion Has taken Tylenol without help feels it is a general headache and not typical of her migraines for which she had a number of years ago and had evaluation. No trauma sinus pain vomiting or neurologic signs. Hx of care with Dr Loretta Plume last visit 8 21   nortriptyline  and  prn rizatritan  ROS: See pertinent positives and negatives per HPI.  Past Medical History:  Diagnosis Date   Acne    Active preterm labor 10/23/2016   Carpal tunnel syndrome of right wrist    Headache(784.0)    Inflammatory bowel diseases (IBD)    Irregular periods    Pilonidal cyst     Family History  Problem Relation Age of Onset   Other Mother        irreg periods   Sleep apnea Father        on machine   Other Sister        irreg periods   Breast cancer Maternal Aunt    Brain cancer Paternal Aunt    Colon cancer Neg Hx    Throat cancer Neg Hx    Kidney disease Neg Hx    Liver disease Neg Hx    Diabetes Neg Hx    Heart disease Neg Hx    Stomach cancer Neg Hx    Pancreatic cancer Neg Hx     Social History   Socioeconomic History   Marital status: Married    Spouse name: Not on file   Number of children: Not on file   Years of education: Not on file   Highest education level: Not on file  Occupational History   Not on file  Tobacco Use   Smoking status: Never   Smokeless tobacco: Never  Vaping Use   Vaping Use: Never used  Substance and Sexual Activity   Alcohol use: Yes    Alcohol/week: 0.0 standard drinks of alcohol    Comment: occasionally   Drug use: No   Sexual activity: Yes  Other Topics Concern   Not on file  Social History Narrative   [place]     Was Living at home    [facility] of [facility]   Played basketball in HS no CV pulm signs   Childbirth in January 2012   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[employer]   Not working out of home at this time    NOw living iwht in [employer] and 4 years okd husband    Small quarters at home all day with 33 yo    To go to pre k if gets accepted  Neg  ocass etoh   Right Handed   Drinks Caffeine    [employer]         Social Determinants of Health   Financial Resource Strain: Not on file  Food Insecurity: Not on file  Transportation Needs: Not on file  Physical Activity: Not on file  Stress: Not on file  Social Connections: Not on file  Outpatient Medications Prior to Visit  Medication Sig Dispense Refill   levonorgestrel (MIRENA) 20 MCG/DAY IUD 1 each by Intrauterine route once.     mesalamine (LIALDA) 1.2 g EC tablet Take 4 tablets (4.8 g total) by mouth daily with breakfast. 360 tablet 3   cephALEXin (KEFLEX) 500 MG capsule Take 1 capsule (500 mg total) by mouth 4 (four) times daily. (Patient not taking: Reported on 12/22/2021) 28 capsule 0   cyclobenzaprine (FLEXERIL) 5 MG tablet Take 1 tablet (5 mg total) by mouth at bedtime as needed for muscle spasms. (Patient not taking: Reported on 12/22/2021) 20 tablet 0   doxycycline (VIBRAMYCIN) 100 MG capsule Take 1 capsule (100 mg total) by mouth 2 (two) times daily. (Patient not taking: Reported on 12/22/2021) 20 capsule 0   hydrocortisone (ANUSOL-HC) 25 MG suppository Place 1 suppository (25 mg total) rectally at bedtime. (Patient not taking: Reported on 12/22/2021) 14 suppository 1   No facility-administered medications prior to visit.     EXAM:  BP 98/78 (BP Location: Left Arm, Patient Position: Sitting, Cuff Size: Large)   Pulse 66   Temp 98.2 F (36.8 C) (Oral)   Wt 187 lb 6.4 oz (85 kg)   BMI 36.60 kg/m   Body mass index is 36.6 kg/m.  GENERAL: vitals reviewed and listed above, alert,  oriented, appears well hydrated and in no acute distress HEENT: atraumatic, conjunctiva  clear, no obvious abnormalities on inspection of external nose and ears  eoms perrl  tms clear  OP : no lesion edema or exudate  NECK: no obvious masses on inspection palpation  CV: HRRR, no clubbing cyanosis or  peripheral edema nl cap refill  NEURO: oriented x 3 CN 3-12 appear intact. No focal muscle weakness or atrophy. DTRs symmetrical. Gait WNL.  Grossly non focal. No tremor or abnormal movement. MS: moves all extremities without noticeable focal  abnormality PSYCH: pleasant and cooperative, no obvious depression or anxiety  BP Readings from Last 3 Encounters:  03/21/22 98/78  12/22/21 116/82  10/30/21 120/80    ASSESSMENT AND PLAN:  Discussed the following assessment and plan:  Recurrent headache  History of migraine Episode  no alarm features   sx if can use ibo or aleve    fu if  persistent or progressive or if alarm sx   Stay hydrated  get enough sleep  .  -Patient advised to return or notify health care team  if  new concerns arise.  Patient Instructions  No alarming sx this could be a variant since  you have had migraines .  Tyr advile aleve type as needed but do not use for more than a few days in a row . Fu if    persistent or progressive . As we discussed   Can get back with dr Loretta Plume also     Cheryl Walters. Cheryl Walters M.D.

## 2022-03-21 NOTE — Patient Instructions (Signed)
No alarming sx this could be a variant since  you have had migraines .  Tyr advile aleve type as needed but do not use for more than a few days in a row . Fu if    persistent or progressive . As we discussed   Can get back with dr Loretta Plume also

## 2022-05-16 ENCOUNTER — Ambulatory Visit (INDEPENDENT_AMBULATORY_CARE_PROVIDER_SITE_OTHER): Payer: 59 | Admitting: Physician Assistant

## 2022-05-16 ENCOUNTER — Encounter: Payer: Self-pay | Admitting: Physician Assistant

## 2022-05-16 ENCOUNTER — Other Ambulatory Visit (INDEPENDENT_AMBULATORY_CARE_PROVIDER_SITE_OTHER): Payer: 59

## 2022-05-16 VITALS — BP 98/68 | HR 79 | Ht 60.0 in | Wt 183.0 lb

## 2022-05-16 DIAGNOSIS — K5120 Ulcerative (chronic) proctitis without complications: Secondary | ICD-10-CM

## 2022-05-16 LAB — CBC WITH DIFFERENTIAL/PLATELET
Basophils Absolute: 0.1 10*3/uL (ref 0.0–0.1)
Basophils Relative: 1 % (ref 0.0–3.0)
Eosinophils Absolute: 0.2 10*3/uL (ref 0.0–0.7)
Eosinophils Relative: 2.9 % (ref 0.0–5.0)
HCT: 39.7 % (ref 36.0–46.0)
Hemoglobin: 13 g/dL (ref 12.0–15.0)
Lymphocytes Relative: 31.3 % (ref 12.0–46.0)
Lymphs Abs: 2.5 10*3/uL (ref 0.7–4.0)
MCHC: 32.7 g/dL (ref 30.0–36.0)
MCV: 84.5 fl (ref 78.0–100.0)
Monocytes Absolute: 0.6 10*3/uL (ref 0.1–1.0)
Monocytes Relative: 7.3 % (ref 3.0–12.0)
Neutro Abs: 4.7 10*3/uL (ref 1.4–7.7)
Neutrophils Relative %: 57.5 % (ref 43.0–77.0)
Platelets: 322 10*3/uL (ref 150.0–400.0)
RBC: 4.71 Mil/uL (ref 3.87–5.11)
RDW: 14.2 % (ref 11.5–15.5)
WBC: 8.2 10*3/uL (ref 4.0–10.5)

## 2022-05-16 LAB — COMPREHENSIVE METABOLIC PANEL
ALT: 11 U/L (ref 0–35)
AST: 13 U/L (ref 0–37)
Albumin: 4 g/dL (ref 3.5–5.2)
Alkaline Phosphatase: 60 U/L (ref 39–117)
BUN: 7 mg/dL (ref 6–23)
CO2: 28 mEq/L (ref 19–32)
Calcium: 9.3 mg/dL (ref 8.4–10.5)
Chloride: 105 mEq/L (ref 96–112)
Creatinine, Ser: 0.95 mg/dL (ref 0.40–1.20)
GFR: 78.77 mL/min (ref >60.00)
Glucose, Bld: 117 mg/dL — ABNORMAL HIGH (ref 70–99)
Potassium: 4.4 mEq/L (ref 3.5–5.1)
Sodium: 139 mEq/L (ref 135–145)
Total Bilirubin: 0.3 mg/dL (ref 0.2–1.2)
Total Protein: 7.5 g/dL (ref 6.0–8.3)

## 2022-05-16 MED ORDER — MESALAMINE 1.2 G PO TBEC: 4.8000 g | DELAYED_RELEASE_TABLET | Freq: Every day | ORAL | 3 refills | Status: DC

## 2022-05-16 NOTE — Progress Notes (Signed)
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    03/29/2020 colonoscopy with normal mucosa in the sigmoid, descending, transverse and ascending colon.  Terminal ileum appeared normal.  Continues area of ulcerated mucosa with stigmata of recent bleeding in the rectum, nonbleeding internal hemorrhoids medium in size.  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  Repeat recommended in 5 years.    09/13/2021 patient seen in clinic by Dr. Silverio Decamp for ulcerative proctitis.  At that time she was doing well with on average 2-3 bowel movements a day and noticed a decrease in rectal bleeding.  Initial diagnosis of proctitis in 2016 at colonoscopy with Dr. Maurene Capes with finding of proctitis from 0 to 5 cm diffusely.  Path showed chronic active inflammation with crypt distortion but no granulomas and felt consistent with IBD.  At that time patient's mesalamine was increased to 4.8 g daily and she was advised to start taking a prenatal multivitamin given she had borderline low B12, folate and ferritin.  Told to return in 3 months.    Today, the patient presents to clinic and tells me that she is fine.  When she called to make the appointment she had had some sharp shooting rectal pain but this only lasted for 2 days and since then it completely went away.  She is doing well on her Mesalamine 4.8 g daily and has no complaints today.  Still experiences occasional blood in her stool.    She has 2 little boys.    Denies fever, chills, weight loss, nausea or vomiting.  Past Medical History:  Diagnosis Date   Acne    Active preterm labor 10/23/2016   Carpal tunnel syndrome of right wrist    Headache(784.0)    Inflammatory bowel diseases (IBD)    Irregular periods     Pilonidal cyst    Ulcerative proctitis (HCC)     Past Surgical History:  Procedure Laterality Date   BIRTH CONTROL IMPLANT     CESAREAN SECTION     COLONOSCOPY  03/29/2020   2016    Current Outpatient Medications  Medication Sig Dispense Refill   levonorgestrel (MIRENA) 20 MCG/DAY IUD 1 each by Intrauterine route once.     mesalamine (LIALDA) 1.2 g EC tablet Take 4 tablets (4.8 g total) by mouth daily with breakfast. 360 tablet 3   No current facility-administered medications for this visit.    Allergies as of 05/16/2022   (No Known Allergies)    Family History  Problem Relation Age of Onset   Other Mother        irreg periods   Sleep apnea Father        on machine   Other Sister        irreg periods   Breast cancer Maternal Aunt    Brain cancer Paternal Aunt    Colon cancer Neg Hx    Throat cancer Neg Hx    Kidney disease Neg Hx    Liver disease Neg Hx    Diabetes Neg Hx    Heart disease Neg Hx    Stomach cancer Neg Hx    Pancreatic cancer Neg Hx     Social History   Socioeconomic History   Marital status: Married  Spouse name: Not on file   Number of children: Not on file   Years of education: Not on file   Highest education level: Not on file  Occupational History   Not on file  Tobacco Use   Smoking status: Never   Smokeless tobacco: Never  Vaping Use   Vaping Use: Never used  Substance and Sexual Activity   Alcohol use: Yes    Alcohol/week: 0.0 standard drinks of alcohol    Comment: occasionally   Drug use: No   Sexual activity: Yes  Other Topics Concern   Not on file  Social History Narrative   [place]    Was Living at home    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[employer]   Not working out of home at this time    NOw living iwht in [employer] [employer] and 4 years okd husband    Small quarters at home  all day with 34 yo    To go to pre k if gets accepted  Neg  ocass etoh   Right Handed   Drinks Caffeine    [employer]         Social Determinants of Health   Financial Resource Strain: Not on file  Food Insecurity: Not on file  Transportation Needs: Not on file  Physical Activity: Not on file  Stress: Not on file  Social Connections: Not on file  Intimate Partner Violence: Not on file    Review of Systems:    Constitutional: No weight loss, fever or chills Cardiovascular: No chest pain Respiratory: No SOB Gastrointestinal: See HPI and otherwise negative   Physical Exam:  Vital signs: BP 98/68   Pulse 79   Ht 5' (1.524 m)   Wt 183 lb (83 kg)   BMI 35.74 kg/m    Constitutional:   Pleasant female appears to be in NAD, Well developed, Well nourished, alert and cooperative  Respiratory: Respirations even and unlabored. Lungs clear to auscultation bilaterally.   No wheezes, crackles, or rhonchi.  Cardiovascular: Normal S1, S2. No MRG. Regular rate and rhythm. No peripheral edema, cyanosis or pallor.  Gastrointestinal:  Soft, nondistended, nontender. No rebound or guarding. Normal bowel sounds. No appreciable masses or hepatomegaly. Rectal:  Not performed.  Psychiatric: Oriented to person, place and time. Demonstrates good judgement and reason without abnormal affect or behaviors.  No recent labs or imaging.  Assessment: 1.  Ulcerative proctitis: No complication on Mesalamine 4.8 g daily, next colonoscopy due 03/2025  Plan: 1.  Repeat CBC and CMP today for medication maintenance 2.  Continue Mesalamine 4.8 g daily, prescribed #360 with 3 refills 3.  Patient to follow in clinic in a year or sooner if necessary.  Ellouise Newer, PA-C [facility] 05/16/2022, 11:10 AM  Cc: Burnis Medin, MD

## 2022-05-16 NOTE — Patient Instructions (Signed)
Your provider has requested that you go to the basement level for lab work before leaving today. Press "B" on the elevator. The lab is located at the first door on the left as you exit the elevator.   Due to recent changes in healthcare laws, you may see the results of your imaging and laboratory studies on MyChart before your provider has had a chance to review them.  We understand that in some cases there may be results that are confusing or concerning to you. Not all laboratory results come back in the same time frame and the provider may be waiting for multiple results in order to interpret others.  Please give [place] 48 hours in order for your provider to thoroughly review all the results before contacting the office for clarification of your results.    We have sent the following medications to your pharmacy for you to pick up at your convenience: Mesalamine   I appreciate the opportunity to care for you. Ellouise Newer, PA-C

## 2022-07-27 ENCOUNTER — Ambulatory Visit (INDEPENDENT_AMBULATORY_CARE_PROVIDER_SITE_OTHER): Payer: 59 | Admitting: Family Medicine

## 2022-07-27 ENCOUNTER — Encounter: Payer: Self-pay | Admitting: Family Medicine

## 2022-07-27 VITALS — BP 110/70 | HR 79 | Temp 97.9°F | Resp 12 | Ht 60.0 in | Wt 179.4 lb

## 2022-07-27 DIAGNOSIS — H6992 Unspecified Eustachian tube disorder, left ear: Secondary | ICD-10-CM

## 2022-07-27 DIAGNOSIS — R059 Cough, unspecified: Secondary | ICD-10-CM | POA: Diagnosis not present

## 2022-07-27 DIAGNOSIS — J069 Acute upper respiratory infection, unspecified: Secondary | ICD-10-CM

## 2022-07-27 LAB — POCT INFLUENZA A/B
Influenza A, POC: NEGATIVE
Influenza B, POC: NEGATIVE

## 2022-07-27 LAB — POC COVID19 BINAXNOW: SARS Coronavirus 2 Ag: NEGATIVE

## 2022-07-27 MED ORDER — FLUTICASONE PROPIONATE 50 MCG/ACT NA SUSP: 1.0000 | Freq: Two times a day (BID) | NASAL | 0 refills | Status: DC

## 2022-07-27 MED ORDER — BENZONATATE 100 MG PO CAPS: 200.0000 mg | ORAL_CAPSULE | Freq: Two times a day (BID) | ORAL | 0 refills | Status: AC | PRN

## 2022-07-27 NOTE — Patient Instructions (Signed)
A few things to remember from today's visit:  Cough, unspecified type - Plan: POC COVID-19, POCT Influenza A/B  URI, acute  Dysfunction of left eustachian tube  Here today COVID test was negative, repeat test at home Sunday and let us know if positive. Monitor temp and for new symptoms.  Please be sure medication list is accurate. If a new problem present, please set up appointment sooner than planned today.

## 2022-07-27 NOTE — Progress Notes (Signed)
ACUTE VISIT Chief Complaint  Patient presents with   Cough    Started today, non productive, dry, denies fever.    Headache   HPI: Cheryl Walters is a 32 y.o. female, who is here today complaining of above symptoms that is started today. Fatigue, rhinorrhea, postnasal drainage, nonproductive cough, and headache. Initially she had frontal pressure headache, today she has more generalized headache, mild, not associated with visual changes or focal neurologic deficit. 2 days ago she had mild sore throat, resolved.  Cough This is a new problem. The current episode started today. The problem has been unchanged. The cough is Non-productive. Associated symptoms include ear congestion, ear pain, headaches and rhinorrhea. Pertinent negatives include no chest pain, chills, fever, heartburn, hemoptysis, myalgias, nasal congestion, rash, sore throat, shortness of breath, sweats or wheezing. Nothing aggravates the symptoms. She has tried nothing for the symptoms.  Yesterday left earache. Her son had influenza and COVID-19 infection a week ago. She has not performed COVID test at home. She has not taking any OTC medication for her symptoms. No known history of allergies.  Review of Systems  Constitutional:  Positive for fatigue. Negative for chills and fever.  HENT:  Positive for ear pain and rhinorrhea. Negative for sore throat.   Respiratory:  Positive for cough. Negative for hemoptysis, shortness of breath and wheezing.   Cardiovascular:  Negative for chest pain.  Gastrointestinal:  Negative for heartburn.  Genitourinary:  Negative for decreased urine volume, dysuria and hematuria.  Musculoskeletal:  Negative for myalgias.  Skin:  Negative for rash.  Neurological:  Positive for headaches.  See other pertinent positives and negatives in HPI.  Current Outpatient Medications on File Prior to Visit  Medication Sig Dispense Refill   levonorgestrel (MIRENA) 20 MCG/DAY IUD 1 each by  Intrauterine route once.     mesalamine (LIALDA) 1.2 g EC tablet Take 4 tablets (4.8 g total) by mouth daily with breakfast. 360 tablet 3   No current facility-administered medications on file prior to visit.   Past Medical History:  Diagnosis Date   Acne    Active preterm labor 10/23/2016   Carpal tunnel syndrome of right wrist    Headache(784.0)    Inflammatory bowel diseases (IBD)    Irregular periods    Pilonidal cyst    Ulcerative proctitis (HCC)    No Known Allergies  Social History   Socioeconomic History   Marital status: Married    Spouse name: Not on file   Number of children: 2   Years of education: Not on file   Highest education level: Not on file  Occupational History   Not on file  Tobacco Use   Smoking status: Never   Smokeless tobacco: Never  Vaping Use   Vaping Use: Never used  Substance and Sexual Activity   Alcohol use: Yes    Alcohol/week: 0.0 standard drinks of alcohol    Comment: occasionally   Drug use: No   Sexual activity: Yes  Other Topics Concern   Not on file  Social History Narrative   [place]    Was Living at home    [facility] of 4   Played basketball in HS no CV pulm signs   Childbirth in January 2012   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  Physical  Exam Vitals and nursing note reviewed.  Constitutional:      General: She is not in acute distress.    Appearance: She is well-developed. She is not ill-appearing.  HENT:     Head: Atraumatic.     Right Ear: Tympanic membrane, ear canal and external ear normal.     Left Ear: Ear canal and external ear normal. A middle ear effusion is present. Tympanic membrane is not erythematous.     Nose: Rhinorrhea present.     Right Turbinates: Enlarged.     Left Turbinates: Enlarged.     Mouth/Throat:     Mouth: Mucous membranes are moist.     Pharynx: Uvula midline. No oropharyngeal exudate, posterior oropharyngeal erythema or uvula swelling.  Eyes:     Conjunctiva/sclera: Conjunctivae normal.  Cardiovascular:     Rate and Rhythm: Normal rate and regular rhythm.     Heart sounds: No murmur heard. Pulmonary:     Effort: Pulmonary effort is normal. No respiratory distress.     Breath sounds: Normal breath sounds. No stridor.  Musculoskeletal:     Cervical back: Neck supple.  Lymphadenopathy:     Head:     Right side of head: No submandibular adenopathy.     Left side of head: No submandibular adenopathy.     Cervical: No cervical adenopathy.  Skin:    General: Skin is warm.     Findings: No erythema or rash.  Neurological:     Mental Status: She is alert and oriented to person, place, and time.  Psychiatric:        Mood and Affect: Mood and affect normal.   ASSESSMENT AND PLAN: Cheryl Walters was seen today for cough and headache.  URI, acute Symptoms suggests a viral etiology, symptomatic treatment recommended. Here in the office rapid flu test and COVID-19 test were negative. Instructed to repeat COVID-19 test in 2 days and to let her know if it is positive. Monitor for new symptoms, including fever. Recommend DayQuil and NyQuil for symptoms management. Note for work provided in case new symptoms present.  F/U as needed.  -     Fluticasone Propionate; Place 1 spray into both  nostrils 2 (two) times daily.  Dispense: 16 g; Refill: 0  Cough, unspecified type Adequate hydration. I also explained that cough and nasal congestion can last a few days and sometimes weeks.  -     POC COVID-19 BinaxNow -     POCT Influenza A/B -     Benzonatate; Take 2 capsules (200 mg total) by mouth 2 (two) times daily as needed for up to 10 days.  Dispense: 30 capsule; Refill: 0  Dysfunction of left eustachian tube  We discussed diagnosis, prognosis, treatment options. OTC decongestant may help, we discussed some side effects. Recommend auto inflation maneuvers a few times throughout the day. Instructed about warning signs.  Return if symptoms worsen or fail to improve.  Carl Butner G. Martinique, MD  [facility]. [place] office.

## 2022-08-23 ENCOUNTER — Telehealth: Payer: Self-pay | Admitting: Physician Assistant

## 2022-08-23 NOTE — Telephone Encounter (Signed)
Patient is calling to set an appointment states she is having rectal pain and feels like there is a lump on the inside. Says she does not want to wait until June to be seen given that she is in a lot of pain. Please advise

## 2022-08-23 NOTE — Telephone Encounter (Signed)
Returned call to patient. I informed patient that unfortunately we do not have any sooner appts at this time. I informed patient that she does need to be seen to determine if the lump" is a hemorrhoid or if it is something else. I advised pt that since she is having significant pain she should reach out to her PCP or be evaluated in an urgent care at this time. Pt verbalized understanding and had no concerns at the end of the call.

## 2022-08-24 ENCOUNTER — Encounter: Payer: Self-pay | Admitting: Family Medicine

## 2022-08-24 ENCOUNTER — Ambulatory Visit (INDEPENDENT_AMBULATORY_CARE_PROVIDER_SITE_OTHER): Payer: 59 | Admitting: Family Medicine

## 2022-08-24 ENCOUNTER — Ambulatory Visit: Payer: 59 | Admitting: Family Medicine

## 2022-08-24 VITALS — BP 100/60 | HR 76 | Temp 97.9°F | Ht 60.0 in | Wt 181.6 lb

## 2022-08-24 DIAGNOSIS — R202 Paresthesia of skin: Secondary | ICD-10-CM | POA: Diagnosis not present

## 2022-08-24 DIAGNOSIS — K625 Hemorrhage of anus and rectum: Secondary | ICD-10-CM

## 2022-08-24 DIAGNOSIS — G5601 Carpal tunnel syndrome, right upper limb: Secondary | ICD-10-CM

## 2022-08-24 DIAGNOSIS — K51219 Ulcerative (chronic) proctitis with unspecified complications: Secondary | ICD-10-CM

## 2022-08-24 DIAGNOSIS — K649 Unspecified hemorrhoids: Secondary | ICD-10-CM

## 2022-08-24 DIAGNOSIS — K6289 Other specified diseases of anus and rectum: Secondary | ICD-10-CM

## 2022-08-24 DIAGNOSIS — K529 Noninfective gastroenteritis and colitis, unspecified: Secondary | ICD-10-CM

## 2022-08-24 DIAGNOSIS — K580 Irritable bowel syndrome with diarrhea: Secondary | ICD-10-CM

## 2022-08-24 NOTE — Progress Notes (Signed)
Established Patient Office Visit   Subjective  Patient ID: Cheryl Walters, female    DOB: 1991/04/23  Age: 32 y.o. MRN: [id number]  Chief Complaint  Patient presents with   Rectal Pain    Patient complains of rectal pain, x3 days     Patient is a 32 year old female followed by Dr. Fabian Sharp and seen for ongoing concern.  Patient endorses rectal pain x 3 days.  Patient also mentions BRBPR which is "typical" for her.  Endorses loose stools.  Seen by GI.  Would like 2nd opinion.  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  Given rx but not taking.  Seen by [facility] for R hand pain/paresthesias.   Had NCS/EMG, dx'd with carpal tunnel.  Still having symptoms.  Inquires about treatment options.    Past Medical History:  Diagnosis Date   Acne    Active preterm labor 10/23/2016   Carpal tunnel syndrome of right wrist    Headache(784.0)    Inflammatory bowel diseases (IBD)    Irregular periods    Pilonidal cyst    Ulcerative proctitis (HCC)    Past Surgical History:  Procedure Laterality Date   BIRTH CONTROL IMPLANT     CESAREAN SECTION     COLONOSCOPY  03/29/2020   2016   Social History   Tobacco Use   Smoking status: Never   Smokeless tobacco: Never  Vaping Use   Vaping Use: Never used  Substance Use Topics   Alcohol use: Yes    Alcohol/week: 0.0 standard drinks of alcohol    Comment: occasionally   Drug use: No   Family History  Problem Relation Age of Onset   Other Mother        irreg periods   Sleep apnea Father        on machine   Other Sister        irreg periods   Breast cancer Maternal Aunt    Brain cancer Paternal Aunt    Colon cancer Neg Hx    Throat cancer Neg Hx    Kidney disease Neg Hx    Liver disease Neg Hx    Diabetes Neg Hx    Heart disease Neg Hx    Stomach cancer Neg Hx    Pancreatic cancer Neg Hx    No Known Allergies    ROS Negative unless stated above    Objective:     BP 100/60 (BP Location: Left Arm, Patient  Position: Sitting, Cuff Size: Normal)   Pulse 76   Temp 97.9 F (36.6 C) (Oral)   Ht 5' (1.524 m)   Wt 181 lb 9.6 oz (82.4 kg)   SpO2 98%   BMI 35.47 kg/m  BP Readings from Last 3 Encounters:  08/24/22 100/60  07/27/22 110/70  05/16/22 98/68   Wt Readings from Last 3 Encounters:  08/24/22 181 lb 9.6 oz (82.4 kg)  07/27/22 179 lb 6 oz (81.4 kg)  05/16/22 183 lb (83 kg)      Physical Exam Constitutional:      General: She is not in acute distress.    Appearance: Normal appearance.  HENT:     Head: Normocephalic and atraumatic.     Nose: Nose normal.     Mouth/Throat:     Mouth: Mucous membranes are moist.  Cardiovascular:     Rate and Rhythm: Normal rate and regular rhythm.     Heart sounds: Normal heart sounds. No murmur heard.    No gallop.  Pulmonary:     Effort: Pulmonary effort is normal. No respiratory distress.     Breath sounds: Normal breath sounds. No wheezing, rhonchi or rales.  Genitourinary:    Comments: DRE declined. Skin:    General: Skin is warm and dry.  Neurological:     Mental Status: She is alert and oriented to person, place, and time.     No results found for any visits on 08/24/22.    Assessment & Plan:  BRBPR (bright red blood per rectum) -likely 2/2 ulcerative proctitis. -     Ambulatory referral to Gastroenterology  Inflammatory bowel disease -     Ambulatory referral to Gastroenterology  Paresthesias in right hand -2/2 carpal tunnel -supportive care including wrist splint at night, ice, NSAIDs, etc. -had EMG/NCS with [facility]? -referral for 2nd opinion placed -     Ambulatory referral to Hand Surgery  Hemorrhoids, unspecified hemorrhoid type -2/2 increased BMs -OTC medications, sitz baths, etc. -     Ambulatory referral to Gastroenterology  Carpal tunnel syndrome of right wrist -     Ambulatory referral to Hand Surgery  Rectal pain -likely 2/2 ulcerative proctitis -restart mesalamine -     Ambulatory referral to  Gastroenterology  Ulcerative proctitis with complication (HCC) -restart mesalamine -Referral to GI -Given strict precautions.  Return if symptoms worsen or fail to improve.   Deeann Saint, MD

## 2022-08-24 NOTE — Patient Instructions (Signed)
I put some information on your sheet about Crohn's disease.  I think at some point you are diagnosed with this.  This can cause inflammation, rectal pain, bleeding, constipation or diarrhea that is different from IBS.  The referrals for the hand surgeon and new GI were placed.  You should expect phone calls about scheduling these appointments.  If symptoms become worse over the weekend proceed to nearest emergency department.

## 2022-09-04 NOTE — Telephone Encounter (Signed)
Discussed with pt that we do not have any earlier appts. Explained you can have BRB from internal hemorrhoids if that if the issue and discussed seeing PCP or urgent care until her scheduled appt. Also discussed using prep h supp otc prior to her appt. Pt verbalized understanding.

## 2022-09-04 NOTE — Telephone Encounter (Signed)
Inbound call from patient, wishing to speak with a nurse in regards to her appointment scheduled for 6/6. She is wishing to have a sooner appointment, stated she has now started bleeding rectally. Please advise.

## 2022-10-18 ENCOUNTER — Ambulatory Visit (INDEPENDENT_AMBULATORY_CARE_PROVIDER_SITE_OTHER): Payer: 59 | Admitting: Physician Assistant

## 2022-10-18 ENCOUNTER — Encounter: Payer: Self-pay | Admitting: Physician Assistant

## 2022-10-18 VITALS — BP 108/72 | HR 74 | Ht 60.0 in | Wt 182.0 lb

## 2022-10-18 DIAGNOSIS — K51211 Ulcerative (chronic) proctitis with rectal bleeding: Secondary | ICD-10-CM

## 2022-10-18 MED ORDER — MESALAMINE 1000 MG RE SUPP: 1000.0000 mg | Freq: Every day | RECTAL | 0 refills | Status: DC

## 2022-10-18 NOTE — Patient Instructions (Signed)
We have sent the following medications to your pharmacy for you to pick up at your convenience: Canasa Suppositories  Continue Lialda daily  _______________________________________________________  If your blood pressure at your visit was 140/90 or greater, please contact your primary care physician to follow up on this.  _______________________________________________________  If you are age 34 or older, your body mass index should be between 23-30. Your Body mass index is 35.54 kg/m. If this is out of the aforementioned range listed, please consider follow up with your Primary Care Provider.  If you are age 49 or younger, your body mass index should be between 19-25. Your Body mass index is 35.54 kg/m. If this is out of the aformentioned range listed, please consider follow up with your Primary Care Provider.   ________________________________________________________  The [facility] providers would like to encourage you to use [facility] to communicate with providers for non-urgent requests or questions.  Due to long hold times on the telephone, sending your provider a message by [facility] may be a faster and more efficient way to get a response.  Please allow 48 business hours for a response.  Please remember that this is for non-urgent requests.  _______________________________________________________   I appreciate the  opportunity to care for you  Thank You   Jacelyn Grip

## 2022-10-18 NOTE — Progress Notes (Signed)
Chief Complaint: Rectal pain  HPI:    Cheryl Walters is a 32 year old female with past medical history as listed below including ulcerative proctitis, known to Dr. Lavon Paganini, who was referred to me by Panosh, Neta Mends, MD for a complaint of rectal pain.      03/29/2020 colonoscopy with normal mucosa in the sigmoid, descending, transverse and ascending colon.  Terminal ileum appeared normal.  Continues area of ulcerated mucosa with stigmata of recent bleeding in the rectum, nonbleeding internal hemorrhoids medium in size.  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  Repeat recommended in 5 years.    09/13/2021 patient seen in clinic by Dr. Lavon Paganini for ulcerative proctitis.  At that time she was doing well with on average 2-3 bowel movements a day and noticed a decrease in rectal bleeding.  Initial diagnosis of proctitis in 2016 at colonoscopy with Dr. Dickie La with finding of proctitis from 0 to 5 cm diffusely.  Path showed chronic active inflammation with crypt distortion but no granulomas and felt consistent with IBD.  At that time patient's mesalamine was increased to 4.8 g daily and she was advised to start taking a prenatal multivitamin given she had borderline low B12, folate and ferritin.  Told to return in 3 months.    05/16/2022 patient seen in clinic and patient describes doing well.  Continued on Mesalamine 4.8 g daily.  At that time repeated CBC and CMP for medication maintenance.  Told to return to clinic in a year or as needed as needed.    08/24/2022 patient seen by PCP for rectal pain for 3 days and bright red blood per rectum.  At that time she was told that likely this was her ulcerative proctitis.  She was told to restart her mesalamine which apparently she was not taking.    Today, the patient tells me that she has stopped her Lialda for about a month and a half or so and then started back with bright red blood per rectum.  She  saw her PCP as above and was told to go back on her Mesalamine as likely this was her ulcerative proctitis.  Currently she is taking this over the past month, tells me the pills are so large that sometimes it is difficult to take.  She tells me she has been still passing some blood per rectum and sometimes feels like she needs to go to the bathroom but nothing comes out and sometimes when she pushes she will pass a "jellylike clot".  Also some right lower quadrant abdominal discomfort.  No diarrhea.     Denies fever, chills, weight loss, nausea or vomiting.  Past Medical History:  Diagnosis Date   Acne    Active preterm labor 10/23/2016   Carpal tunnel syndrome of right wrist    Headache(784.0)    Inflammatory bowel diseases (IBD)    Irregular periods    Pilonidal cyst    Ulcerative proctitis (HCC)     Past Surgical History:  Procedure Laterality Date   BIRTH CONTROL IMPLANT     CESAREAN SECTION     COLONOSCOPY  03/29/2020   2016    Current Outpatient Medications  Medication Sig Dispense Refill   fluticasone (FLONASE) 50 MCG/ACT nasal spray Place 1 spray into both nostrils 2 (two) times daily. 16 g 0   levonorgestrel (MIRENA) 20 MCG/DAY IUD 1 each by Intrauterine route once.     mesalamine (LIALDA) 1.2 g  EC tablet Take 4 tablets (4.8 g total) by mouth daily with breakfast. 360 tablet 3   No current facility-administered medications for this visit.    Allergies as of 10/18/2022   (No Known Allergies)    Family History  Problem Relation Age of Onset   Other Mother        irreg periods   Sleep apnea Father        on machine   Other Sister        irreg periods   Breast cancer Maternal Aunt    Brain cancer Paternal Aunt    Colon cancer Neg Hx    Throat cancer Neg Hx    Kidney disease Neg Hx    Liver disease Neg Hx    Diabetes Neg Hx    Heart disease Neg Hx    Stomach cancer Neg Hx    Pancreatic cancer Neg Hx     Social History   Socioeconomic History   Marital  status: Married    Spouse name: Not on file   Number of children: 2   Years of education: Not on file   Highest education level: Not on file  Occupational History   Not on file  Tobacco Use   Smoking status: Never   Smokeless tobacco: Never  Vaping Use   Vaping Use: Never used  Substance and Sexual Activity   Alcohol use: Yes    Alcohol/week: 0.0 standard drinks of alcohol    Comment: occasionally   Drug use: No   Sexual activity: Yes  Other Topics Concern   Not on file  Social History Narrative   [employer]    Was Living at home    [facility] of 4   Played basketball in HS no CV pulm signs   Childbirth in January 2012   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 [employer]    5 days per week   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   Not working out of home at this time    NOw living iwht in [place], therapeutic and 4 years okd husband    Small quarters at home all day with 34 yo    To go to pre k if gets accepted  Neg  ocass etoh   Right Handed   Drinks Caffeine    [employer] Home         Social Determinants of Health   Financial Resource Strain: Not on file  Food Insecurity: Not on file  Transportation Needs: Not on file  Physical Activity: Not on file  Stress: Not on file  Social Connections: Not on file  Intimate Partner Violence: Not on file    Review of Systems:    Constitutional: No weight loss, fever or chills Cardiovascular: No chest pain Respiratory: No SOB  Gastrointestinal: See HPI and otherwise negative   Physical Exam:  Vital signs: BP 108/72   Pulse 74   Ht 5' (1.524 m)   Wt 182 lb (82.6 kg)   SpO2 99%   BMI 35.54 kg/m    Constitutional:   Pleasant AA female appears to be in NAD, Well developed, Well nourished, alert and cooperative Respiratory: Respirations even and unlabored. Lungs clear to auscultation bilaterally.   No wheezes, crackles, or rhonchi.  Cardiovascular: Normal S1, S2. No MRG. Regular rate and rhythm. No peripheral edema, cyanosis or pallor.   Gastrointestinal:  Soft, nondistended, mild RLQ ttp No rebound or guarding. Normal bowel sounds. No appreciable  masses or hepatomegaly. Psychiatric:  Demonstrates good judgement and reason without abnormal affect or behaviors.  RELEVANT LABS AND IMAGING: CBC    Component Value Date/Time   WBC 8.2 05/16/2022 1138   RBC 4.71 05/16/2022 1138   HGB 13.0 05/16/2022 1138   HCT 39.7 05/16/2022 1138   PLT 322.0 05/16/2022 1138   MCV 84.5 05/16/2022 1138   MCH 26.7 12/18/2019 1314   MCHC 32.7 05/16/2022 1138   RDW 14.2 05/16/2022 1138   LYMPHSABS 2.5 05/16/2022 1138   MONOABS 0.6 05/16/2022 1138   EOSABS 0.2 05/16/2022 1138   BASOSABS 0.1 05/16/2022 1138    CMP     Component Value Date/Time   NA 139 05/16/2022 1138   K 4.4 05/16/2022 1138   CL 105 05/16/2022 1138   CO2 28 05/16/2022 1138   GLUCOSE 117 (H) 05/16/2022 1138   BUN 7 05/16/2022 1138   CREATININE 0.95 05/16/2022 1138   CREATININE 0.82 06/11/2014 1631   CALCIUM 9.3 05/16/2022 1138   PROT 7.5 05/16/2022 1138   ALBUMIN 4.0 05/16/2022 1138   AST 13 05/16/2022 1138   ALT 11 05/16/2022 1138   ALKPHOS 60 05/16/2022 1138   BILITOT 0.3 05/16/2022 1138   GFRNONAA >60 12/18/2019 1314   GFRAA >60 12/18/2019 1314    Assessment: 1.  Ulcerative proctitis with flare: Patient stopped her medicine for about a month and a half and started back with some rectal bleeding and clots and some tenesmus, bleeding slightly better since restarting Lialda but continues with tenesmus and sometimes passing clots  Plan: 1.  Last colonoscopy in 2021, repeat recommended in 5 years.  Discussed with the patient that if we cannot get her symptoms back under control we may need to move this up to see what is going on.  She verbalized understanding. 2.  For now we will continue her on Lialda 4.8 g daily and add Canasa 1000 mg suppositories nightly for the next 6 to 8 weeks.  Discussed with patient that if symptoms do not get better with suppositories  over the next 2 to 3 weeks she should call and let us know.  Or if the suppositories are too expensive would recommend Hydrocortisone suppositories instead.  This would not be ideal but we could try it. 3.  Discussed how important it is for the patient to stay on her Lialda. 4.  Patient to follow in clinic with me in 2 to 3 months or sooner if necessary.  Hyacinth Meeker, PA-C [facility] 10/18/2022, 9:57 AM  Cc: Madelin Headings, MD

## 2022-12-28 ENCOUNTER — Encounter: Payer: Self-pay | Admitting: Family

## 2022-12-28 ENCOUNTER — Other Ambulatory Visit: Payer: Self-pay | Admitting: Family

## 2022-12-28 ENCOUNTER — Ambulatory Visit: Payer: 59 | Admitting: Family

## 2022-12-28 VITALS — BP 116/72 | HR 100 | Temp 99.9°F | Resp 18 | Ht 60.0 in | Wt 183.2 lb

## 2022-12-28 DIAGNOSIS — R051 Acute cough: Secondary | ICD-10-CM | POA: Diagnosis not present

## 2022-12-28 DIAGNOSIS — J039 Acute tonsillitis, unspecified: Secondary | ICD-10-CM | POA: Diagnosis not present

## 2022-12-28 LAB — POCT INFLUENZA A/B
Influenza A, POC: NEGATIVE
Influenza B, POC: NEGATIVE

## 2022-12-28 LAB — POC COVID19 BINAXNOW: SARS Coronavirus 2 Ag: NEGATIVE

## 2022-12-28 MED ORDER — AMOXICILLIN 875 MG PO TABS: 875.0000 mg | ORAL_TABLET | Freq: Two times a day (BID) | ORAL | 0 refills | Status: AC

## 2022-12-28 NOTE — Progress Notes (Signed)
Cheryl Walters is a 32 y.o. female with the following history as recorded in Epic[employer]:  Patient Active Problem List   Diagnosis Date Noted   Abscess 05/23/2018   IUD (intrauterine device)  09/04/2017   Blood in stool 06/25/2014   Acne vulgaris 06/25/2014   Sleep pattern disturbance 06/11/2014   Muscle spasm of back 03/18/2013   Chronic rhinitis 08/13/2011   Recurrent headache 08/13/2011   Shifting sleep-work schedule 08/13/2011   CHEST PAIN 12/09/2008   IRREGULAR MENSTRUAL CYCLE 07/31/2007   ACNE VULGARIS 07/31/2007    Current Outpatient Medications  Medication Sig Dispense Refill   amoxicillin (AMOXIL) 875 MG tablet Take 1 tablet (875 mg total) by mouth 2 (two) times daily for 10 days. 20 tablet 0   levonorgestrel (MIRENA) 20 MCG/DAY IUD 1 each by Intrauterine route once.     mesalamine (CANASA) 1000 MG suppository Place 1 suppository (1,000 mg total) rectally at bedtime. 56 suppository 0   mesalamine (LIALDA) 1.2 g EC tablet Take 4 tablets (4.8 g total) by mouth daily with breakfast. 360 tablet 3   Prenatal Vit-Fe Fumarate-FA (PRENATAL MULTIVITAMIN) TABS tablet Take 1 tablet by mouth daily at 12 noon.     No current facility-administered medications for this visit.    Allergies: Patient has no known allergies.  Past Medical History:  Diagnosis Date   Acne    Active preterm labor 10/23/2016   Carpal tunnel syndrome of right wrist    Headache(784.0)    Inflammatory bowel diseases (IBD)    Irregular periods    Pilonidal cyst    Ulcerative proctitis (HCC)     Past Surgical History:  Procedure Laterality Date   BIRTH CONTROL IMPLANT     CESAREAN SECTION     COLONOSCOPY  03/29/2020   2016    Family History  Problem Relation Age of Onset   Other Mother        irreg periods   Sleep apnea Father        on machine   Other Sister        irreg periods   Breast cancer Maternal Aunt    Brain cancer Paternal Aunt    Colon cancer Neg Hx    Throat cancer Neg Hx     Kidney disease Neg Hx    Liver disease Neg Hx    Diabetes Neg Hx    Heart disease Neg Hx    Stomach cancer Neg Hx    Pancreatic cancer Neg Hx     Social History   Tobacco Use   Smoking status: Never   Smokeless tobacco: Never  Substance Use Topics   Alcohol use: Yes    Alcohol/week: 0.0 standard drinks of alcohol    Comment: occasionally    Subjective:   Cough/ congestion x 2-3 days; notes that son has similar symptoms; has been running low grade fever but notes that fever last night did get up to 102; no chest pain or shortness of breath;  Took COVID test at home last night- negative;     Objective:  Vitals:   12/28/22 0949  BP: 116/72  Pulse: 100  Resp: 18  Temp: 99.9 F (37.7 C)  TempSrc: Oral  SpO2: 98%  Weight: 183 lb 3.2 oz (83.1 kg)  Height: 5' (1.524 m)    General: Well developed, well nourished, in no acute distress  Skin : Warm and dry.  Head: Normocephalic and atraumatic  Eyes: Sclera and conjunctiva clear; pupils round and reactive to light;  extraocular movements intact  Ears: External normal; canals clear; tympanic membranes normal  Oropharynx: Pink, supple. Pustular lesions noted on right tonsil; tonsils erythematous; Neck: Supple without thyromegaly, + cervical adenopathy  Lungs: Respirations unlabored; clear to auscultation bilaterally without wheeze, rales, rhonchi  CVS exam: normal rate and regular rhythm.  Neurologic: Alert and oriented; speech intact; face symmetrical; moves all extremities well; CNII-XII intact without focal deficit   Assessment:  1. Acute cough   2. Acute tonsillitis, unspecified etiology     Plan:  Rapid COVID and flu are negative; based on appearance of right tonsil, will go ahead and treat with Amoxicillin 875 mg bid x 10 days; continue supportive and symptomatic treatment; increase fluids, rest and follow up with her PCP next week if symptoms persist.  Work note given as requested;   No follow-ups on file.  No orders  of the defined types were placed in this encounter.   Requested Prescriptions   Signed Prescriptions Disp Refills   amoxicillin (AMOXIL) 875 MG tablet 20 tablet 0    Sig: Take 1 tablet (875 mg total) by mouth 2 (two) times daily for 10 days.

## 2022-12-28 NOTE — Addendum Note (Signed)
Addended by: Judieth Keens on: 12/28/2022 01:21 PM   Modules accepted: Orders

## 2023-01-17 ENCOUNTER — Ambulatory Visit: Payer: 59 | Admitting: Physician Assistant

## 2023-03-28 ENCOUNTER — Ambulatory Visit: Payer: 59 | Admitting: Gastroenterology

## 2023-04-19 ENCOUNTER — Other Ambulatory Visit: Payer: Self-pay

## 2023-04-19 ENCOUNTER — Encounter (HOSPITAL_BASED_OUTPATIENT_CLINIC_OR_DEPARTMENT_OTHER): Payer: Self-pay

## 2023-04-19 ENCOUNTER — Emergency Department (HOSPITAL_BASED_OUTPATIENT_CLINIC_OR_DEPARTMENT_OTHER)
Admission: EM | Admit: 2023-04-19 | Discharge: 2023-04-19 | Disposition: A | Discharge status: Home / Self Care | Payer: 59 | Attending: Emergency Medicine | Admitting: Emergency Medicine

## 2023-04-19 ENCOUNTER — Emergency Department (HOSPITAL_BASED_OUTPATIENT_CLINIC_OR_DEPARTMENT_OTHER): Payer: 59

## 2023-04-19 DIAGNOSIS — R079 Chest pain, unspecified: Secondary | ICD-10-CM | POA: Diagnosis present

## 2023-04-19 LAB — COMPREHENSIVE METABOLIC PANEL
ALT: 14 U/L (ref 0–44)
AST: 16 U/L (ref 15–41)
Albumin: 3.7 g/dL (ref 3.5–5.0)
Alkaline Phosphatase: 57 U/L (ref 38–126)
Anion gap: 7 (ref 5–15)
BUN: 8 mg/dL (ref 6–20)
CO2: 27 mmol/L (ref 22–32)
Calcium: 8.6 mg/dL — ABNORMAL LOW (ref 8.9–10.3)
Chloride: 105 mmol/L (ref 98–111)
Creatinine, Ser: 0.83 mg/dL (ref 0.44–1.00)
GFR, Estimated: >60 mL/min (ref >60)
Glucose, Bld: 89 mg/dL (ref 70–99)
Potassium: 3.3 mmol/L — ABNORMAL LOW (ref 3.5–5.1)
Sodium: 139 mmol/L (ref 135–145)
Total Bilirubin: 0.5 mg/dL (ref <1.2)
Total Protein: 7.5 g/dL (ref 6.5–8.1)

## 2023-04-19 LAB — CBC WITH DIFFERENTIAL/PLATELET
Abs Immature Granulocytes: 0.02 10*3/uL (ref 0.00–0.07)
Basophils Absolute: 0.1 10*3/uL (ref 0.0–0.1)
Basophils Relative: 1 %
Eosinophils Absolute: 0.1 10*3/uL (ref 0.0–0.5)
Eosinophils Relative: 1 %
HCT: 39 % (ref 36.0–46.0)
Hemoglobin: 12.9 g/dL (ref 12.0–15.0)
Immature Granulocytes: 0 %
Lymphocytes Relative: 41 %
Lymphs Abs: 2.8 10*3/uL (ref 0.7–4.0)
MCH: 27.7 pg (ref 26.0–34.0)
MCHC: 33.1 g/dL (ref 30.0–36.0)
MCV: 83.7 fL (ref 80.0–100.0)
Monocytes Absolute: 0.5 10*3/uL (ref 0.1–1.0)
Monocytes Relative: 8 %
Neutro Abs: 3.4 10*3/uL (ref 1.7–7.7)
Neutrophils Relative %: 49 %
Platelets: 348 10*3/uL (ref 150–400)
RBC: 4.66 MIL/uL (ref 3.87–5.11)
RDW: 12.8 % (ref 11.5–15.5)
WBC: 6.9 10*3/uL (ref 4.0–10.5)
nRBC: 0 % (ref 0.0–0.2)

## 2023-04-19 LAB — TROPONIN I (HIGH SENSITIVITY)
Troponin I (High Sensitivity): <2 ng/L (ref <18)
Troponin I (High Sensitivity): <2 ng/L (ref <18)

## 2023-04-19 LAB — HCG, SERUM, QUALITATIVE: Preg, Serum: NEGATIVE

## 2023-04-19 MED ORDER — POTASSIUM CHLORIDE CRYS ER 20 MEQ PO TBCR
20.0000 meq | EXTENDED_RELEASE_TABLET | Freq: Once | ORAL | Status: AC
  Administered 2023-04-19: 20 meq via ORAL
  Filled 2023-04-19: qty 1

## 2023-04-19 NOTE — ED Notes (Signed)
Samples in lab for testing

## 2023-04-19 NOTE — ED Triage Notes (Signed)
The patient has been having chest pain on and off for 2 days. No pain at this time. No cough or fever.

## 2023-04-19 NOTE — ED Provider Notes (Signed)
[facility] Provider Note   CSN: [id number] Arrival date & time: 04/19/23  1031     History  Chief Complaint  Patient presents with   Chest Pain    Cheryl Walters is a 32 y.o. female.  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    Sharp pain under left breast, comes and goes for last 2 days, sometimes a few minutes, sometimes 10 minutes 2 days ago had some dyspnea for 10 minutes and that improved No nausea, vomiting, diaphoresis, lightheadedness, abdominal pain, leg pain or swelling  Not exertional, positional or pleuritic  Now not hurting, when it happens is 4-8/10  No smoking, occ etoh, no other drugs No hx of dvt/PE, no family hx of blood clots that she knows of, no known early CAD, grandmother did pass away from heart disease  No long trips, recent surgeries, no immobilization.  Mirena IUD   Has had this before but it has been a while and didn't last this long, no skin lesions in area, no cough or fever   Past Medical History:  Diagnosis Date   Acne    Active preterm labor 10/23/2016   Carpal tunnel syndrome of right wrist    Headache(784.0)    Inflammatory bowel diseases (IBD)    Irregular periods    Pilonidal cyst    Ulcerative proctitis (HCC)      Home Medications Prior to Admission medications   Medication Sig Start Date End Date Taking? Authorizing Provider  levonorgestrel (MIRENA) 20 MCG/DAY IUD 1 each by Intrauterine route once.    [provider]  mesalamine (CANASA) 1000 MG suppository Place 1 suppository (1,000 mg total) rectally at bedtime. 10/18/22   Unk Lightning, PA  mesalamine (LIALDA) 1.2 g EC tablet Take 4 tablets (4.8 g total) by mouth daily with breakfast. 05/16/22   Unk Lightning, PA  Prenatal Vit-Fe Fumarate-FA (PRENATAL MULTIVITAMIN) TABS  tablet Take 1 tablet by mouth daily at 12 noon.    [provider]      Allergies    Patient has no known allergies.    Review of Systems   Review of Systems  Physical Exam Updated Vital Signs BP 104/80   Pulse 60   Temp 98.1 F (36.7 C) (Oral)   Resp 13   Ht 5' (1.524 m)   Wt 81.6 kg   SpO2 99%   BMI 35.15 kg/m  Physical Exam Vitals and nursing note reviewed.  Constitutional:      General: She is not in acute distress.    Appearance: She is well-developed. She is not diaphoretic.  HENT:     Head: Normocephalic and atraumatic.  Eyes:     Conjunctiva/sclera: Conjunctivae normal.  Cardiovascular:     Rate and Rhythm: Normal rate and regular rhythm.     Heart sounds: Normal heart sounds. No murmur heard.    No friction rub. No gallop.  Pulmonary:     Effort: Pulmonary effort is normal. No respiratory distress.     Breath sounds: Normal breath sounds. No wheezing or rales.  Abdominal:     General: There is no distension.     Palpations: Abdomen is soft.     Tenderness: There is no abdominal tenderness. There is no guarding.  Musculoskeletal:        General: No tenderness.  Cervical back: Normal range of motion.  Skin:    General: Skin is warm and dry.     Findings: No erythema or rash.  Neurological:     Mental Status: She is alert and oriented to person, place, and time.     ED Results / Procedures / Treatments   Labs (all labs ordered are listed, but only abnormal results are displayed) Labs Reviewed  COMPREHENSIVE METABOLIC PANEL - Abnormal; Notable for the following components:      Result Value   Potassium 3.3 (*)    Calcium 8.6 (*)    All other components within normal limits  CBC WITH DIFFERENTIAL/PLATELET  HCG, SERUM, QUALITATIVE  TROPONIN I (HIGH SENSITIVITY)  TROPONIN I (HIGH SENSITIVITY)    EKG EKG Interpretation Date/Time:  Friday April 19 2023 10:37:54 EST Ventricular Rate:  69 PR Interval:  152 QRS Duration:  94 QT  Interval:  405 QTC Calculation: 434 R Axis:   47  Text Interpretation: Sinus rhythm No significant change was found Confirmed by Alvira Monday ([id number]) on 04/19/2023 11:37:06 AM  Radiology DG Chest 2 View  Result Date: 04/19/2023 CLINICAL DATA:  Chest pain. EXAM: CHEST - 2 VIEW COMPARISON:  December 18, 2019. FINDINGS: The heart size and mediastinal contours are within normal limits. Both lungs are clear. The visualized skeletal structures are unremarkable. IMPRESSION: No active cardiopulmonary disease. Electronically Signed   By: Lupita Raider M.D.   On: 04/19/2023 11:35    Procedures Procedures    Medications Ordered in ED Medications  potassium chloride SA (KLOR-CON M) CR tablet 20 mEq (20 mEq Oral Given 04/19/23 1429)    ED Course/ Medical Decision Making/ A&P                                     32 year old female with history of hidradenitis suppurativa, ulcerative proctitis who presents with concern for intermittent chest pain over the last 2 days.   Differential diagnosis for chest pain includes pulmonary embolus, dissection, pneumothorax, pneumonia, ACS, myocarditis, pericarditis.    EKG was done and evaluate by me and showed no acute ST changes and no signs of pericarditis.   Chest x-ray was done and evaluated by me and radiology and showed no sign of pneumonia or pneumothorax.   Labs completed and personally evaluated and interpreted by me show no anemia, no leukocytosis, negative pregnancy test, no significant electrolyte changes. Low risk wells, no tachycardia, no asymmetric leg swelling, no hypoxia or tachypnea and have low clinical suspicion for PE.  Patient is low risk HEART score and had delta troponins which were both negative and doubt ACS.  Do not feel history or exam are consistent with aortic dissection. Recommend PCP followup and discussed reasons to return. Patient discharged in stable condition with understanding of reasons to return.    .        Final Clinical Impression(s) / ED Diagnoses Final diagnoses:  Nonspecific chest pain    Rx / DC Orders ED Discharge Orders     None         Alvira Monday, MD 04/19/23 2228

## 2023-04-19 NOTE — ED Notes (Signed)
Pt. Reports chest pain off and on.  Pt. Reports to RN she has pain at present time of 5/10

## 2023-05-31 ENCOUNTER — Other Ambulatory Visit: Payer: Self-pay

## 2023-05-31 MED ORDER — MESALAMINE 1.2 G PO TBEC: 4.8000 g | DELAYED_RELEASE_TABLET | Freq: Every day | ORAL | 0 refills | Status: DC

## 2023-07-02 NOTE — Progress Notes (Unsigned)
Chief Complaint: follow-up Ulcerative colitis Primary GI Doctor:Dr. Lavon Paganini  HPI: Cheryl Walters is a 33 year old female with past medical history as listed below including ulcerative proctitis, known to Dr. Lavon Paganini, who was referred by Panosh, Neta Mends, MD for a complaint of rectal pain.      03/29/2020 colonoscopy with normal mucosa in the sigmoid, descending, transverse and ascending colon.  Terminal ileum appeared normal.  Continues area of ulcerated mucosa with stigmata of recent bleeding in the rectum, nonbleeding internal hemorrhoids medium in size.  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  Repeat recommended in 5 years.    09/13/2021 patient seen in clinic by Dr. Lavon Paganini for ulcerative proctitis.  At that time she was doing well with on average 2-3 bowel movements a day and noticed a decrease in rectal bleeding.  Initial diagnosis of proctitis in 2016 at colonoscopy with Dr. Dickie La with finding of proctitis from 0 to 5 cm diffusely.  Path showed chronic active inflammation with crypt distortion but no granulomas and felt consistent with IBD.  At that time patient's mesalamine was increased to 4.8 g daily and she was advised to start taking a prenatal multivitamin given she had borderline low B12, folate and ferritin.  Told to return in 3 months.    05/16/2022 patient seen in clinic and patient describes doing well.  Continued on Mesalamine 4.8 g daily.  At that time repeated CBC and CMP for medication maintenance.  Told to return to clinic in a year or as needed as needed.    08/24/2022 patient seen by PCP for rectal pain for 3 days and bright red blood per rectum.  At that time she was told that likely this was her ulcerative proctitis.  She was told to restart her mesalamine which apparently she was not taking.    10/18/2022 patient last seen in GI office by Victorino Dike, PA for follow-up.  Patient told Victorino Dike that she had stopped her  Lialda for about a month and a half.  She started to experience bright red blood per rectum and she saw her PCP which instructed her to go back on the mesalamine.  Patient states she has been taking them for over a month however the pills are so large it is difficult sometimes to take them.  Patient reported she was still passing some blood per rectum and would have urge to go to the restroom but nothing comes out.  Also some right lower quadrant abdominal discomfort.  No diarrhea.  Patient was instructed to continue her on Lialda 4.8 g daily and Canasa 1000 mg suppositories nightly was added for the next 6 to 8 weeks.  Patient instructed to follow-up in 2 to 3 months.     04/19/2023 patient seen in [facility] health ED for complaints of chest pain.  Sharp pain under her left breast that comes and goes has lasted 2 days.  Patient also had complaints of shortness of breath.  Chest x-ray showed no sign of pneumonia or pneumothorax.  No anemia or leukocytosis.  Potassium 3.3.  Troponins negative X 2.  Interval History    Patient has history of Ulcerative colitis and returning for follow-up. She is currently taking a mesalamine 1.2 grams 4 tablets po daily. She admits she does forget to take it some days. She has some concerns today about the cost of the medication and asks for alternatives or generic.      Also Victorino Dike prescribed  her suppositories for rectal bleeding at her last appointment in December which she tells me today she only used two doses because she didn't like using them. The symptoms she had have resolved on own. She reports she has one stool per day. Intermittently will have abdominal cramping with eating certain foods. No blood in stool. She does reports joint swelling in her hands for the past several months. She is seeing a specialist whom she said checked her for carpel tunnel and negative. She plans to follow-up again this week. Patient denies nausea, vomiting. Weight stable. Has lost 7 lbs, she  states she has been working a lot. Appetite stable.   Wt Readings from Last 3 Encounters:  07/03/23 173 lb 12.8 oz (78.8 kg)  04/19/23 180 lb (81.6 kg)  12/28/22 183 lb 3.2 oz (83.1 kg)    Past Medical History:  Diagnosis Date   Acne    Active preterm labor 10/23/2016   Carpal tunnel syndrome of right wrist    Headache(784.0)    Inflammatory bowel diseases (IBD)    Irregular periods    Pilonidal cyst    Ulcerative proctitis (HCC)     Past Surgical History:  Procedure Laterality Date   BIRTH CONTROL IMPLANT     CESAREAN SECTION     COLONOSCOPY  03/29/2020   2016    Current Outpatient Medications  Medication Sig Dispense Refill   levonorgestrel (MIRENA) 20 MCG/DAY IUD 1 each by Intrauterine route once.     mesalamine (LIALDA) 1.2 g EC tablet Take 4 tablets (4.8 g total) by mouth daily with breakfast. Please schedule follow up for additional refills. 120 tablet 0   Prenatal Vit-Fe Fumarate-FA (PRENATAL MULTIVITAMIN) TABS tablet Take 1 tablet by mouth daily at 12 noon.     No current facility-administered medications for this visit.    Allergies as of 07/03/2023   (No Known Allergies)    Family History  Problem Relation Age of Onset   Other Mother        irreg periods   Sleep apnea Father        on machine   Other Sister        irreg periods   Breast cancer Maternal Aunt    Brain cancer Paternal Aunt    Colon cancer Neg Hx    Throat cancer Neg Hx    Kidney disease Neg Hx    Liver disease Neg Hx    Diabetes Neg Hx    Heart disease Neg Hx    Stomach cancer Neg Hx    Pancreatic cancer Neg Hx    Review of Systems:    Constitutional: No weight loss, fever, chills, weakness or fatigue HEENT: Eyes: No change in vision               Ears, Nose, Throat:  No change in hearing or congestion Skin: No rash or itching Cardiovascular: No chest pain, chest pressure or palpitations   Respiratory: No SOB or cough Gastrointestinal: See HPI and otherwise  negative Genitourinary: No dysuria or change in urinary frequency Neurological: No headache, dizziness or syncope Musculoskeletal: No new muscle or joint pain Hematologic: No bleeding or bruising Psychiatric: No history of depression or anxiety   Physical Exam:  Vital signs: BP 128/70   Pulse 88   Ht 5' (1.524 m)   Wt 173 lb 12.8 oz (78.8 kg)   SpO2 98%   BMI 33.94 kg/m   Constitutional:   Pleasant female appears to be in NAD,  Well developed, Well nourished, alert and cooperative Throat: Oral cavity and pharynx without inflammation, swelling or lesion.  Respiratory: Respirations even and unlabored. Lungs clear to auscultation bilaterally.   No wheezes, crackles, or rhonchi.  Cardiovascular: Normal S1, S2. Regular rate and rhythm. No peripheral edema, cyanosis or pallor.  Gastrointestinal:  Soft, nondistended, nontender. No rebound or guarding. Normal bowel sounds. No appreciable masses or hepatomegaly. Rectal:  Not performed.  Msk:  Symmetrical without gross deformities. Swelling and pain noted in joints bilateral hands. Neurologic:  Alert and  oriented x4;  grossly normal neurologically.  Skin:   Dry and intact without significant lesions or rashes. Psychiatric: Oriented to person, place and time. Demonstrates good judgement and reason without abnormal affect or behaviors.  RELEVANT LABS AND IMAGING: CBC    Latest Ref Rng & Units 04/19/2023   10:40 AM 05/16/2022   11:38 AM 07/11/2021   10:10 AM  CBC  WBC 4.0 - 10.5 K/uL 6.9  8.2  6.5   Hemoglobin 12.0 - 15.0 g/dL 48.5  46.2  70.3   Hematocrit 36.0 - 46.0 % 39.0  39.7  39.4   Platelets 150 - 400 K/uL 348  322.0  345.0      CMP     Latest Ref Rng & Units 04/19/2023   10:40 AM 05/16/2022   11:38 AM 07/11/2021   10:10 AM  CMP  Glucose 70 - 99 mg/dL 89  500  938   BUN 6 - 20 mg/dL 8  7  9    Creatinine 0.44 - 1.00 mg/dL 1.82  9.93  7.16   Sodium 135 - 145 mmol/L 139  139  139   Potassium 3.5 - 5.1 mmol/L 3.3  4.4  4.1    Chloride 98 - 111 mmol/L 105  105  104   CO2 22 - 32 mmol/L 27  28  29    Calcium 8.9 - 10.3 mg/dL 8.6  9.3  9.1   Total Protein 6.5 - 8.1 g/dL 7.5  7.5  7.5   Total Bilirubin <1.2 mg/dL 0.5  0.3  0.4   Alkaline Phos 38 - 126 U/L 57  60  57   AST 15 - 41 U/L 16  13  16    ALT 0 - 44 U/L 14  11  16      Assessment: Encounter Diagnoses  Name Primary?   Ulcerative proctitis without complication (HCC) Yes   Arthralgia of both hands      33 year old female patient with history of ulcerative proctitis. I reinforced the importance today of taking her medication as prescribed and how suppositories act right on the source of her symptoms if they happen to return. I will see if we have affordable options for her for the mesalamine to lower the cost of the medication or different pharmacy. Due to her recent symptoms and the joint pain I will go ahead and order inflammatory markers to rule out active disease.  Plan: - Check CMP, CBC, CRP, ESR -Check fecal calprotectin  -6 mth fup with Dr. Lavon Paganini or APP  Thank you for the courtesy of this consult. Please call me with any questions or concerns.   Rande Dario, FNP-C [facility] 07/03/2023, 11:53 AM  Cc: Madelin Headings, MD

## 2023-07-03 ENCOUNTER — Other Ambulatory Visit: Payer: 59

## 2023-07-03 ENCOUNTER — Ambulatory Visit (INDEPENDENT_AMBULATORY_CARE_PROVIDER_SITE_OTHER): Payer: 59 | Admitting: Gastroenterology

## 2023-07-03 ENCOUNTER — Encounter: Payer: Self-pay | Admitting: Gastroenterology

## 2023-07-03 VITALS — BP 128/70 | HR 88 | Ht 60.0 in | Wt 173.8 lb

## 2023-07-03 DIAGNOSIS — K5120 Ulcerative (chronic) proctitis without complications: Secondary | ICD-10-CM | POA: Diagnosis not present

## 2023-07-03 DIAGNOSIS — M79642 Pain in left hand: Secondary | ICD-10-CM | POA: Diagnosis not present

## 2023-07-03 DIAGNOSIS — M25541 Pain in joints of right hand: Secondary | ICD-10-CM

## 2023-07-03 DIAGNOSIS — M25542 Pain in joints of left hand: Secondary | ICD-10-CM

## 2023-07-03 DIAGNOSIS — M79641 Pain in right hand: Secondary | ICD-10-CM | POA: Diagnosis not present

## 2023-07-03 LAB — CBC WITH DIFFERENTIAL/PLATELET
Basophils Absolute: 0.1 10*3/uL (ref 0.0–0.1)
Basophils Relative: 0.7 % (ref 0.0–3.0)
Eosinophils Absolute: 0.1 10*3/uL (ref 0.0–0.7)
Eosinophils Relative: 1.8 % (ref 0.0–5.0)
HCT: 39 % (ref 36.0–46.0)
Hemoglobin: 12.8 g/dL (ref 12.0–15.0)
Lymphocytes Relative: 38.2 % (ref 12.0–46.0)
Lymphs Abs: 2.9 10*3/uL (ref 0.7–4.0)
MCHC: 32.8 g/dL (ref 30.0–36.0)
MCV: 85.4 fL (ref 78.0–100.0)
Monocytes Absolute: 0.6 10*3/uL (ref 0.1–1.0)
Monocytes Relative: 7.5 % (ref 3.0–12.0)
Neutro Abs: 4 10*3/uL (ref 1.4–7.7)
Neutrophils Relative %: 51.8 % (ref 43.0–77.0)
Platelets: 330 10*3/uL (ref 150.0–400.0)
RBC: 4.57 Mil/uL (ref 3.87–5.11)
RDW: 13.7 % (ref 11.5–15.5)
WBC: 7.7 10*3/uL (ref 4.0–10.5)

## 2023-07-03 LAB — COMPREHENSIVE METABOLIC PANEL
ALT: 9 U/L (ref 0–35)
AST: 12 U/L (ref 0–37)
Albumin: 4 g/dL (ref 3.5–5.2)
Alkaline Phosphatase: 52 U/L (ref 39–117)
BUN: 10 mg/dL (ref 6–23)
CO2: 26 meq/L (ref 19–32)
Calcium: 8.7 mg/dL (ref 8.4–10.5)
Chloride: 104 meq/L (ref 96–112)
Creatinine, Ser: 0.74 mg/dL (ref 0.40–1.20)
GFR: 105.46 mL/min (ref >60.00)
Glucose, Bld: 94 mg/dL (ref 70–99)
Potassium: 3.7 meq/L (ref 3.5–5.1)
Sodium: 137 meq/L (ref 135–145)
Total Bilirubin: 0.4 mg/dL (ref 0.2–1.2)
Total Protein: 7.4 g/dL (ref 6.0–8.3)

## 2023-07-03 LAB — C-REACTIVE PROTEIN: CRP: <1 mg/dL (ref 0.5–20.0)

## 2023-07-03 NOTE — Patient Instructions (Signed)
Your provider has requested that you go to the basement level for lab work before leaving today. Press "B" on the elevator. The lab is located at the first door on the left as you exit the elevator.  Follow up in 6 months.  Due to recent changes in healthcare laws, you may see the results of your imaging and laboratory studies on MyChart before your provider has had a chance to review them.  We understand that in some cases there may be results that are confusing or concerning to you. Not all laboratory results come back in the same time frame and the provider may be waiting for multiple results in order to interpret others.  Please give [place] 48 hours in order for your provider to thoroughly review all the results before contacting the office for clarification of your results.   It was a pleasure to see you today!  Thank you for trusting me with your gastrointestinal care!

## 2023-07-04 ENCOUNTER — Telehealth: Payer: 59

## 2023-07-04 NOTE — Telephone Encounter (Signed)
-----   Message from Margarite Gouge May sent at 07/03/2023  6:18 PM EST ----- Regarding: RE: medication Lanora Manis the $75 would save her money as she pays over $100 now. Do you want to call and see if she would like to switch pharmacies? Im afraid she will stop taking it if she cannot afford it.  Deanna ----- Message ----- From: Evalee Jefferson, LPN Sent: 0/27/2536  12:54 PM EST To: Margarite Gouge May, NP Subject: RE: medication                                 Unfortunately it is an expensive medication even as generic. The [employer], which is a Energy [facility], has it the least expensive. She could get a month supply of 120 tablets for about $75. That may still be too expensive. Her insurance determines the cost when she gets her prescription form a local pharmacy. ----- Message ----- From: May, Deanna J, NP Sent: 07/03/2023  12:39 PM EST To: Evalee Jefferson, LPN Subject: medication                                     Lanora Manis, Can you find out if there is a generic form of the patients Lialda or a pharmancy that can provide her with a better price for her medication. Like [facility] or mail in order. She reprots 30 day RX is over $100. Let me know what you find out.  Thank you, Deanna

## 2023-07-04 NOTE — Telephone Encounter (Signed)
PT returning call to discuss what RXSS means. Says its a part of her insurance. Please advise.

## 2023-07-04 NOTE — Telephone Encounter (Signed)
Patient advised of this option. She said she would look into this.

## 2023-07-05 ENCOUNTER — Other Ambulatory Visit: Payer: Self-pay

## 2023-07-05 MED ORDER — MESALAMINE 1.2 G PO TBEC: 4.8000 g | DELAYED_RELEASE_TABLET | Freq: Every day | ORAL | 6 refills | Status: AC

## 2023-07-05 MED ORDER — MESALAMINE 1.2 G PO TBEC: 4.8000 g | DELAYED_RELEASE_TABLET | Freq: Every day | ORAL | 6 refills | Status: DC

## 2023-07-05 NOTE — Telephone Encounter (Signed)
Called the patient. She says this is on her insurance card with a telephone number. Advised this is most likely the pharmacy benefits manager and will not be helpful with trying to get a better price for mesalamine.

## 2023-07-17 ENCOUNTER — Other Ambulatory Visit

## 2023-07-23 ENCOUNTER — Ambulatory Visit (INDEPENDENT_AMBULATORY_CARE_PROVIDER_SITE_OTHER): Admitting: Family Medicine

## 2023-07-23 ENCOUNTER — Encounter: Payer: Self-pay | Admitting: Family Medicine

## 2023-07-23 VITALS — BP 102/64 | HR 63 | Temp 97.9°F | Wt 175.0 lb

## 2023-07-23 DIAGNOSIS — M25449 Effusion, unspecified hand: Secondary | ICD-10-CM | POA: Diagnosis not present

## 2023-07-23 DIAGNOSIS — M2550 Pain in unspecified joint: Secondary | ICD-10-CM

## 2023-07-23 NOTE — Progress Notes (Signed)
   Acute Office Visit   Subjective:  Patient ID: Cheryl Walters, female    DOB: 03/07/1991, 33 y.o.   MRN: [id number]  Chief Complaint  Patient presents with   Edema    In hands; has ben going on for a few years saw hand dr and was recommended to have labs done. (Arthritis)    HPI Patient is present to have labs drawn based on the recommendation from her visit with [facility] on 03/04. In the note, it does not specify which labs are recommended.  She was referred to rheumatology with [facility] on 07/09. She has not had an RA or ANA drawn. She had a mildly elevated sed rate on 08/24 and two normal C-reactive protein labs. Last one being on 07/03/2023.   She reports she has been experiencing joint pain in her hands and some swelling in her fingers. This has been going on for a few years.  ROS See HPI above      Objective:   BP 102/64   Pulse 63   Temp 97.9 F (36.6 C)   Wt 175 lb (79.4 kg)   SpO2 98%   BMI 34.18 kg/m    Physical Exam Vitals reviewed.  Constitutional:      General: She is not in acute distress.    Appearance: Normal appearance. She is not ill-appearing, toxic-appearing or diaphoretic.  Eyes:     General:        Right eye: No discharge.        Left eye: No discharge.     Conjunctiva/sclera: Conjunctivae normal.  Cardiovascular:     Rate and Rhythm: Normal rate.  Pulmonary:     Effort: Pulmonary effort is normal. No respiratory distress.  Musculoskeletal:        General: Normal range of motion.  Skin:    General: Skin is warm and dry.  Neurological:     General: No focal deficit present.     Mental Status: She is alert and oriented to person, place, and time. Mental status is at baseline.  Psychiatric:        Mood and Affect: Mood normal.        Behavior: Behavior normal.        Thought Content: Thought content normal.        Judgment: Judgment normal.        Assessment & Plan:  Swelling of hand joint, unspecified laterality -     Rheumatoid factor -     ANA  Arthralgia, unspecified joint -     Rheumatoid factor -     ANA  -Ordered labs (Rheumatoid factor and ANA) to assess for rheumatoid arthritis and a general marker for other auto immune disorders. Office will call with lab results and you will see them on MyChart.  -Encourage you to keep the appointment with rheumatology in July.    Zandra Abts, NP

## 2023-07-23 NOTE — Patient Instructions (Signed)
-  Ordered labs (Rheumatoid factor and ANA) to assess for rheumatoid arthritis and a general marker for other auto immune disorders. Office will call with lab results and you will see them on MyChart.  -Encourage you to keep the appointment with rheumatology in July.

## 2023-07-24 ENCOUNTER — Encounter: Payer: Self-pay | Admitting: Family Medicine

## 2023-07-25 LAB — ANTI-NUCLEAR AB-TITER (ANA TITER): ANA Titer 1: 1:160 {titer} — ABNORMAL HIGH

## 2023-07-25 LAB — RHEUMATOID FACTOR: Rheumatoid fact SerPl-aCnc: <10 [IU]/mL (ref <14)

## 2023-07-25 LAB — ANA: Anti Nuclear Antibody (ANA): POSITIVE — AB

## 2023-09-09 ENCOUNTER — Ambulatory Visit (INDEPENDENT_AMBULATORY_CARE_PROVIDER_SITE_OTHER): Admitting: Family Medicine

## 2023-09-09 ENCOUNTER — Encounter: Payer: Self-pay | Admitting: Family Medicine

## 2023-09-09 VITALS — BP 110/68 | HR 80 | Temp 98.6°F | Ht 60.0 in | Wt 183.4 lb

## 2023-09-09 DIAGNOSIS — J069 Acute upper respiratory infection, unspecified: Secondary | ICD-10-CM | POA: Diagnosis not present

## 2023-09-09 DIAGNOSIS — M7661 Achilles tendinitis, right leg: Secondary | ICD-10-CM

## 2023-09-09 DIAGNOSIS — J029 Acute pharyngitis, unspecified: Secondary | ICD-10-CM

## 2023-09-09 DIAGNOSIS — R051 Acute cough: Secondary | ICD-10-CM

## 2023-09-09 LAB — POC COVID19 BINAXNOW: SARS Coronavirus 2 Ag: NEGATIVE

## 2023-09-09 MED ORDER — BENZONATATE 100 MG PO CAPS: 100.0000 mg | ORAL_CAPSULE | Freq: Two times a day (BID) | ORAL | 0 refills | Status: DC | PRN

## 2023-09-09 NOTE — Patient Instructions (Addendum)
 Your COVID test was negative.  As symptoms just started today consider retesting in a few days if they continue/become worse.  A prescription for Tessalon  sent to your pharmacy.  This is a medication for cough.  You can take over-the-counter cough/cold medications as well as gargle with warm salt water or Chloraseptic spray for your throat.  You can take Tylenol  or ibuprofen  as needed

## 2023-09-09 NOTE — Progress Notes (Signed)
 Established Patient Office Visit   Subjective  Patient ID: Cheryl Walters, female    DOB: 1990/05/18  Age: 33 y.o. MRN: [id number]  Chief Complaint  Patient presents with   Sore Throat    Started 1 day ago, sore throat, cough, dizziness, chills    Ankle Pain    Started 3 days ago, right ankle pain,     Patient is a 33 year old female followed by Dr. Ethel Henry and seen for acute concern.  Patient endorses waking up with sore throat, cough, dizziness, headache, chills x 1 day.  Denies rhinorrhea, ear pain/pressure, facial pain/pressure, nausea, vomiting, decreased appetite, loose stools, sick contacts.  Patient is yet to take anything for symptoms.  Return from a trip to [place] a little over a week ago.  Patient mentions posterior right ankle feeling sore x 2 days.  Denies any increased activity or injury.  Wears slides or nonslip hey dudes shoes at work.    Patient Active Problem List   Diagnosis Date Noted   Abscess 05/23/2018   IUD (intrauterine device)  09/04/2017   Blood in stool 06/25/2014   Acne vulgaris 06/25/2014   Sleep pattern disturbance 06/11/2014   Muscle spasm of back 03/18/2013   Chronic rhinitis 08/13/2011   Recurrent headache 08/13/2011   Shifting sleep-work schedule 08/13/2011   CHEST PAIN 12/09/2008   IRREGULAR MENSTRUAL CYCLE 07/31/2007   ACNE VULGARIS 07/31/2007   Past Medical History:  Diagnosis Date   Acne    Active preterm labor 10/23/2016   Carpal tunnel syndrome of right wrist    Headache(784.0)    Inflammatory bowel diseases (IBD)    Irregular periods    Pilonidal cyst    Ulcerative proctitis (HCC)    Past Surgical History:  Procedure Laterality Date   BIRTH CONTROL IMPLANT     CESAREAN SECTION     COLONOSCOPY  03/29/2020   2016   Social History   Tobacco Use   Smoking status: Never   Smokeless tobacco: Never  Vaping Use   Vaping status: Never Used  Substance Use Topics   Alcohol use: Yes    Alcohol/week: 0.0 standard drinks  of alcohol    Comment: occasionally   Drug use: No   Family History  Problem Relation Age of Onset   Other Mother        irreg periods   Sleep apnea Father        on machine   Other Sister        irreg periods   Breast cancer Maternal Aunt    Brain cancer Paternal Aunt    Colon cancer Neg Hx    Throat cancer Neg Hx    Kidney disease Neg Hx    Liver disease Neg Hx    Diabetes Neg Hx    Heart disease Neg Hx    Stomach cancer Neg Hx    Pancreatic cancer Neg Hx    No Known Allergies    ROS Negative unless stated above    Objective:     BP 110/68 (BP Location: Right Arm, Patient Position: Sitting, Cuff Size: Normal)   Pulse 80   Temp 98.6 F (37 C) (Oral)   Ht 5' (1.524 m)   Wt 183 lb 6.4 oz (83.2 kg)   LMP  (LMP Unknown)   SpO2 98%   BMI 35.82 kg/m  BP Readings from Last 3 Encounters:  09/09/23 110/68  07/23/23 102/64  07/03/23 128/70   Wt Readings from Last 3 Encounters:  09/09/23 183 lb 6.4 oz (83.2 kg)  07/23/23 175 lb (79.4 kg)  07/03/23 173 lb 12.8 oz (78.8 kg)      Physical Exam Constitutional:      General: She is not in acute distress.    Appearance: Normal appearance.  HENT:     Head: Normocephalic and atraumatic.     Nose: Nose normal.     Mouth/Throat:     Mouth: Mucous membranes are moist.     Comments: Mallampati 3. Cardiovascular:     Rate and Rhythm: Normal rate and regular rhythm.     Heart sounds: Normal heart sounds. No murmur heard.    No gallop.  Pulmonary:     Effort: Pulmonary effort is normal. No respiratory distress.     Breath sounds: Normal breath sounds. No wheezing, rhonchi or rales.  Musculoskeletal:       Legs:     Comments: Discomfort posterior right ankle superior to insertion of Achilles tendon.  No TTP.  Achilles tendon feels thicker than left.  Skin:    General: Skin is warm and dry.  Neurological:     Mental Status: She is alert and oriented to person, place, and time.    Results for orders placed or  performed in visit on 09/09/23  POC COVID-19 BinaxNow  Result Value Ref Range   SARS Coronavirus 2 Ag Negative Negative      Assessment & Plan:  Viral URI  Acute cough -     POC COVID-19 BinaxNow -     Benzonatate ; Take 1 capsule (100 mg total) by mouth 2 (two) times daily as needed for cough.  Dispense: 20 capsule; Refill: 0  Sore throat -     POC COVID-19 BinaxNow  Achilles tendinitis of right lower extremity  Patient with acute URI symptoms likely viral in etiology.  POC COVID testing negative.  Supportive care.  Rx for Tessalon  sent to pharmacy.  Given strict precautions.  Given note for work.  Tendinitis of right Achilles tendon.  Supportive care including ice, stretching, Tylenol  or NSAIDs as needed, supportive shoes.  Given handout.  Return if symptoms worsen or fail to improve.   Viola Greulich, MD

## 2023-09-11 ENCOUNTER — Ambulatory Visit (INDEPENDENT_AMBULATORY_CARE_PROVIDER_SITE_OTHER): Admitting: Family Medicine

## 2023-09-11 ENCOUNTER — Encounter: Payer: Self-pay | Admitting: Family Medicine

## 2023-09-11 VITALS — BP 110/68 | HR 90 | Temp 98.5°F | Ht 60.0 in | Wt 173.8 lb

## 2023-09-11 DIAGNOSIS — J029 Acute pharyngitis, unspecified: Secondary | ICD-10-CM

## 2023-09-11 DIAGNOSIS — R0683 Snoring: Secondary | ICD-10-CM | POA: Diagnosis not present

## 2023-09-11 NOTE — Progress Notes (Signed)
 Established Patient Office Visit   Subjective  Patient ID: Cheryl Walters, female    DOB: May 07, 1991  Age: 33 y.o. MRN: [id number]  Chief Complaint  Patient presents with   Sore Throat    Patient is a 33 year old female followed by Dr. Ethel Henry is seen for ongoing concern.  Patient seen 2 days ago for acute URI symptoms that had just started.  POC COVID test was negative.  Patient with continued soreness in throat and sharp pain in right upper chest with drinking or eating.  Throat feels more sore first thing in the morning and gradually improves.  Mouth and throat feels dry in morning.  Patient endorses history of snoring/mouth being open at night.  Patient states throat is not as sore as when you have strep throat.  Patient denies fever, chills, headache, nausea, vomiting, GERD.    Patient Active Problem List   Diagnosis Date Noted   Abscess 05/23/2018   IUD (intrauterine device)  09/04/2017   Blood in stool 06/25/2014   Acne vulgaris 06/25/2014   Sleep pattern disturbance 06/11/2014   Muscle spasm of back 03/18/2013   Chronic rhinitis 08/13/2011   Recurrent headache 08/13/2011   Shifting sleep-work schedule 08/13/2011   CHEST PAIN 12/09/2008   IRREGULAR MENSTRUAL CYCLE 07/31/2007   ACNE VULGARIS 07/31/2007   Past Medical History:  Diagnosis Date   Acne    Active preterm labor 10/23/2016   Carpal tunnel syndrome of right wrist    Headache(784.0)    Inflammatory bowel diseases (IBD)    Irregular periods    Pilonidal cyst    Ulcerative proctitis (HCC)    Past Surgical History:  Procedure Laterality Date   BIRTH CONTROL IMPLANT     CESAREAN SECTION     COLONOSCOPY  03/29/2020   2016   Social History   Tobacco Use   Smoking status: Never   Smokeless tobacco: Never  Vaping Use   Vaping status: Never Used  Substance Use Topics   Alcohol use: Yes    Alcohol/week: 0.0 standard drinks of alcohol    Comment: occasionally   Drug use: No   Family History   Problem Relation Age of Onset   Other Mother        irreg periods   Sleep apnea Father        on machine   Other Sister        irreg periods   Breast cancer Maternal Aunt    Brain cancer Paternal Aunt    Colon cancer Neg Hx    Throat cancer Neg Hx    Kidney disease Neg Hx    Liver disease Neg Hx    Diabetes Neg Hx    Heart disease Neg Hx    Stomach cancer Neg Hx    Pancreatic cancer Neg Hx    No Known Allergies    ROS Negative unless stated above    Objective:     BP 110/68 (BP Location: Left Arm, Patient Position: Sitting, Cuff Size: Normal)   Pulse 90   Temp 98.5 F (36.9 C) (Oral)   Ht 5' (1.524 m)   Wt 173 lb 12.8 oz (78.8 kg)   LMP  (LMP Unknown)   SpO2 97%   BMI 33.94 kg/m    Physical Exam Constitutional:      General: She is not in acute distress.    Appearance: Normal appearance.  HENT:     Head: Normocephalic and atraumatic.     Nose: Nose normal.  Mouth/Throat:     Mouth: Mucous membranes are moist.     Pharynx: Oropharynx is clear. Uvula midline.     Tonsils: No tonsillar exudate or tonsillar abscesses.     Comments: Mallampati 3. Cardiovascular:     Rate and Rhythm: Normal rate and regular rhythm.     Heart sounds: Normal heart sounds. No murmur heard.    No gallop.  Pulmonary:     Effort: Pulmonary effort is normal. No respiratory distress.     Breath sounds: Normal breath sounds. No wheezing, rhonchi or rales.  Skin:    General: Skin is warm and dry.  Neurological:     Mental Status: She is alert and oriented to person, place, and time.      No results found for any visits on 09/11/23.    Assessment & Plan:  Snoring -     Pulmonary Visit  Sore throat -     Pulmonary Visit  Patient with history of snoring and sore, dry, irritated throat.  Throat symptoms likely 2/2 mouth being open while snoring.  Also consider continued URI symptoms given recent visit, GERD, seasonal allergies.  Continue supportive care.  Consider OSA.   Referral placed for sleep study.  Return if symptoms worsen or fail to improve.   Viola Greulich, MD

## 2023-09-18 ENCOUNTER — Ambulatory Visit (INDEPENDENT_AMBULATORY_CARE_PROVIDER_SITE_OTHER): Admitting: Family Medicine

## 2023-09-18 ENCOUNTER — Encounter: Payer: Self-pay | Admitting: Family Medicine

## 2023-09-18 ENCOUNTER — Ambulatory Visit: Payer: Self-pay

## 2023-09-18 VITALS — BP 110/80 | HR 75 | Temp 98.2°F | Wt 180.0 lb

## 2023-09-18 DIAGNOSIS — M7989 Other specified soft tissue disorders: Secondary | ICD-10-CM | POA: Insufficient documentation

## 2023-09-18 MED ORDER — METHYLPREDNISOLONE 4 MG PO TBPK: ORAL_TABLET | ORAL | 0 refills | Status: DC

## 2023-09-18 NOTE — Telephone Encounter (Signed)
  Chief Complaint: Finger and hand swelling Symptoms: bilateral finger and hand swelling Frequency: worsened in the last couple of weeks Pertinent Negatives: Patient denies CP, SOB Disposition: [] ED /[] Urgent Care (no appt availability in office) / [x] Appointment(In office/virtual)/ []  [place] [facility]/ [] Home Care/ [] Refused Recommended Disposition /[] [place] [facility]/ []  Follow-up with PCP Additional Notes: patient called with concerns for increased finger and hand swelling. Patient is scheduled for rhuematology appointment to be seen as a new patient in July 2025. Patient endorses increased swelling in the morning that will eventually decrease but patient states swelling will worsen doing her job. Patient works for [employer] and uses her hands consistently. Per protocol, patient is recommended to be seen within 24 hours but patient requested a same day appointment. Appointment scheduled for today at 3:30 PM with another provider. Patient verbalized understanding and all questions answered.    Copied from CRM [id number]. Topic: Clinical - Red Word Triage >> Sep 18, 2023 12:36 PM Kita Perish H wrote: [facility] that prompted transfer to Nurse Triage: Patient states her hands are swollen now to the point she can't close her hands. Reason for Disposition  [1] MILD swelling (puffiness) of both hands AND [2] new-onset or worsening  (Exception: Caused by hot weather or normal pregnancy swelling.)  Answer Assessment - Initial Assessment Questions 1. ONSET: "When did the swelling start?" (e.g., minutes, hours, days)     Hand swelling has been going on for awhile. Worse in the morning  2. LOCATION: "What part of the hand is swollen?"  "Are both hands swollen or just one hand?"     Fingers on both hands 3. SEVERITY: "How bad is the swelling?" (e.g., localized; mild, moderate, severe)   - BALL OR LUMP: small ball or lump   - LOCALIZED: puffy or swollen area or patch of skin   - JOINT SWELLING:  swelling of a joint   - MILD: puffiness or mild swelling of fingers or hand   - MODERATE: fingers and hand are swollen   - SEVERE: swelling of entire hand and up into forearm     Mild 4. REDNESS: "Does the swelling look red or infected?"     no 5. PAIN: "Is the swelling painful to touch?" If Yes, ask: "How painful is it?"   (Scale 1-10; mild, moderate or severe)     No pain 6. FEVER: "Do you have a fever?" If Yes, ask: "What is it, how was it measured, and when did it start?"      no 7. CAUSE: "What do you think is causing the hand swelling?" (e.g., heat, insect bite, pregnancy, recent injury)     Awaiting rheumatology appointment 8. MEDICAL HISTORY: "Do you have a history of heart failure, kidney disease, liver failure, or cancer?"     no 9. RECURRENT SYMPTOM: "Have you had hand swelling before?" If Yes, ask: "When was the last time?" "What happened that time?"     Right hand swelling has been going on for a few years and left hand started recently.  10. OTHER SYMPTOMS: "Do you have any other symptoms?" (e.g., blurred vision, difficulty breathing, headache)       no 11. PREGNANCY: "Is there any chance you are pregnant?" "When was your last menstrual period?"       no  Protocols used: Hand Swelling-A-AH

## 2023-09-18 NOTE — Progress Notes (Signed)
   Subjective:    Patient ID: Cheryl Walters, female    DOB: 05-25-1990, 33 y.o.   MRN: [id number]  HPI Here for advice about her hands. About a year ago she developed swelling and occasional pain in both hands. This mostly involves the fingers and not the wrist. She has taken Ibuprofen  at times with no relief. 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 She had labs drawn on 07-23-23 that showed a negative RF and a positive ANA of 1:160 with a homogeneous nuclear pattern. She is scheduled to see Dr. Shelton Dibbles of [facility] on 11-20-23.    Review of Systems  Constitutional: Negative.   Respiratory: Negative.    Cardiovascular: Negative.   Musculoskeletal:  Positive for arthralgias and joint swelling.       Objective:   Physical Exam Constitutional:      Appearance: Normal appearance.  Cardiovascular:     Rate and Rhythm: Normal rate and regular rhythm.     Pulses: Normal pulses.     Heart sounds: Normal heart sounds.  Pulmonary:     Effort: Pulmonary effort is normal.     Breath sounds: Normal breath sounds.  Musculoskeletal:     Comments: Her hands appear normal with no swelling or warmth or tenderness (she says today is one of her "good days").   Neurological:     Mental Status: She is alert.           Assessment & Plan:  Arthralgia of the hands, likely due to some sort of autoimmune disorder like RA or lupus. We will treat her with a Medrol dose pack to give her some relief. She will see Rheumatology as above. Corita Diego, MD

## 2023-09-27 ENCOUNTER — Ambulatory Visit: Admitting: Sleep Medicine

## 2023-09-27 ENCOUNTER — Encounter: Payer: Self-pay | Admitting: Sleep Medicine

## 2023-09-27 VITALS — BP 116/70 | HR 72 | Temp 98.6°F | Ht 60.0 in | Wt 179.6 lb

## 2023-09-27 DIAGNOSIS — F5104 Psychophysiologic insomnia: Secondary | ICD-10-CM

## 2023-09-27 DIAGNOSIS — E669 Obesity, unspecified: Secondary | ICD-10-CM

## 2023-09-27 DIAGNOSIS — Z6835 Body mass index (BMI) 35.0-35.9, adult: Secondary | ICD-10-CM

## 2023-09-27 DIAGNOSIS — G4733 Obstructive sleep apnea (adult) (pediatric): Secondary | ICD-10-CM | POA: Diagnosis not present

## 2023-09-27 DIAGNOSIS — G4700 Insomnia, unspecified: Secondary | ICD-10-CM | POA: Diagnosis not present

## 2023-09-27 MED ORDER — TIRZEPATIDE-WEIGHT MANAGEMENT 2.5 MG/0.5ML ~~LOC~~ SOLN: 2.5000 mg | SUBCUTANEOUS | Status: DC

## 2023-09-27 NOTE — Progress Notes (Signed)
 Name:Cheryl Walters MRN: [id number] DOB: [birth year]   CHIEF COMPLAINT:  EXCESSIVE DAYTIME SLEEPINESS   HISTORY OF PRESENT ILLNESS:  Cheryl Walters is a 33 y.o. w/ a h/o obesity who presents for c/o loud snoring and witnessed apnea which has been present for several months. Reports nocturnal awakenings due to unclear reasons, however does not have difficulty falling back to sleep. Denies any significant weight changes. Admits to morning headaches. Denies RLS symptoms, dream enactment, cataplexy, hypnagogic or hypnapompic hallucinations. Reports a family history of sleep apnea. Denies drowsy driving. Drinks 2-3 sodas daily, occasional alcohol use, denies tobacco or illicit drug use. Patient denies a family history or personal history of medullary thyroid CA or thyroid disease. Denies a h/o pancreatitis.   Bedtime 9 pm- 1 am Sleep onset 2-4 hours Rise time 6:45- 11 am   EPWORTH SLEEP SCORE 6    09/27/2023   10:00 AM  Results of the Epworth flowsheet  Sitting and reading 2  Watching TV 1  Sitting, inactive in a public place (e.g. a theatre or a meeting) 0  As a passenger in a car for an hour without a break 1  Lying down to rest in the afternoon when circumstances permit 2  Sitting and talking to someone 0  Sitting quietly after a lunch without alcohol 0  In a car, while stopped for a few minutes in traffic 0  Total score 6    PAST MEDICAL HISTORY :   has a past medical history of Acne, Active preterm labor (10/23/2016), Carpal tunnel syndrome of right wrist, Headache(784.0), Inflammatory bowel diseases (IBD), Irregular periods, Pilonidal cyst, and Ulcerative proctitis (HCC).  has a past surgical history that includes Cesarean section; BIRTH CONTROL IMPLANT; and Colonoscopy (03/29/2020). Prior to Admission medications   Medication Sig Start Date End Date Taking? Authorizing Provider  levonorgestrel  (MIRENA ) 20 MCG/DAY IUD 1 each by Intrauterine route once.   Yes [provider]  mesalamine  (LIALDA ) 1.2 g EC tablet Take 4 tablets (4.8 g total) by mouth daily with breakfast. 07/05/23  Yes Nandigam, Kavitha V, MD  Prenatal Vit-Fe Fumarate-FA (PRENATAL MULTIVITAMIN) TABS tablet Take 1 tablet by mouth daily at 12 noon.   Yes [provider]  benzonatate  (TESSALON ) 100 MG capsule Take 1 capsule (100 mg total) by mouth 2 (two) times daily as needed for cough. Patient not taking: Reported on 09/27/2023 09/09/23   Viola Greulich, MD  diclofenac Sodium (VOLTAREN) 1 % GEL Apply 2 g topically 4 (four) times daily. Patient not taking: Reported on 09/27/2023 10/01/22   [provider]  methylPREDNISolone  (MEDROL  DOSEPAK) 4 MG TBPK tablet As directed Patient not taking: Reported on 09/27/2023 09/18/23   Donley Furth, MD   No Known Allergies  FAMILY HISTORY:  family history includes Brain cancer in her paternal aunt; Breast cancer in her maternal aunt; Other in her mother and sister; Sleep apnea in her father. SOCIAL HISTORY:  reports that she has never smoked. She has never used smokeless tobacco. She reports current alcohol use. She reports that she does not use drugs.   Review of Systems:  Gen:  Denies  fever, sweats, chills weight loss  HEENT: Denies blurred vision, double vision, ear pain, eye pain, hearing loss, nose bleeds, sore throat Cardiac:  No dizziness, chest pain or heaviness, chest tightness,edema, No JVD Resp:   No cough, -sputum production, -shortness of breath,-wheezing, -hemoptysis,  Gi: Denies swallowing difficulty, stomach pain, nausea or vomiting, diarrhea, constipation,  bowel incontinence Gu:  Denies bladder incontinence, burning urine Ext:   Denies Joint pain, stiffness or swelling Skin: Denies  skin rash, easy bruising or bleeding or hives Endoc:  Denies polyuria, polydipsia , polyphagia or weight change Psych:   Denies depression, insomnia or hallucinations  Other:  All other systems negative  VITAL SIGNS: BP 116/70  (BP Location: Right Arm, Patient Position: Sitting, Cuff Size: Normal)   Pulse 72   Temp 98.6 F (37 C) (Oral)   Ht 5' (1.524 m)   Wt 179 lb 9.6 oz (81.5 kg)   LMP  (LMP Unknown)   SpO2 99%   BMI 35.08 kg/m    Physical Examination:   General Appearance: No distress  EYES PERRLA, EOM intact.   NECK Supple, No JVD Pulmonary: normal breath sounds, No wheezing.  CardiovascularNormal S1,S2.  No m/r/g.   Abdomen: Benign, Soft, non-tender. Skin:   warm, no rashes, no ecchymosis  Extremities: normal, no cyanosis, clubbing. Neuro:without focal findings,  speech normal  PSYCHIATRIC: Mood, affect within normal limits.   ASSESSMENT AND PLAN  OSA I suspect that OSA is likely present due to clinical presentation. Discussed the consequences of untreated sleep apnea. Advised not to drive drowsy for safety of patient and others. Will complete further evaluation with a home sleep study and follow up to review results.    Obesity Counseled patient on diet and lifestyle modification. Will also try patient on Zepbound for weight loss.   Insomnia Counseled patient on stimulus control and improving sleep hygiene practices.   MEDICATION ADJUSTMENTS/LABS AND TESTS ORDERED: Recommend Sleep Study   Patient  satisfied with Plan of action and management. All questions answered  Follow up to review HST results and treatment plan.   I spent a total of 32 minutes reviewing chart data, face-to-face evaluation with the patient, counseling and coordination of care as detailed above.    Siraj Dermody, M.D.  Sleep Medicine [facility]

## 2023-09-27 NOTE — Patient Instructions (Signed)
 Cheryl Walters

## 2023-10-20 ENCOUNTER — Encounter

## 2023-10-20 DIAGNOSIS — G4733 Obstructive sleep apnea (adult) (pediatric): Secondary | ICD-10-CM

## 2023-10-25 ENCOUNTER — Encounter: Payer: Self-pay | Admitting: Family Medicine

## 2023-10-25 ENCOUNTER — Ambulatory Visit (INDEPENDENT_AMBULATORY_CARE_PROVIDER_SITE_OTHER): Admitting: Family Medicine

## 2023-10-25 VITALS — BP 100/72 | HR 62 | Temp 98.3°F | Ht 60.0 in | Wt 180.4 lb

## 2023-10-25 DIAGNOSIS — M542 Cervicalgia: Secondary | ICD-10-CM

## 2023-10-25 DIAGNOSIS — R519 Headache, unspecified: Secondary | ICD-10-CM

## 2023-10-25 DIAGNOSIS — G4733 Obstructive sleep apnea (adult) (pediatric): Secondary | ICD-10-CM | POA: Diagnosis not present

## 2023-10-25 MED ORDER — PREDNISONE 10 MG PO TABS: ORAL_TABLET | ORAL | 0 refills | Status: AC

## 2023-10-25 MED ORDER — TIRZEPATIDE-WEIGHT MANAGEMENT 2.5 MG/0.5ML ~~LOC~~ SOLN: 2.5000 mg | SUBCUTANEOUS | Status: DC

## 2023-10-25 NOTE — Patient Instructions (Signed)
-  Prescribed Prednisone  10mg , 6 day taper for cervical pain and headache. -Follow up if not improved.  -I hope you get to feeling better soon.

## 2023-10-25 NOTE — Progress Notes (Signed)
 Acute Office Visit   Subjective:  Patient ID: Cheryl Walters, female    DOB: [birth year], 33 y.o.   MRN: [id number]  Chief Complaint  Patient presents with   Headache    Pt c/o headache wholehead. Started on Wednesday. Took excedri migrain yesterday, and ibuprofen  on Wednesday.    Neck Pain    Pt c/o neck pain going on for a wk.     HPI Patient is complaining of headache and cervical pain. She reports the cervical pain started about 1-2 weeks ago. She reports the pain started gradually, more stuff. Described as sharp, constant pain. Pain does not radiate. Denies numbness or tingling.   Headache started on Wednesday. Present yesterday, but not today. She reports her headache was located all over. Described pain as constant, aching pain. Reported a little dizziness, but nausea, vomiting, or blurry vision.   She has took Ibuprofen  on Wednesday and yesterday took Excedrin. This has help the pain.  ROS See HPI above      Objective:   BP 100/72 (BP Location: Left Arm, Patient Position: Sitting, Cuff Size: Large)   Pulse 62   Temp 98.3 F (36.8 C) (Oral)   Ht 5' (1.524 m)   Wt 180 lb 6.4 oz (81.8 kg)   SpO2 98%   BMI 35.23 kg/m    Physical Exam Vitals reviewed.  Constitutional:      General: She is not in acute distress.    Appearance: Normal appearance. She is not ill-appearing, toxic-appearing or diaphoretic.  HENT:     Head: Normocephalic and atraumatic.   Eyes:     General:        Right eye: No discharge.        Left eye: No discharge.     Extraocular Movements: Extraocular movements intact.     Right eye: Normal extraocular motion and no nystagmus.     Left eye: Normal extraocular motion and no nystagmus.     Conjunctiva/sclera: Conjunctivae normal.     Pupils: Pupils are equal, round, and reactive to light.    Cardiovascular:     Rate and Rhythm: Normal rate and regular rhythm.     Heart sounds: Normal heart sounds. No murmur heard.    No friction rub.  No gallop.  Pulmonary:     Effort: Pulmonary effort is normal. No respiratory distress.     Breath sounds: Normal breath sounds.   Musculoskeletal:        General: Normal range of motion.     Cervical back: No swelling, edema, deformity, erythema or rigidity. Pain with movement (When turning head to left side.) present. Normal range of motion.  Lymphadenopathy:     Cervical: No cervical adenopathy.   Skin:    General: Skin is warm and dry.   Neurological:     General: No focal deficit present.     Mental Status: She is alert and oriented to person, place, and time. Mental status is at baseline.     GCS: GCS eye subscore is 4. GCS verbal subscore is 5. GCS motor subscore is 6.     Cranial Nerves: Cranial nerves 2-12 are intact. No facial asymmetry.     Motor: No weakness.   Psychiatric:        Mood and Affect: Mood normal.        Speech: Speech normal.        Behavior: Behavior normal.        Thought Content: Thought content normal.  Judgment: Judgment normal.       Assessment & Plan:  Nonintractable headache, unspecified chronicity pattern, unspecified headache type -     predniSONE ; Take 6 tablets (60 mg total) by mouth daily with breakfast for 1 day, THEN 5 tablets (50 mg total) daily with breakfast for 1 day, THEN 4 tablets (40 mg total) daily with breakfast for 1 day, THEN 3 tablets (30 mg total) daily with breakfast for 1 day, THEN 2 tablets (20 mg total) daily with breakfast for 1 day, THEN 1 tablet (10 mg total) daily with breakfast for 1 day.  Dispense: 21 tablet; Refill: 0  OSA (obstructive sleep apnea)  Cervical pain -     predniSONE ; Take 6 tablets (60 mg total) by mouth daily with breakfast for 1 day, THEN 5 tablets (50 mg total) daily with breakfast for 1 day, THEN 4 tablets (40 mg total) daily with breakfast for 1 day, THEN 3 tablets (30 mg total) daily with breakfast for 1 day, THEN 2 tablets (20 mg total) daily with breakfast for 1 day, THEN 1 tablet (10 mg  total) daily with breakfast for 1 day.  Dispense: 21 tablet; Refill: 0  -Prescribed Prednisone  10mg , 6 day taper for cervical pain and headache. -Follow up if not improved.   *Patient initially reported she does not take Zepbound  but then wanted it back on her chart. Placed back on her chart.  No follow-ups on file.  Yanis Larin, NP

## 2023-11-04 ENCOUNTER — Telehealth: Payer: Self-pay

## 2023-11-04 NOTE — Telephone Encounter (Signed)
 Copied from CRM [id number]. Topic: Clinical - Lab/Test Results >> Nov 04, 2023  4:10 PM Rozanna G wrote: Reason for CRM: PT CALLED TO SEE IF WE RECEIVED HER HOME SLEEP STUDY ADVISED NO RECORD YET MAILED ON 06/17 AND TRACKING STATED IT WOULD BE DELIVERED ON YESTERDAY ADVISED IT PROBABLY BE TODAY. ALSO STATED NEEDS RESULTS FOR HER JOB AND WOULD LIKE THIS TO BE DONE ASAP. ONCE IT IT RECEIVED PLEASE CONTACT PT. THANKS

## 2023-11-06 NOTE — Telephone Encounter (Unsigned)
 Copied from CRM [id number]. Topic: Clinical - Lab/Test Results >> Nov 06, 2023  1:50 PM Rilla B wrote: Reason for CRM:  Patient would like a call regarding her sleep study results for her job. Please call patient. She is eager to get started with training and cannot start with out results. Machine was receive this morning at 8am. Please call.

## 2023-11-06 NOTE — Telephone Encounter (Signed)
 Spoke with pt, she cannot continue CDL class without the finalized results of her sleep study submitted.I told her we are still waiting for the study to be read and for Dr. Jess to review them. Dr. Jess will not be back in office until 11/12/23 and told pt I will encourage her to process study on that date.

## 2023-11-12 DIAGNOSIS — G4730 Sleep apnea, unspecified: Secondary | ICD-10-CM | POA: Diagnosis not present

## 2023-11-12 NOTE — Telephone Encounter (Signed)
 Patient advised. She does not want to do the in-lab. She does need a copy of her sleep study. Please advise patient when this has been scanned.

## 2023-11-13 NOTE — Telephone Encounter (Signed)
 Sleep Study was scanned into [employer] 11/12/23. I'm not sure whether she can see it or not

## 2023-11-13 NOTE — Telephone Encounter (Signed)
 Patient confirmed that she was able to view sleep report. NFN.
